# Patient Record
Sex: Male | Born: 1935 | Race: White | Hispanic: No | Smoking: Former smoker
Health system: Southern US, Community
[De-identification: ages and names within clinical notes are randomized; demographics above are authoritative.]

## PROBLEM LIST (undated history)

## (undated) DIAGNOSIS — I252 Old myocardial infarction: Secondary | ICD-10-CM

## (undated) DIAGNOSIS — I679 Cerebrovascular disease, unspecified: Secondary | ICD-10-CM

## (undated) DIAGNOSIS — I10 Essential (primary) hypertension: Secondary | ICD-10-CM

## (undated) DIAGNOSIS — Z720 Tobacco use: Secondary | ICD-10-CM

## (undated) DIAGNOSIS — F1011 Alcohol abuse, in remission: Secondary | ICD-10-CM

## (undated) DIAGNOSIS — E039 Hypothyroidism, unspecified: Secondary | ICD-10-CM

## (undated) DIAGNOSIS — E785 Hyperlipidemia, unspecified: Secondary | ICD-10-CM

## (undated) DIAGNOSIS — F0390 Unspecified dementia without behavioral disturbance: Secondary | ICD-10-CM

## (undated) DIAGNOSIS — I2510 Atherosclerotic heart disease of native coronary artery without angina pectoris: Secondary | ICD-10-CM

## (undated) DIAGNOSIS — R339 Retention of urine, unspecified: Secondary | ICD-10-CM

## (undated) DIAGNOSIS — R972 Elevated prostate specific antigen [PSA]: Secondary | ICD-10-CM

## (undated) DIAGNOSIS — J449 Chronic obstructive pulmonary disease, unspecified: Secondary | ICD-10-CM

## (undated) DIAGNOSIS — I739 Peripheral vascular disease, unspecified: Secondary | ICD-10-CM

## (undated) DIAGNOSIS — D649 Anemia, unspecified: Secondary | ICD-10-CM

## (undated) HISTORY — DX: Essential (primary) hypertension: I10

## (undated) HISTORY — DX: Alcohol abuse, in remission: F10.11

## (undated) HISTORY — PX: DENTAL SURGERY: SHX609

## (undated) HISTORY — PX: ULNAR NERVE REPAIR: SHX2594

## (undated) HISTORY — DX: Hypothyroidism, unspecified: E03.9

## (undated) HISTORY — DX: Cerebrovascular disease, unspecified: I67.9

## (undated) HISTORY — DX: Hyperlipidemia, unspecified: E78.5

## (undated) HISTORY — DX: Chronic obstructive pulmonary disease, unspecified: J44.9

## (undated) HISTORY — DX: Elevated prostate specific antigen (PSA): R97.20

## (undated) HISTORY — DX: Anemia, unspecified: D64.9

## (undated) HISTORY — DX: Atherosclerotic heart disease of native coronary artery without angina pectoris: I25.10

## (undated) HISTORY — PX: OTHER SURGICAL HISTORY: SHX169

## (undated) HISTORY — PX: NASAL SINUS SURGERY: SHX719

## (undated) HISTORY — DX: Tobacco use: Z72.0

---

## 1998-09-01 ENCOUNTER — Inpatient Hospital Stay (HOSPITAL_COMMUNITY): Admission: EM | Admit: 1998-09-01 | Discharge: 1998-09-05 | Payer: Self-pay | Admitting: Cardiovascular Disease

## 1998-09-19 ENCOUNTER — Encounter: Payer: Self-pay | Admitting: Cardiovascular Disease

## 2001-05-19 ENCOUNTER — Ambulatory Visit (HOSPITAL_COMMUNITY): Admission: RE | Admit: 2001-05-19 | Discharge: 2001-05-19 | Payer: Self-pay | Admitting: Cardiology

## 2004-01-11 ENCOUNTER — Ambulatory Visit (HOSPITAL_COMMUNITY): Admission: RE | Admit: 2004-01-11 | Discharge: 2004-01-11 | Payer: Self-pay | Admitting: Family Medicine

## 2004-02-21 ENCOUNTER — Ambulatory Visit (HOSPITAL_COMMUNITY): Admission: RE | Admit: 2004-02-21 | Discharge: 2004-02-21 | Payer: Self-pay | Admitting: Internal Medicine

## 2004-02-21 ENCOUNTER — Encounter (INDEPENDENT_AMBULATORY_CARE_PROVIDER_SITE_OTHER): Payer: Self-pay | Admitting: *Deleted

## 2004-02-21 ENCOUNTER — Ambulatory Visit: Payer: Self-pay | Admitting: Internal Medicine

## 2004-03-06 ENCOUNTER — Ambulatory Visit (HOSPITAL_COMMUNITY): Admission: RE | Admit: 2004-03-06 | Discharge: 2004-03-06 | Payer: Self-pay | Admitting: Internal Medicine

## 2004-04-18 ENCOUNTER — Ambulatory Visit: Payer: Self-pay | Admitting: Cardiology

## 2004-04-23 ENCOUNTER — Ambulatory Visit (HOSPITAL_COMMUNITY): Admission: RE | Admit: 2004-04-23 | Discharge: 2004-04-23 | Payer: Self-pay | Admitting: Family Medicine

## 2004-07-22 ENCOUNTER — Ambulatory Visit: Payer: Self-pay | Admitting: Cardiology

## 2005-08-14 ENCOUNTER — Ambulatory Visit: Payer: Self-pay | Admitting: Cardiology

## 2006-09-16 ENCOUNTER — Ambulatory Visit (HOSPITAL_COMMUNITY): Admission: RE | Admit: 2006-09-16 | Discharge: 2006-09-16 | Payer: Self-pay | Admitting: Cardiology

## 2006-09-16 ENCOUNTER — Ambulatory Visit: Payer: Self-pay | Admitting: Cardiology

## 2007-08-09 ENCOUNTER — Encounter (INDEPENDENT_AMBULATORY_CARE_PROVIDER_SITE_OTHER): Payer: Self-pay | Admitting: Urology

## 2007-10-18 ENCOUNTER — Ambulatory Visit: Payer: Self-pay | Admitting: Cardiology

## 2008-07-18 DIAGNOSIS — E039 Hypothyroidism, unspecified: Secondary | ICD-10-CM | POA: Insufficient documentation

## 2008-07-27 ENCOUNTER — Encounter (INDEPENDENT_AMBULATORY_CARE_PROVIDER_SITE_OTHER): Payer: Self-pay | Admitting: *Deleted

## 2008-07-27 LAB — CONVERTED CEMR LAB
ALT: 9 units/L
BUN: 15 mg/dL
CO2: 25 meq/L
Calcium: 9.9 mg/dL
Chloride: 101 meq/L
Creatinine, Ser: 1 mg/dL
Potassium: 5 meq/L
Total Protein: 7.1 g/dL

## 2008-09-25 ENCOUNTER — Encounter (INDEPENDENT_AMBULATORY_CARE_PROVIDER_SITE_OTHER): Payer: Self-pay | Admitting: *Deleted

## 2008-10-20 ENCOUNTER — Encounter: Payer: Self-pay | Admitting: Adult Health

## 2008-10-20 ENCOUNTER — Ambulatory Visit: Payer: Self-pay | Admitting: Cardiovascular Disease

## 2008-10-20 ENCOUNTER — Encounter (INDEPENDENT_AMBULATORY_CARE_PROVIDER_SITE_OTHER): Payer: Self-pay | Admitting: *Deleted

## 2008-11-02 ENCOUNTER — Ambulatory Visit (HOSPITAL_COMMUNITY): Admission: RE | Admit: 2008-11-02 | Discharge: 2008-11-02 | Payer: Self-pay | Admitting: Cardiovascular Disease

## 2008-11-02 ENCOUNTER — Ambulatory Visit: Payer: Self-pay | Admitting: Cardiology

## 2008-11-02 ENCOUNTER — Encounter: Payer: Self-pay | Admitting: Cardiovascular Disease

## 2008-11-10 ENCOUNTER — Ambulatory Visit: Payer: Self-pay | Admitting: Cardiology

## 2008-11-10 DIAGNOSIS — J4489 Other specified chronic obstructive pulmonary disease: Secondary | ICD-10-CM | POA: Insufficient documentation

## 2008-11-10 DIAGNOSIS — J449 Chronic obstructive pulmonary disease, unspecified: Secondary | ICD-10-CM | POA: Insufficient documentation

## 2008-11-15 ENCOUNTER — Encounter (INDEPENDENT_AMBULATORY_CARE_PROVIDER_SITE_OTHER): Payer: Self-pay | Admitting: *Deleted

## 2008-11-17 ENCOUNTER — Ambulatory Visit (HOSPITAL_COMMUNITY): Admission: RE | Admit: 2008-11-17 | Discharge: 2008-11-17 | Payer: Self-pay | Admitting: Cardiology

## 2008-11-20 ENCOUNTER — Encounter: Payer: Self-pay | Admitting: Cardiology

## 2008-12-11 ENCOUNTER — Ambulatory Visit: Payer: Self-pay | Admitting: Cardiology

## 2009-03-27 ENCOUNTER — Encounter (INDEPENDENT_AMBULATORY_CARE_PROVIDER_SITE_OTHER): Payer: Self-pay | Admitting: *Deleted

## 2009-03-27 LAB — CONVERTED CEMR LAB
Alkaline Phosphatase: 69 units/L
BUN: 18 mg/dL
CO2: 26 meq/L
Chloride: 95 meq/L
Cholesterol: 126 mg/dL
Creatinine, Ser: 1.07 mg/dL
HDL: 33 mg/dL
Hgb A1c MFr Bld: 134 %
LDL Cholesterol: 77 mg/dL
Total Protein: 7.5 g/dL

## 2009-04-09 ENCOUNTER — Encounter (INDEPENDENT_AMBULATORY_CARE_PROVIDER_SITE_OTHER): Payer: Self-pay | Admitting: *Deleted

## 2009-05-23 ENCOUNTER — Encounter (INDEPENDENT_AMBULATORY_CARE_PROVIDER_SITE_OTHER): Payer: Self-pay | Admitting: *Deleted

## 2009-06-26 ENCOUNTER — Ambulatory Visit: Payer: Self-pay | Admitting: Gastroenterology

## 2009-07-19 ENCOUNTER — Telehealth: Payer: Self-pay | Admitting: Gastroenterology

## 2009-07-24 ENCOUNTER — Encounter: Payer: Self-pay | Admitting: Gastroenterology

## 2009-12-05 ENCOUNTER — Encounter (INDEPENDENT_AMBULATORY_CARE_PROVIDER_SITE_OTHER): Payer: Self-pay

## 2009-12-05 LAB — CONVERTED CEMR LAB
Albumin: 4.5 g/dL
BUN: 13 mg/dL
CO2: 28 meq/L
Calcium: 9.6 mg/dL
Chloride: 96 meq/L
Glucose, Bld: 103 mg/dL
HCT: 37.7 %
Hemoglobin: 11.9 g/dL
Hgb A1c MFr Bld: 6.1 %
MCV: 97.9 fL
Potassium: 5.6 meq/L
Sodium: 134 meq/L
TSH: 1.259 microintl units/mL
Total Protein: 7.5 g/dL

## 2010-01-08 ENCOUNTER — Encounter (INDEPENDENT_AMBULATORY_CARE_PROVIDER_SITE_OTHER): Payer: Self-pay | Admitting: *Deleted

## 2010-01-09 ENCOUNTER — Ambulatory Visit: Payer: Self-pay | Admitting: Cardiology

## 2010-01-09 ENCOUNTER — Encounter (INDEPENDENT_AMBULATORY_CARE_PROVIDER_SITE_OTHER): Payer: Self-pay | Admitting: *Deleted

## 2010-01-09 DIAGNOSIS — E785 Hyperlipidemia, unspecified: Secondary | ICD-10-CM

## 2010-01-10 ENCOUNTER — Encounter: Payer: Self-pay | Admitting: Cardiology

## 2010-01-10 ENCOUNTER — Telehealth (INDEPENDENT_AMBULATORY_CARE_PROVIDER_SITE_OTHER): Payer: Self-pay

## 2010-02-04 ENCOUNTER — Encounter: Payer: Self-pay | Admitting: Cardiology

## 2010-02-04 ENCOUNTER — Ambulatory Visit
Admission: RE | Admit: 2010-02-04 | Discharge: 2010-02-04 | Payer: Self-pay | Source: Home / Self Care | Attending: Cardiology | Admitting: Cardiology

## 2010-02-06 ENCOUNTER — Encounter: Payer: Self-pay | Admitting: Cardiology

## 2010-02-12 ENCOUNTER — Encounter (INDEPENDENT_AMBULATORY_CARE_PROVIDER_SITE_OTHER): Payer: Self-pay | Admitting: *Deleted

## 2010-02-12 LAB — CONVERTED CEMR LAB
ALT: 9 units/L (ref 0–53)
AST: 21 units/L (ref 0–37)
Albumin: 4.3 g/dL (ref 3.5–5.2)
Alkaline Phosphatase: 49 units/L (ref 39–117)
BUN: 20 mg/dL (ref 6–23)
Chloride: 93 meq/L — ABNORMAL LOW (ref 96–112)
Potassium: 4.8 meq/L (ref 3.5–5.3)
Sodium: 130 meq/L — ABNORMAL LOW (ref 135–145)
Total Protein: 7 g/dL (ref 6.0–8.3)

## 2010-02-14 NOTE — Consult Note (Signed)
Summary: [facility]  [facility]   Imported By: Lester [place] 07/31/2009 09:59:14  _____________________________________________________________________  External Attachment:    Type:   Image     Comment:   External Document

## 2010-02-14 NOTE — Miscellaneous (Signed)
Summary: labs cmp,lipid,A1c,03/27/2009  Clinical Lists Changes  Observations: Added new observation of CALCIUM: 9.5 mg/dL (33/29/5188 41:66) Added new observation of ALBUMIN: 4.5 g/dL (07/13/1599 09:32) Added new observation of PROTEIN, TOT: 7.5 g/dL (35/57/3220 25:42) Added new observation of SGPT (ALT): <8 units/L (03/27/2009 15:03) Added new observation of SGOT (AST): 16 units/L (03/27/2009 15:03) Added new observation of ALK PHOS: 69 units/L (03/27/2009 15:03) Added new observation of CREATININE: 1.07 mg/dL (70/62/3762 83:15) Added new observation of BUN: 18 mg/dL (17/61/6073 71:06) Added new observation of BG RANDOM: 100 mg/dL (26/94/8546 27:03) Added new observation of CO2 PLSM/SER: 26 meq/L (03/27/2009 15:03) Added new observation of CL SERUM: 95 meq/L (03/27/2009 15:03) Added new observation of K SERUM: 4.7 meq/L (03/27/2009 15:03) Added new observation of NA: 135 meq/L (03/27/2009 15:03) Added new observation of LDL: 77 mg/dL (50/09/3816 29:93) Added new observation of HDL: 33 mg/dL (71/69/6789 38:10) Added new observation of TRIGLYC TOT: 82 mg/dL (17/51/0258 52:77) Added new observation of CHOLESTEROL: 126 mg/dL (82/42/3536 14:43) Added new observation of HGBA1C: 134 % (03/27/2009 15:03)

## 2010-02-14 NOTE — Assessment & Plan Note (Signed)
History of Present Illness Visit Type: Initial Visit Primary GI MD: Rob Bunting MD Primary Provider: John Giovanni, MD Requesting Provider: self Chief Complaint:  build up os saliva History of Present Illness:     very pleasant 77 year old man who has had trouble with excess sailva production for several months.  ENT physician were contacted, without help.  He has a poor appetite.  The inside of his jaw is sore, wet, he has to spit too much, every day.  Saliva will run out the side about it.  Mr. Chaloux has a friend from church who is one of my patients who had issues with hypersalivation and this friend told him to come see me.  He has tried amytriptlyine and this caused "dry mouth" but still had a problem with his cheek, still getting saliva.  This all started with dental procedures.             Current Medications (verified): 1)  Proventil Hfa 108 (90 Base) Mcg/act Aers (Albuterol Sulfate) 2)  Gemfibrozil 600 Mg Tabs (Gemfibrozil) .... Take 1 Tab Two Times A Day 3)  Levothyroxine Sodium 100 Mcg Tabs (Levothyroxine Sodium) .... Take 1 Tab Daily 4)  Tolbutamide 500 Mg Tabs (Tolbutamide) .... 1/2 Tab Daily 5)  Metoprolol Tartrate 25 Mg Tabs (Metoprolol Tartrate) .... Take 1 Tab Two Times A Day 6)  Pravastatin Sodium 40 Mg Tabs (Pravastatin Sodium) .... Take 1 Tab Daily 7)  Diazepam 10 Mg Tabs (Diazepam) .... Take As Needed 8)  Aspir-Low 81 Mg Tbec (Aspirin) .... Take 1 Tab Daily 9)  Hydrocodone-Acetaminophen 7.5-750 Mg Tabs (Hydrocodone-Acetaminophen) .... Take As Needed 10)  Nitrostat 0.4 Mg Subl (Nitroglycerin) .... Take As Needed For Chest Pain 11)  Lisinopril 20 Mg Tabs (Lisinopril) .... Take 1 Tablet By Mouth Once Daily 12)  Niacin 500 Mg Tabs (Niacin) .... Take 2 Tablets Daily 13)  Fish Oil 1000 Mg Caps (Omega-3 Fatty Acids) .... Take 2 Caps Daily  Allergies (verified): No Known Drug Allergies  Past History:  Past Medical History: ASCVD: Inferior myocardial  infarction in 08/1998 requiring PCI of the RCA; moderate residual disease in the      left anterior descending and first diagonal; ejection fraction of 45% Tobacco abuse/chronic obstructive pulmonary disease: Consumption tapered to 2 packs per week DIABETES MELLITUS (ICD-250.00): A1c-7.7 in 2004 with diet-controlled; 6.3 and 11/08 on oral medication HYPOTHYROIDISM (ICD-244.9) DYSLIPOPROTEINEMIA (ICD-272.5) HYPERTENSION, UNSPECIFIED (ICD-401.9) Cerebrovascular disease: Right carotid bruit; plaque without stenosis in 2003 and 2005 History of excessive alcohol use  Asthma     Past Surgical History: nasal surgery Surgical procedure on the right arm to correct a neurologic abnormality several dental proceduresRight jaw  Family History: Family History of Coronary Artery Disease:  Family History of Diabetes:  Family History of Hypertension:  alzheimers   Social History: Retired  Married  Tobacco Use - Yes.  Alcohol Use - no Regular Exercise - no Drug Use - no   Review of Systems       Pertinent positive and negative review of systems were noted in the above HPI and GI specific review of systems.  All other review of systems was otherwise negative.   Vital Signs:  Patient profile:   77 year old male Height:      71 inches Weight:      168.38 pounds BMI:     23.57 Pulse rate:   60 / minute Pulse rhythm:   regular BP sitting:   132 / 64  (right arm) Cuff  size:   regular  Vitals Entered By: June McMurray CMA Duncan Dull) (June 26, 2009 2:02 PM)  Physical Exam  Additional Exam:  Constitutional: generally well appearing Psychiatric: alert and oriented times 3 Eyes: extraocular movements intact Mouth: oropharynx moist, no lesions: No clear masses or lesions along the right jaw line, right cheek Neck: supple, no lymphadenopathy Cardiovascular: heart regular rate and rythm Lungs: CTA bilaterally Abdomen: soft, non-tender, non-distended, no obvious ascites, no peritoneal signs,  normal bowel sounds Extremities: no lower extremity edema bilaterally Skin: no lesions on visible extremities    Impression & Recommendations:  Problem # 1:  hypersalivation unclear etiology and this is generally out of my area of expertise. I think a trial of scopolamine transdermal patch is reasonable. He knows to change it every 3 days. He has been evaluated by dentist in the past and it sounds like his symptoms started shortly after one of his dental procedures. I think it scopolamine patch is not helpful then I would arrange for him to be evaluated by ear nose and throat.  Patient Instructions: 1)  Trial of scopalamine patch, change this every 3 days. 2)  If this is not helpful, then ENT evaluation is warranted. 3)  Call Dr. Christella Hartigan in 4-5 weeks to report on how you are doing. 4)  A copy of this information will be sent to Dr. Sudie Bailey. 5)  The medication list was reviewed and reconciled.  All changed / newly prescribed medications were explained.  A complete medication list was provided to the patient / caregiver. Prescriptions: TRANSDERM-SCOP 1.5 MG PT72 (SCOPOLAMINE BASE) place one patch behind ear, change every 3 days  #10 x 2   Entered and Authorized by:   Rachael Fee MD   Signed by:   Rachael Fee MD on 06/26/2009   Method used:   Electronically to        [facility] [phone]* (retail)       [address]       [place], [place]  [address]       Ph: [phone] or [phone]       Fax: [phone]   RxID:   [id number]

## 2010-02-14 NOTE — Letter (Signed)
Summary: [facility] at [employer]  [address], [place] [address]   Phone: [phone]  Fax: [phone]     January 09, 2010 MRN: [id number]   [name] [address] [place], [place]  [address]      YOUR LAB WORK IS DUE   February 11, 2010  Please go to [facility], located across the street from [facility] on the second floor.  Hours are Monday - Friday 7am until 7:30pm         Saturday 8am until 12noon    __  DO NOT EAT OR DRINK AFTER MIDNIGHT EVENING PRIOR TO LABWORK  _x_ YOUR LABWORK IS NOT FASTING --YOU MAY EAT PRIOR TO LABWORK

## 2010-02-14 NOTE — Progress Notes (Signed)
Summary: Medication Questions  Phone Note Call from Patient   Caller: Patient Reason for Call: Talk to Nurse Summary of Call: patient has questions regarding medications from yesterday's office visit / tg Initial call taken by: Raechel Ache [facility],  January 10, 2010 10:30 AM  Follow-up for Phone Call        Pt. found his physician recommendations sheet that was given to him at yesterday's OV and no longer had any questions requarding his medications. Pt. advised to call office if he has any other questions. Follow-up by: Larita Fife Via LPN,  January 10, 2010 11:20 AM

## 2010-02-14 NOTE — Letter (Signed)
Summary: New Patient letter  [facility]  [address] [place], [place] [address]   Phone: [phone]  Fax: [phone]       05/23/2009 MRN: [id number]  [name] [address] [place], [place]  [address]  Dear [name],  Welcome to the Gastroenterology Division at [facility].    You are scheduled to see Dr.  Christella Hartigan on 06-26-09 at 2:30p.m. on the 3rd floor at [facility], [address]  We ask that you try to arrive at our office 15 minutes prior to your appointment time to allow for check-in.  We would like you to complete the enclosed self-administered evaluation form prior to your visit and bring it with you on the day of your appointment.  We will review it with you.  Also, please bring a complete list of all your medications or, if you prefer, bring the medication bottles and we will list them.  Please bring your insurance card so that we may make a copy of it.  If your insurance requires a referral to see a specialist, please bring your referral form from your primary care physician.  Co-payments are due at the time of your visit and may be paid by cash, check or credit card.     Your office visit will consist of a consult with your physician (includes a physical exam), any laboratory testing he/she may order, scheduling of any necessary diagnostic testing (e.g. x-ray, ultrasound, CT-scan), and scheduling of a procedure (e.g. Endoscopy, Colonoscopy) if required.  Please allow enough time on your schedule to allow for any/all of these possibilities.    If you cannot keep your appointment, please call [phone] to cancel or reschedule prior to your appointment date.  This allows [place] the opportunity to schedule an appointment for another patient in need of care.  If you do not cancel or reschedule by 5 p.m. the business day prior to your appointment date, you will be charged a $50.00 late cancellation/no-show fee.    Thank you for choosing [facility] for your medical needs.  We appreciate the opportunity to care for you.  Please visit [place] at our website  to learn more about our practice.                     Sincerely,                                                             The Gastroenterology Division

## 2010-02-14 NOTE — Miscellaneous (Signed)
Summary: CBC, CMP, Lipids, HGB A1c, and TSH  Clinical Lists Changes  Observations: Added new observation of CALCIUM: 9.6 mg/dL (16/10/9602 54:09) Added new observation of ALBUMIN: 4.5 g/dL (81/19/1478 29:56) Added new observation of PROTEIN, TOT: 7.5 g/dL (21/30/8657 84:69) Added new observation of SGPT (ALT): 8 units/L (12/05/2009 13:00) Added new observation of SGOT (AST): 16 units/L (12/05/2009 13:00) Added new observation of ALK PHOS: 63 units/L (12/05/2009 13:00) Added new observation of BILI DIRECT: Bili Total: 0.4 mg/dL (62/95/2841 32:44) Added new observation of CREATININE: 1.02 mg/dL (01/15/7251 66:44) Added new observation of BUN: 13 mg/dL (03/47/4259 56:38) Added new observation of BG RANDOM: 103 mg/dL (75/64/3329 51:88) Added new observation of CO2 PLSM/SER: 28 meq/L (12/05/2009 13:00) Added new observation of CL SERUM: 96 meq/L (12/05/2009 13:00) Added new observation of K SERUM: 5.6 meq/L (12/05/2009 13:00) Added new observation of NA: 134 meq/L (12/05/2009 13:00) Added new observation of LDL: 71 mg/dL (41/66/0630 16:01) Added new observation of HDL: 36 mg/dL (09/32/3557 32:20) Added new observation of TRIGLYC TOT: 68 mg/dL (25/42/7062 37:62) Added new observation of CHOLESTEROL: 121 mg/dL (83/15/1761 60:73) Added new observation of PLATELETK/UL: 396 K/uL (12/05/2009 13:00) Added new observation of MCV: 97.9 fL (12/05/2009 13:00) Added new observation of HCT: 37.7 % (12/05/2009 13:00) Added new observation of HGB: 11.9 g/dL (71/06/2692 85:46) Added new observation of WBC COUNT: 9.3 10*3/microliter (12/05/2009 13:00) Added new observation of TSH: 1.259 microintl units/mL (12/05/2009 13:00) Added new observation of HGBA1C: 6.1 % (12/05/2009 13:00)

## 2010-02-14 NOTE — Assessment & Plan Note (Signed)
Summary: 1 yr f/u per checkout on 11/10/08/tg   Primary Provider:  John Giovanni, MD   History of Present Illness: [name] returns to the office for continued assessment and treatment of coronary artery disease, multiple cardiovascular risk factors, cerebrovascular disease and hyperlipidemia.  Since last visit, he has done generally well.  He has not required urgent medical care or developed any new medical problems.  He has been evaluated by ENT for excessive salivation without any specific diagnosis or treatment for same.  As a result of a recent moderately elevated PSA level, he is to be reevaluated by his urologist near future.  He previously underwent prostate biopsy with negative results.  Current Medications (verified): 1)  Proventil Hfa 108 (90 Base) Mcg/act Aers (Albuterol Sulfate) 2)  Gemfibrozil 600 Mg Tabs (Gemfibrozil) .... Take 1 Tab Two Times A Day 3)  Levothyroxine Sodium 100 Mcg Tabs (Levothyroxine Sodium) .... Take 1 Tab Daily 4)  Tolbutamide 500 Mg Tabs (Tolbutamide) .... 1/2 Tab Daily 5)  Metoprolol Tartrate 25 Mg Tabs (Metoprolol Tartrate) .... Take 1 Tab Two Times A Day 6)  Pravastatin Sodium 40 Mg Tabs (Pravastatin Sodium) .... Take 1 Tab Daily 7)  Diazepam 10 Mg Tabs (Diazepam) .... Take As Needed 8)  Aspir-Low 81 Mg Tbec (Aspirin) .... Take 1 Tab Daily 9)  Hydrocodone-Acetaminophen 7.5-750 Mg Tabs (Hydrocodone-Acetaminophen) .... Take As Needed 10)  Nitrostat 0.4 Mg Subl (Nitroglycerin) .... Take As Needed For Chest Pain 11)  Lisinopril-Hydrochlorothiazide 20-12.5 Mg Tabs (Lisinopril-Hydrochlorothiazide) .... Take 1 Tablet By Mouth Once A Day 12)  Niacin 500 Mg Tabs (Niacin) .... Take 2 Tablets Daily 13)  Fish Oil 1000 Mg Caps (Omega-3 Fatty Acids) .... Take 2 Caps Daily 14)  Amlodipine Besylate 5 Mg Tabs (Amlodipine Besylate) .... Take One Tablet By Mouth Daily  Allergies (verified): No Known Drug Allergies  Comments:  Nurse/Medical  Assistant: patient brought med list he uses [facility] mail order  Past History:  PMH, FH, and Social History reviewed and updated.  Past Medical History: ASCVD: Inferior myocardial infarction in 08/1998 requiring PCI of the RCA; moderate residual disease in the      left anterior descending and first diagonal; ejection fraction of 45% Tobacco abuse/chronic obstructive pulmonary disease: Consumption tapered to 2 packs per week DIABETES MELLITUS (ICD-250.00): A1c-7.7 in 2004 with diet-controlled; 6.3 and 11/08 on oral medication Hyperlipidemia HYPERTENSION, UNSPECIFIED (ICD-401.9) Cerebrovascular disease: Right carotid bruit; plaque without stenosis in 2003 and 2005 History of excessive alcohol use  Hypothyroid Asthma Elevated PSA-prior negative prostate biopsy     Review of Systems  The patient denies weight loss, weight gain, hoarseness, chest pain, syncope, dyspnea on exertion, peripheral edema, prolonged cough, headaches, and abdominal pain.    Vital Signs:  Patient profile:   77 year old male Weight:      167 pounds BMI:     23.38 O2 Sat:      96 % on Room air Pulse rate:   74 / minute BP sitting:   184 / 69  (left arm)  Vitals Entered By: Dreama Saa, CNA (January 09, 2010 2:25 PM)  O2 Flow:  Room air  Physical Exam  General:  Proportionate weight and height; well developed; no acute distress:   Neck-No JVD; right carotid bruits: Lungs-No tachypnea, no rales; no rhonchi; no wheezes: Cardiovascular-normal PMI; normal S1 and S2; initial rhythm irregularity; subsequently normalized Abdomen-BS normal; soft and non-tender without masses or organomegaly:  Musculoskeletal-No deformities, no cyanosis or clubbing: Neurologic-Normal cranial nerves; symmetric  strength and tone:  Skin-Warm, no significant lesions: Extremities-Nl distal pulses; trace edema:     Impression & Recommendations:  Problem # 1:  ATHEROSCLEROTIC CARDIOVASCULAR DISEASE (ICD-429.2) Patient has no  symptoms to suggest recurrent myocardial ischemia.  Problem # 2:  HYPERTENSION (ICD-401.9) Blood pressure control is inadequate.  Hydrochlorothiazide 12.5 mg q.d. will be added to his lisinopril dose.  Amlodipine 5 mg q.d. will be added as well.  Patient will continue to monitor blood pressure at home and return for a visit with the cardiology nurses and one month.  BP today: 184/69 Prior BP: 132/64 (06/26/2009)  Labs Reviewed: K+: 5.6 (12/05/2009) Creat: : 1.02 (12/05/2009)   Chol: 121 (12/05/2009)   HDL: 36 (12/05/2009)   LDL: 71 (12/05/2009)   TG: 68 (12/05/2009)  Problem # 3:  HYPERLIPIDEMIA (ICD-272.4) Recent lipid profile was excellent; current therapy will be continued.  CHOL: 121 (12/05/2009)   LDL: 71 (12/05/2009)   HDL: 36 (12/05/2009)   TG: 68 (12/05/2009)  Problem # 4:  CEREBROVASCULAR DISEASE (ICD-437.9) Carotid ultrasound in 2010 revealed no significant focal disease.  Thyroid nodules were incidentally noted.  Dr. Sudie Bailey may wish to perform further testing of the thyroid.  Other Orders: Future Orders: T-Comprehensive Metabolic Panel ([id number]) ... 02/11/2010  EKG  Procedure date:  01/09/2010  Findings:      Rhythm Strip  Sinus rhythm; frequent pauses measuring 1.2 seconds, possibly representing sinus arrest or blocked PACs.  EKG  Normal sinus rhythm First degree AV block Left axis Right bundle branch block No previous tracing for comparison    Patient Instructions: 1)  Your physician recommends that you schedule a follow-up appointment in: 1 year 2)  Your physician recommends that you return for lab work in: 1 month 3)  Your physician has recommended you make the following change in your medication: change lisinopril to lisinopril HCT 20/12.5.  Added  Norvasc 5mg  every day. 4)  You have been referred to a nurse visit in 1 month and return your BP diary at visit. 5)  Your physician has requested that you regularly monitor and record your blood  pressure readings at home.  Please use the same machine at the same time of day to check your readings and record them to bring to your follow-up visit. Prescriptions: AMLODIPINE BESYLATE 5 MG TABS (AMLODIPINE BESYLATE) Take one tablet by mouth daily  #30 x 0   Entered by:   Fuller Plan CMA   Authorized by:   Kathlen Brunswick, MD, [facility]   Signed by:   Fuller Plan CMA on 01/09/2010   Method used:   Faxed to ...       [facility] [address] (retail)       [address]       [place], [place]  [address]       Ph: [phone]       Fax: [phone]   RxID:   [id number] LISINOPRIL-HYDROCHLOROTHIAZIDE 20-12.5 MG TABS (LISINOPRIL-HYDROCHLOROTHIAZIDE) Take 1 tablet by mouth once a day  #90 x 3   Entered by:   Fuller Plan CMA   Authorized by:   Kathlen Brunswick, MD, [facility]   Signed by:   Fuller Plan CMA on 01/09/2010   Method used:   Faxed to ...       [facility] (mail-order)             , [place]         Ph: [phone]       Fax: [phone]   RxID:  [id number]  

## 2010-02-14 NOTE — Progress Notes (Signed)
Summary: Triage  Phone Note Call from Patient Call back at Home Phone [phone]   Caller: Patient Call For: Dr. Christella Hartigan Reason for Call: Talk to Nurse Summary of Call: pt. has alot of excessive Saliva in his mouth and wants a referral to an ENT Initial call taken by: Karna Christmas,  July 19, 2009 2:45 PM  Follow-up for Phone Call        pt would like appt with [facility].   appt made and given to pt Follow-up by: Chales Abrahams CMA Duncan Dull),  July 19, 2009 2:52 PM  New Problems: DISTURBANCE OF SALIVARY SECRETION (ICD-527.7)   New Problems: DISTURBANCE OF SALIVARY SECRETION (ICD-527.7)

## 2010-02-14 NOTE — Letter (Signed)
Summary: BP LOG  BP LOG   Imported By: Faythe Ghee 02/04/2010 16:43:55  _____________________________________________________________________  External Attachment:    Type:   Image     Comment:   External Document

## 2010-02-14 NOTE — Miscellaneous (Signed)
Summary: CXR 11/17/2008,CAROTIDS 11/17/2008  Clinical Lists Changes  Observations: Added new observation of CXR RESULTS:   Clinical Data: Cerebral vascular disease, carotid bruit    CHEST - 2 VIEW    Comparison: 09/16/2006    Findings:   Mild cardiac enlargement.   Coronary arterial stent identified along right heart margin on PA   view, question right coronary system.   Mediastinal contours and pulmonary vascularity normal.   Mild atherosclerotic calcification of the tortuous thoracic aorta.   Emphysematous and bronchitic changes compatible with COPD.   No acute infiltrate, pleural effusion, or pneumothorax.   Bones appear slightly demineralized.   Minor endplate spur formation at multiple levels of the thoracic   spine.    IMPRESSION:   Mild cardiac enlargement with noted coronary arterial stent.   COPD.   No acute abnormalities.    Read By:  Lollie Marrow,  M.D.   Released By:  Lollie Marrow,  M.D.  Additional Information  HL7 RESULT STATUS : F  External image : [id number]  External IF Update Timestamp : 2008-11-17:10:34:24.000000  (11/17/2008 16:58) Added new observation of US CAROTID:  Findings:    RIGHT CAROTID ARTERY: Scattered plaque throughout right CCA,   carotid bulb, right ICA and right ECA, greatest at origin of right   ICA.  Turbulent flow in proximal right ICA and ECA.  Spectral   broadening right ICA.  Some areas of shadowing are seen from   calcified plaque in the proximal right ICA on color Doppler   imaging. No high velocity jets.    RIGHT VERTEBRAL ARTERY:  Patent, antegrade    LEFT CAROTID ARTERY: Plaque formation throughout the left carotid   system including the CCA, carotid bulb, ICA and ECA.  Portions of   the left side plaque are calcified and shadowing.  Turbulent flow   identified minimally at the left carotid bulb.  Fairly laminar flow   on color Doppler imaging in the left ICA, though spectral   broadening is seen in the left ICA  on waveform analysis. No high   velocity jets    LEFT VERTEBRAL ARTERY:  Patent, antegrade    Other findings:  Nodules are incidentally noted bilaterally in the   thyroid lobes, inadequately assessed on this exam.  A nodule in the   right lobe measures at least 1.4 cm in greatest size, and a lesion   in the left lobe measures at least 10 mm.    IMPRESSION:   Extensive plaque formation bilaterally in the carotid systems   without elevated velocities to suggest hemodynamically significant   stenosis.   Bilateral thyroid nodules, incompletely assessed on this exam,   recommend dedicated thyroid sonography to characterize these   lesions.    Read By:  Lollie Marrow,  M.D. (11/17/2008 16:58)      Carotid Doppler  Procedure date:  11/17/2008  Findings:       Findings:    RIGHT CAROTID ARTERY: Scattered plaque throughout right CCA,   carotid bulb, right ICA and right ECA, greatest at origin of right   ICA.  Turbulent flow in proximal right ICA and ECA.  Spectral   broadening right ICA.  Some areas of shadowing are seen from   calcified plaque in the proximal right ICA on color Doppler   imaging. No high velocity jets.    RIGHT VERTEBRAL ARTERY:  Patent, antegrade    LEFT CAROTID ARTERY: Plaque formation throughout the left carotid   system including  the CCA, carotid bulb, ICA and ECA.  Portions of   the left side plaque are calcified and shadowing.  Turbulent flow   identified minimally at the left carotid bulb.  Fairly laminar flow   on color Doppler imaging in the left ICA, though spectral   broadening is seen in the left ICA on waveform analysis. No high   velocity jets    LEFT VERTEBRAL ARTERY:  Patent, antegrade    Other findings:  Nodules are incidentally noted bilaterally in the   thyroid lobes, inadequately assessed on this exam.  A nodule in the   right lobe measures at least 1.4 cm in greatest size, and a lesion   in the left lobe measures at least 10 mm.     IMPRESSION:   Extensive plaque formation bilaterally in the carotid systems   without elevated velocities to suggest hemodynamically significant   stenosis.   Bilateral thyroid nodules, incompletely assessed on this exam,   recommend dedicated thyroid sonography to characterize these   lesions.    Read By:  Lollie Marrow,  M.D.  CXR  Procedure date:  11/17/2008  Findings:        Clinical Data: Cerebral vascular disease, carotid bruit    CHEST - 2 VIEW    Comparison: 09/16/2006    Findings:   Mild cardiac enlargement.   Coronary arterial stent identified along right heart margin on PA   view, question right coronary system.   Mediastinal contours and pulmonary vascularity normal.   Mild atherosclerotic calcification of the tortuous thoracic aorta.   Emphysematous and bronchitic changes compatible with COPD.   No acute infiltrate, pleural effusion, or pneumothorax.   Bones appear slightly demineralized.   Minor endplate spur formation at multiple levels of the thoracic   spine.    IMPRESSION:   Mild cardiac enlargement with noted coronary arterial stent.   COPD.   No acute abnormalities.    Read By:  Lollie Marrow,  M.D.   Released By:  Lollie Marrow,  M.D.  Additional Information  HL7 RESULT STATUS : F  External image : [id number]  External IF Update Timestamp : 2008-11-17:10:34:24.000000

## 2010-02-14 NOTE — Procedures (Signed)
Summary: Incomplete Colon   Colonoscopy  Procedure date:  02/21/2004  Findings:      Location:  [facility]   NAME:  [name], [name]                ACCOUNT NO.:  [id number]   MEDICAL RECORD NO.:  [id number]          PATIENT TYPE:  AMB   LOCATION:  DAY                           FACILITY:  [facility]   PHYSICIAN:  Lionel December, M.D.    DATE OF BIRTH:  10-11-1933   DATE OF PROCEDURE:  02/21/2004  DATE OF DISCHARGE:                                 OPERATIVE REPORT   PROCEDURE:  Attempted colonoscopy which was incomplete.   INDICATIONS:  [name] is a 77 year old Caucasian male who is here for  screening colonoscopy. He has intermittent constipation felt to be secondary  to his medications. He also has hematochezia thought to be secondary to  hemorrhoids which only occurs when he is constipated and has strain.  Procedure risks were reviewed the patient, and informed consent was  obtained.   PREMEDICATION:  Demerol 25 mg IV, Versed 6 mg IV in divided dose.   FINDINGS:  Procedure performed in endoscopy suite. The patient's vital signs  and O2 sat were monitored during procedure remained stable. The patient was  placed left lateral position. Rectal examination performed. No abnormality  noted external or digital exam. Olympus videoscope was placed rectum and  advanced under vision into sigmoid colon where scattered diverticula noted.  Very tortuous noncompliant sigmoid colon. Was not able to advance the scope  into proximal sigmoid colon and descending colon. Scope was pulled back to  the rectum. The patient was the repositioned in supine position, as well as  on the right side but without any success. Therefore, endoscope was  withdrawn. Part of the sigmoid colon that was examined did not reveal any  other abnormalities except diverticula. Rectal mucosa was normal. Scope was  retroflexed to examine anorectal junction which was unremarkable. Endoscope  was then withdrawn. The  patient tolerated the procedure well.   FINAL DIAGNOSIS:  Sigmoid colon diverticulosis with a tortuous noncompliant  sigmoid colon preventing complete exam.   RECOMMENDATIONS:  1.  He will return for barium enema in a couple of weeks.  2.  Citrucel 1 tablespoonful daily.  3.  If necessary, he can take Colace 2 tablets at bedtime as well.  4.  He will resume his usual diet and medications as before.      NR/MEDQ  D:  02/21/2004  T:  02/21/2004  Job:  [id number]

## 2010-02-20 NOTE — Assessment & Plan Note (Signed)
**Note De-Identified Shatima Zalar Obfuscation** Summary: 1 MTH NURSE VISIT PER CHECKOUT ON 01/09/10/TG  Nurse Visit   Vital Signs:  Patient profile:   77 year old male Weight:      170 pounds O2 Sat:      93 % on Room air Pulse rate:   66 / minute BP sitting:   137 / 62  (left arm)  Vitals Entered By: Larita Fife Aavya Shafer LPN (February 04, 2010 3:53 PM)  O2 Flow:  Room air  Primary Provider:  John Giovanni, MD   History of Present Illness: S: Pt. returns to office for 1 month BP check with nurse. B: On last OV with Dr. Dietrich Pates on 01-09-10 pt. was advised to stop taking Lisinopril and start taking Lisinopril/HCTZ 20-12.5mg  by mouth once daily and Amlodipine 5mg  by mouth once daily to better control HTN.  A: Pt. has no cardiac complaints. He states he is not taking Amlodipine due to damaged saliva gland in mouth swelling and he is not taking Metoprolol due to slowed HR. Pt. brought in meds and BP diary (scanned into chart). BP today is 137/62  P=66; on 01-09-10 BP was 184/69  p=74.  R: Pt. advised to contact us if he cant take his cardiac medications. Also, Pt. advised that we will contact him with Dr. Marvel Plan recommendations.  02/09/10 Amlodipine has nothing to do with salivary glands.  On what basis did patient decide to discontinue that drug? Why did patient decide his heart rate was too low?      [place] Bing, M.D.   Pt. states that when he took Amlodipine his damaged saliva gland would become more swollen and aggravated. Also,  Mr. Quezada states that he did not start keeping his BP diary until he stopped Metoprolol and that is the reason BP diary does not show those results.  Pt. has agreed  to start taking both Amlodipine and Metoprolol, to start a new BP diary, and to come back to office in 2 weeks for another BP check. BP check scheduled for 02-25-10  02/13/10 Sounds good.      [place] Bing, M.D.     Current Medications (verified): 1)  Proventil Hfa 108 (90 Base) Mcg/act Aers (Albuterol Sulfate) 2)  Gemfibrozil 600 Mg  Tabs (Gemfibrozil) .... Take 1 Tab Two Times A Day 3)  Levothyroxine Sodium 100 Mcg Tabs (Levothyroxine Sodium) .... Take 1 Tab Daily 4)  Tolbutamide 500 Mg Tabs (Tolbutamide) .... 1/2 Tab Daily 5)  Metoprolol Tartrate 25 Mg Tabs (Metoprolol Tartrate) .... Take 1 Tab Two Times A Day 6)  Pravastatin Sodium 40 Mg Tabs (Pravastatin Sodium) .... Take 1 Tab Daily 7)  Diazepam 10 Mg Tabs (Diazepam) .... Take As Needed 8)  Aspir-Low 81 Mg Tbec (Aspirin) .... Take 1 Tab Daily 9)  Hydrocodone-Acetaminophen 7.5-750 Mg Tabs (Hydrocodone-Acetaminophen) .... Take As Needed 10)  Nitrostat 0.4 Mg Subl (Nitroglycerin) .... Take As Needed For Chest Pain 11)  Lisinopril-Hydrochlorothiazide 20-12.5 Mg Tabs (Lisinopril-Hydrochlorothiazide) .... Take 1 Tablet By Mouth Once A Day 12)  Niacin 500 Mg Tabs (Niacin) .... Take 2 Tablets Daily 13)  Fish Oil 1000 Mg Caps (Omega-3 Fatty Acids) .... Take 2 Caps Daily 14)  Amlodipine Besylate 5 Mg Tabs (Amlodipine Besylate) .... Take One Tablet By Mouth Daily  Allergies (verified): No Known Drug Allergies

## 2010-02-20 NOTE — Letter (Signed)
Summary: [place] [employer] [facility], agricultural at [facility]  [address], [place] [address]   Phone: [phone]  Fax: [phone]      February 12, 2010 MRN: [id number]   [name] [address] [place], [place]  [address]   Dear Mr. [name],  Your test ordered by Selena Batten has been reviewed by your physician (or physician assistant) and was found to be normal or stable. Your physician (or physician assistant) felt no changes were needed at this time.  ____ Echocardiogram  ____ Cardiac Stress Test  __x__ Lab Work  ____ Peripheral vascular study of arms, legs or neck  ____ CT scan or X-ray  ____ Lung or Breathing test  ____ Other:  No change in medical treatment at this time, per Dr. Dietrich Pates.  Thank you, Vernestine Brodhead Allyne Gee RN    [place] Bing, MD, Lenise Arena.C.Gaylord Shih, MD, F.A.C.C Lewayne Bunting, MD, F.A.C.C Nona Dell, MD, F.A.C.C Charlton Haws, MD, Lenise Arena.C.C

## 2010-02-22 ENCOUNTER — Telehealth (INDEPENDENT_AMBULATORY_CARE_PROVIDER_SITE_OTHER): Payer: Self-pay | Admitting: *Deleted

## 2010-02-25 ENCOUNTER — Ambulatory Visit (INDEPENDENT_AMBULATORY_CARE_PROVIDER_SITE_OTHER): Payer: BC Managed Care – PPO

## 2010-02-25 ENCOUNTER — Encounter (INDEPENDENT_AMBULATORY_CARE_PROVIDER_SITE_OTHER): Payer: Self-pay | Admitting: *Deleted

## 2010-02-25 ENCOUNTER — Encounter: Payer: Self-pay | Admitting: Cardiology

## 2010-02-25 DIAGNOSIS — I2510 Atherosclerotic heart disease of native coronary artery without angina pectoris: Secondary | ICD-10-CM

## 2010-02-25 DIAGNOSIS — I10 Essential (primary) hypertension: Secondary | ICD-10-CM

## 2010-02-28 NOTE — Progress Notes (Signed)
Summary: [name] WANTS SEEN TODAY  Phone Note Call from Patient Call back at Home Phone [phone]   Caller: [name] Reason for Call: Talk to Nurse Summary of Call: [name] WOULD LIKE SEEN TODAY INSTEAD OF MONDAY FOR HIS NURSE VISIT, HE IS NOT SURE IF HE IS HAVING HEART PROBLUMES OR INDIGESTION. Initial call taken by: Faythe Ghee,  February 22, 2010 1:21 PM  Follow-up for Phone Call        if any further cp come to ED over the weekend Follow-up by: Teressa Lower RN,  February 22, 2010 5:12 PM

## 2010-03-06 NOTE — Letter (Signed)
Summary: BP LOG  BP LOG   Imported By: Faythe Ghee 02/25/2010 16:52:04  _____________________________________________________________________  External Attachment:    Type:   Image     Comment:   External Document

## 2010-03-06 NOTE — Assessment & Plan Note (Signed)
Summary: nurse visit per Marlane Mingle   Visit Type:  2 MONTH NURSE VISIT Primary Provider:  John Giovanni, MD   History of Present Illness: S: 2 month nurse visit B: office visit 01/10/2011,added hct to lisinopril 20/12.5 daily, norvasc 5mg  daily A: denies c/o  03/01/10  Approximately 20-25% of BP determinations show BP systolic >145.   Increase lisinopril-HCT to 2 tablets once daily Continue home BPs RN check in 1 month.  [place] Bing, M.D.     Allergies: No Known Drug Allergies  Prescriptions: LISINOPRIL-HYDROCHLOROTHIAZIDE 20-12.5 MG TABS (LISINOPRIL-HYDROCHLOROTHIAZIDE) Take 2 tablet by mouth once a day  #60 x 3   Entered by:   Teressa Lower RN   Authorized by:   Kathlen Brunswick, MD, [facility]   Signed by:   Teressa Lower RN on 03/01/2010   Method used:   Electronically to        [facility] [phone]* (retail)       [address]       [place], [place]  [address]       Ph: [phone] or [phone]       Fax: [phone]   RxID:   [id number]

## 2010-03-12 ENCOUNTER — Telehealth (INDEPENDENT_AMBULATORY_CARE_PROVIDER_SITE_OTHER): Payer: Self-pay | Admitting: *Deleted

## 2010-03-21 NOTE — Progress Notes (Signed)
Summary: BP LOW AND FEELS DIZZY  Phone Note Call from Patient Call back at [facility] Phone [phone]   Caller: PT Summary of Call: PT BP @ 2:02102/51 PULSE 70.  212PM 89/47 PULSE 56 Initial call taken by: Faythe Ghee,  March 12, 2010 2:19 PM  Follow-up for Phone Call        I spoke with pt, discussed bp monitoring techniques and when to take bp,  Pt feeling better bp back to normal 140's systolic, asked pt to take bp daily, call for sbp < 90 Follow-up by: Teressa Lower RN,  March 13, 2010 9:19 AM

## 2010-03-29 ENCOUNTER — Encounter: Payer: Self-pay | Admitting: Cardiology

## 2010-03-29 ENCOUNTER — Ambulatory Visit (INDEPENDENT_AMBULATORY_CARE_PROVIDER_SITE_OTHER): Payer: BC Managed Care – PPO

## 2010-03-29 DIAGNOSIS — I10 Essential (primary) hypertension: Secondary | ICD-10-CM

## 2010-04-02 NOTE — Letter (Signed)
Summary: BP LOG  BP LOG   Imported By: Faythe Ghee 03/29/2010 16:45:09  _____________________________________________________________________  External Attachment:    Type:   Image     Comment:   External Document

## 2010-04-16 NOTE — Assessment & Plan Note (Signed)
Summary: 1 mth nurse visit per Tammy on 03/01/10/tg   Primary Provider:  John Giovanni, MD   History of Present Illness: S: Pt. arrives in office for BP check/nurse visit. B: On last BP check/nurse visit pt. was asked to increase Lisinopril-HCT 20/12.5 to 2 tablets daily, continue home BP's and come to BP check today. A: Pt. brought in his BP diary (scanned into chart) and a list of his medications. He is not taking Niacin 500mg  (2 tablets qhs) at all, only takes 1 tablet of Metoprolol 25mg  (should be taking 1 tablet bid) most days because he states it makes his HR drop into the 40's and refuses to take Lisinopril-HCT 20/12.5 (2 tablets in am) due to having to go to bathroom all night and b/c he has no swelling so he does not feel he needs the HCTZ. He states he still has some of the lisinopril 20mg  tablets that he was taking before he was told to start taking Lisinopril-HCT that he takes instead. Pt's BP today is 116/63.  R: Pt. encouraged to take his medications as directed and at the correct times of day (Lisinopril-HCT should be taken in the am, not at qhs and Metoprolol should be taken twice a day not 2 tablets at the same time). Also, he is advised that we will contact him with Dr. Marvel Plan recommendations, if any.  04/09/10 Change medication list in Epic to reflect the meds he is actually taking. Refills would best be provided by PMD F/U in 12/12 as previously planned.  [place] Bing, M.D.   Current Medications (verified): 1)  Proventil Hfa 108 (90 Base) Mcg/act Aers (Albuterol Sulfate) 2)  Gemfibrozil 600 Mg Tabs (Gemfibrozil) .... Take 1 Tab Two Times A Day 3)  Levothyroxine Sodium 100 Mcg Tabs (Levothyroxine Sodium) .... Take 1 Tab Daily 4)  Tolbutamide 500 Mg Tabs (Tolbutamide) .... 1/2 Tab Daily 5)  Metoprolol Tartrate 25 Mg Tabs (Metoprolol Tartrate) .... Take 1 Tab Two Times A Day 6)  Pravastatin Sodium 40 Mg Tabs (Pravastatin Sodium) .... Take 1 Tab Daily 7)  Diazepam  10 Mg Tabs (Diazepam) .... Take As Needed 8)  Aspir-Low 81 Mg Tbec (Aspirin) .... Take 1 Tab Daily 9)  Hydrocodone-Acetaminophen 7.5-750 Mg Tabs (Hydrocodone-Acetaminophen) .... Take As Needed 10)  Nitrostat 0.4 Mg Subl (Nitroglycerin) .... Take As Needed For Chest Pain 11)  Lisinopril-Hydrochlorothiazide 20-12.5 Mg Tabs (Lisinopril-Hydrochlorothiazide) .... Take 2 Tablet By Mouth Once A Day 12)  Niacin 500 Mg Tabs (Niacin) .... Take 2 Tablets Daily 13)  Amlodipine Besylate 5 Mg Tabs (Amlodipine Besylate) .... Take One Tablet By Mouth Daily  Allergies (verified): No Known Drug Allergies  Vital Signs:  Patient profile:   78 year old male O2 Sat:      93 % on Room air Pulse rate:   70 / minute BP sitting:   116 / 63  (right arm)  Vitals Entered ByLarita Fife Via LPN (March 29, 2010 4:24 PM)  O2 Flow:  Room air

## 2010-05-09 ENCOUNTER — Telehealth: Payer: Self-pay

## 2010-05-13 ENCOUNTER — Ambulatory Visit (INDEPENDENT_AMBULATORY_CARE_PROVIDER_SITE_OTHER): Payer: BC Managed Care – PPO | Admitting: Adult Health

## 2010-05-13 ENCOUNTER — Encounter: Payer: Self-pay | Admitting: Adult Health

## 2010-05-13 DIAGNOSIS — I10 Essential (primary) hypertension: Secondary | ICD-10-CM

## 2010-05-13 DIAGNOSIS — I2510 Atherosclerotic heart disease of native coronary artery without angina pectoris: Secondary | ICD-10-CM

## 2010-05-13 DIAGNOSIS — I679 Cerebrovascular disease, unspecified: Secondary | ICD-10-CM

## 2010-05-13 DIAGNOSIS — E785 Hyperlipidemia, unspecified: Secondary | ICD-10-CM

## 2010-05-13 DIAGNOSIS — R0989 Other specified symptoms and signs involving the circulatory and respiratory systems: Secondary | ICD-10-CM

## 2010-05-13 NOTE — Patient Instructions (Addendum)
**Note De-Identified  Obfuscation** Your physician recommends that you return for lab work in: This week, please do not eat or drink after midnight the night before labs are drawn.  Your physician has requested that you have a carotid duplex. This test is an ultrasound of the carotid arteries in your neck. It looks at blood flow through these arteries that supply the brain with blood. Allow one hour for this exam. There are no restrictions or special instructions.  Your physician has recommended you make the following change in your medication: Stop taking Norvasc and only take Lisinopril/HCT 20/12.5mg  every morning.  Your physician recommends that you schedule a follow-up appointment in: 1 month

## 2010-05-13 NOTE — Assessment & Plan Note (Signed)
He has not had a carotid ultrasound in 2 years. Last report November 2010,demonstrated:  Extensive plaque formation bilaterally in the carotid systems without elevated velocities to suggest hemodynamically significant stenosis. His carotid bruit on the right is 2/6 and I will recheck this for updated evaluation.

## 2010-05-13 NOTE — Assessment & Plan Note (Signed)
[name] is not compliant with his medications concerning dosing as prescribed.  He refuses amlodipine. I have advised him to take lisinopril/HCTZ 20/12.5mg  daily in the am. He can continue to take metoprolol 12.5mg  BID as his BP recordings do suggest bradycardia on higher dose.  He will continue to record his BP and follow-up in one month. I will have him do lab work BMET at that time to evaluate renal fx on HCTZ with diabetes. He verbalizes understanding and is willing to try the new regimen.  If he becomes symptomatic he will call.

## 2010-05-13 NOTE — Progress Notes (Signed)
. HPI: [name] is a 77 y/o CM we are following for ongoing assessment and treatment ofCAD, multiple CVRF, cerebrovascular disease, and hyperlipidemia.  He is here today for BP check and confusion concerning his medications.  He was last seen by Dr. Dietrich Pates on 01/09/2010 at which time he was started on Norvasc and HCTZ because of continued hypertension.  [name] has a friend who is a nurse who is helping him with his medications and adjusting his medications for him.  He has stopped taking the norvasc ("because it can cause me to have a heart attack"), also stopped taking lisnopril 20mg /HCTZ, and has cut down his lopressor from 25mg  BID to 12.5mg  BID because he says that his HR remained in the 40's and he felt bad. He comes today because he wants to follow-up on BP control and have his medications adjusted.He is adamant about not restarting amlodipine.  He denies chest pain, shortness of breath, dizziness or headache.   Not on File  Current Outpatient Prescriptions  Medication Sig Dispense Refill  . albuterol (PROVENTIL HFA) 108 (90 BASE) MCG/ACT inhaler Inhale 2 puffs into the lungs as needed.        Marland Kitchen aspirin 81 MG EC tablet Take 81 mg by mouth daily.        . diazepam (VALIUM) 5 MG tablet Take 5 mg by mouth every 6 (six) hours as needed.        Marland Kitchen gemfibrozil (LOPID) 600 MG tablet Take 600 mg by mouth 2 (two) times daily before a meal.        . HYDROcodone-acetaminophen (VICODIN ES) 7.5-750 MG per tablet Take 1 tablet by mouth as needed.        Marland Kitchen levothyroxine (SYNTHROID, LEVOTHROID) 100 MCG tablet Take 100 mcg by mouth daily.        Marland Kitchen lisinopril (PRINIVIL,ZESTRIL) 20 MG tablet Take 20 mg by mouth daily.        . metoprolol tartrate (LOPRESSOR) 25 MG tablet Take 25 mg by mouth daily. Take 1/2 tab bid       . nitroGLYCERIN (NITROSTAT) 0.4 MG SL tablet Place 0.4 mg under the tongue every 5 (five) minutes as needed.        . pravastatin (PRAVACHOL) 40 MG tablet Take 40 mg by mouth at bedtime.         . TOLBUTamide (ORINASE) 500 MG tablet Take 500 mg by mouth daily. Take 1/2 tablet daily       . amLODipine (NORVASC) 5 MG tablet Take 5 mg by mouth daily.        Marland Kitchen DISCONTD: diazepam (VALIUM) 10 MG tablet Take 5 mg by mouth as needed.         No past medical history on file.  No past surgical history on file.  ROS: Review of systems complete and found to be negative unless listed above PHYSICAL EXAM BP 152/67  Pulse 61  Ht 5\' 6"  (1.676 m)  Wt 169 lb (76.658 kg)  BMI 27.28 kg/m2  SpO2 93% General: Well developed, well nourished, in no acute distress Head: Eyes PERRLA, No xanthomas.   Normal cephalic and atramatic  Lungs: Clear bilaterally to auscultation and percussion. Heart: HRRR S1 S2.  Pulses are 2+ & equal.            Right carotid bruit. No JVD.  No abdominal bruits. No femoral bruits. Abdomen: Bowel sounds are positive, abdomen soft and non-tender without masses or  Hernia's noted. Msk:  Back normal, normal gait. Normal strength and tone for age. Extremities: No clubbing, cyanosis or edema.  DP +1 Neuro: Alert and oriented X 3. Psych:  Good affect, responds appropriately   ASSESSMENT AND PLAN

## 2010-05-13 NOTE — Assessment & Plan Note (Signed)
Currently he is asymptomatic and remains active. He will continue current medications.

## 2010-05-16 ENCOUNTER — Ambulatory Visit (HOSPITAL_COMMUNITY)
Admission: RE | Admit: 2010-05-16 | Discharge: 2010-05-16 | Disposition: A | Discharge status: Home / Self Care | Payer: Medicare Other | Source: Ambulatory Visit | Attending: Adult Health | Admitting: Adult Health

## 2010-05-16 DIAGNOSIS — I10 Essential (primary) hypertension: Secondary | ICD-10-CM | POA: Insufficient documentation

## 2010-05-16 DIAGNOSIS — R0989 Other specified symptoms and signs involving the circulatory and respiratory systems: Secondary | ICD-10-CM | POA: Insufficient documentation

## 2010-05-16 DIAGNOSIS — E119 Type 2 diabetes mellitus without complications: Secondary | ICD-10-CM | POA: Insufficient documentation

## 2010-05-16 DIAGNOSIS — I6529 Occlusion and stenosis of unspecified carotid artery: Secondary | ICD-10-CM | POA: Insufficient documentation

## 2010-05-22 ENCOUNTER — Encounter: Payer: Self-pay | Admitting: Cardiology

## 2010-05-22 DIAGNOSIS — D649 Anemia, unspecified: Secondary | ICD-10-CM | POA: Insufficient documentation

## 2010-05-24 ENCOUNTER — Telehealth: Payer: Self-pay | Admitting: Cardiology

## 2010-05-24 NOTE — Telephone Encounter (Signed)
Results of Carotid Dopplers / tg

## 2010-05-27 ENCOUNTER — Encounter: Payer: Self-pay | Admitting: *Deleted

## 2010-05-27 NOTE — Telephone Encounter (Signed)
Results given to pt's wife 

## 2010-05-28 NOTE — Letter (Signed)
October 18, 2007    Mila Homer. Sudie Bailey, MD  [address] [place], [place] [address]   RE:  [name], [name]  MRN:  [id number]  /  DOB:  05-19-1933   Dear Brett Canales,   [name] returns to the office for continued assessment and treatment  of coronary artery disease and cardiovascular risk factors.  Since his  visit last year, he has done quite well.  He was only able to tolerate  Niaspan at a dose of 500 mg b.i.d.  He has had no chest discomfort.  He  has no claudication, but noted some pain in the left lateral thigh or  left hip region week or two ago.  Blood pressure and diabetic control  have been good.  The most recent laboratory available to me is from  November of last year.  A1c was 6.3 at that time.  CBC and chemistry  profile were normal.  Lipid profile was good except for a low HDL and a  slightly elevated triglyceride level.  In recent weeks, Mr. Meditz has  had somewhat increased respiratory symptoms with rhinorrhea and a  slightly productive cough.  He has not clearly had fever nor chills.   CURRENT MEDICATIONS:  1. Albuterol 2 puffs t.i.d.  2. Levothyroxine 0.125 mg daily.  3. Metoprolol 25 mg b.i.d.  4. Lovastatin 40 mg daily.  5. Tolbutamide 500 mg b.i.d.  6. Niacin 500 mg b.i.d.  7. Fish oil 1 capsule daily.  8. Gemfibrozil 600 mg b.i.d.  9. Aspirin 81 mg daily.  10.Metformin 500 mg daily.  11.Diazepam 10 mg t.i.d. p.r.n.  12.Saw palmetto.   PHYSICAL EXAMINATION:  GENERAL:  Pleasant gentleman in no acute  distress.  VITAL SIGNS:  The weight is 166, 24 pounds less than last year.  He  attributes his weight loss to dental problems.  Blood pressure 130/70,  heart rate 70 and regular, respirations 14.  NECK:  No jugular venous distention; no carotid bruits.  LUNGS:  Inspiratory and expiratory rhonchi; expiratory wheeze; decreased  breath sounds at the bases.  CARDIAC:  Distant first and second heart sounds.  ABDOMEN:  Soft and nontender; no organomegaly.  EXTREMITIES:  No edema; distal pulses intact.   IMPRESSION:  [name] is doing quite well in all aspects of his  cardiovascular care except for a continued cigarette smoking.  Unfortunately, he does not appear motivated to stop.  You could consider  a substitution of fenofibrate for gemfibrozil to see if that would have  a more positive effect on triglycerides and HDL.  He tells me that he is  scheduled for laboratory testing in less than 2 months, so I will leave  that to your discretion and plan to see this nice gentleman again in 1  year.  His current medical regime appears optimal.    Sincerely,      Gerrit Friends. Dietrich Pates, MD, [facility]  Electronically Signed    RMR/MedQ  DD: 10/18/2007  DT: 10/19/2007  Job #: [id number]

## 2010-05-28 NOTE — Letter (Signed)
September 16, 2006    [name]. [name], M.D.  [address] [place], [place] [address]   RE:  Johnathan, [name]  MRN:  [id number]  /  DOB:  1933-09-13   Dear [name],   [name] returns to the office for continued assessment and treatment  of coronary disease and cardiovascular risk factors.  Since the last  visit one year ago, he has remained stable.  He reports no new medical  problems.  Control of diabetes and hypertension has been excellent.  Control of dyslipidemia is still good, but HDL was quite low when  assessed 2 weeks ago at 25.  Chemistry profile is normal.   The patient reports no cardiopulmonary symptoms.  He exercises in the  gym.  Unfortunately, he continues to smoke up to 1/2 pack of cigarettes  per day.  He has a prescription for Chantix, but has not yet tried to  use it.  He has used it some in the past without success.   CURRENT MEDICATIONS:  1. Albuterol t.i.d.  2. Levothyroxine .125 mg daily  3. Metoprolol 50 mg b.i.d.  4. Lovastatin 40 mg daily  5. Tolbutamide 500 mg b.i.d.  6. Long-acting Niacin 750 mg daily  7. Fish oil 2 capsules b.i.d.  8. Gemfibrozil 600 mg b.i.d.  9. Aspirin 81 mg daily  10.Metformin 500 mg b.i.d.  11.Diazepam 10 mg t.i.d.   On exam, pleasant gentleman in no acute distress.  The weight is 191--  unchanged.  Blood pressure 130/70, heart rate 60 and regular,  respirations 18.  NECK:  No jugular venous distention; normal carotid upstrokes without  bruits.  LUNGS:  Decreased breath sounds at the bases; prolonged expiratory  phase.  CARDIAC:  Distant first and second heart sounds.  ABDOMEN:  Soft, nontender; no bruits; aortic pulsation not appreciated;  no masses nor organomegaly.  EXTREMITIES:  No edema; distal pulses intact.   IMPRESSION:  [name] is doing generally well.  His use of Niacin is  suboptimal.  He prefers to try the short-acting preparation on a t.i.d.  basis.  If ineffective or he experiences adverse  effects, Niaspan should  be substituted.  He is strongly encouraged to start Chantix and to stop  smoking.  We will check a lipid profile in 3 months and plan to see this  nice gentleman again in one year.  Overall, despite his continued  cigarette smoking, he is doing quite well from the standpoint of  atherosclerosis.    Sincerely,      Johnathan Friends. Dietrich Pates, MD, [facility]  Electronically Signed    RMR/MedQ  DD: 09/16/2006  DT: 09/16/2006  Job #: [id number]

## 2010-05-30 ENCOUNTER — Encounter (HOSPITAL_COMMUNITY): Payer: Self-pay | Admitting: Radiology

## 2010-05-30 ENCOUNTER — Observation Stay (HOSPITAL_COMMUNITY)
Admission: EM | Admit: 2010-05-30 | Discharge: 2010-05-31 | Disposition: A | Discharge status: Home / Self Care | Payer: Medicare Other | Attending: Family Medicine | Admitting: Family Medicine

## 2010-05-30 ENCOUNTER — Emergency Department (HOSPITAL_COMMUNITY): Payer: Medicare Other

## 2010-05-30 DIAGNOSIS — E039 Hypothyroidism, unspecified: Secondary | ICD-10-CM | POA: Insufficient documentation

## 2010-05-30 DIAGNOSIS — J449 Chronic obstructive pulmonary disease, unspecified: Secondary | ICD-10-CM | POA: Insufficient documentation

## 2010-05-30 DIAGNOSIS — F17200 Nicotine dependence, unspecified, uncomplicated: Secondary | ICD-10-CM | POA: Insufficient documentation

## 2010-05-30 DIAGNOSIS — E871 Hypo-osmolality and hyponatremia: Secondary | ICD-10-CM | POA: Insufficient documentation

## 2010-05-30 DIAGNOSIS — E7800 Pure hypercholesterolemia, unspecified: Secondary | ICD-10-CM | POA: Insufficient documentation

## 2010-05-30 DIAGNOSIS — K5732 Diverticulitis of large intestine without perforation or abscess without bleeding: Principal | ICD-10-CM | POA: Insufficient documentation

## 2010-05-30 DIAGNOSIS — I739 Peripheral vascular disease, unspecified: Secondary | ICD-10-CM | POA: Insufficient documentation

## 2010-05-30 DIAGNOSIS — J4489 Other specified chronic obstructive pulmonary disease: Secondary | ICD-10-CM | POA: Insufficient documentation

## 2010-05-30 DIAGNOSIS — Z79899 Other long term (current) drug therapy: Secondary | ICD-10-CM | POA: Insufficient documentation

## 2010-05-30 DIAGNOSIS — I2510 Atherosclerotic heart disease of native coronary artery without angina pectoris: Secondary | ICD-10-CM | POA: Insufficient documentation

## 2010-05-30 LAB — BASIC METABOLIC PANEL
BUN: 15 mg/dL (ref 6–23)
Calcium: 9.3 mg/dL (ref 8.4–10.5)
Creatinine, Ser: 1.13 mg/dL (ref 0.4–1.5)
GFR calc non Af Amer: >60 mL/min (ref >60)
Glucose, Bld: 99 mg/dL (ref 70–99)
Sodium: 122 mEq/L — ABNORMAL LOW (ref 135–145)

## 2010-05-30 LAB — COMPREHENSIVE METABOLIC PANEL
ALT: 10 U/L (ref 0–53)
Alkaline Phosphatase: 61 U/L (ref 39–117)
CO2: 26 mEq/L (ref 19–32)
GFR calc non Af Amer: 55 mL/min — ABNORMAL LOW (ref >60)
Glucose, Bld: 112 mg/dL — ABNORMAL HIGH (ref 70–99)
Potassium: 3.9 mEq/L (ref 3.5–5.1)
Sodium: 122 mEq/L — ABNORMAL LOW (ref 135–145)

## 2010-05-30 LAB — DIFFERENTIAL
Basophils Absolute: 0 10*3/uL (ref 0.0–0.1)
Eosinophils Absolute: 0.3 10*3/uL (ref 0.0–0.7)
Eosinophils Relative: 4 % (ref 0–5)
Lymphocytes Relative: 28 % (ref 12–46)
Monocytes Absolute: 0.5 10*3/uL (ref 0.1–1.0)

## 2010-05-30 LAB — CBC
HCT: 31.8 % — ABNORMAL LOW (ref 39.0–52.0)
MCHC: 34.9 g/dL (ref 30.0–36.0)
Platelets: 258 10*3/uL (ref 150–400)
RDW: 12.6 % (ref 11.5–15.5)

## 2010-05-30 LAB — POCT CARDIAC MARKERS: Myoglobin, poc: 149 ng/mL (ref 12–200)

## 2010-05-30 MED ORDER — IOHEXOL 300 MG/ML  SOLN
100.0000 mL | Freq: Once | INTRAMUSCULAR | Status: AC | PRN
  Administered 2010-05-30: 100 mL via INTRAVENOUS

## 2010-05-31 NOTE — Op Note (Signed)
NAME:  DMARION, PERFECT                ACCOUNT NO.:  [id number]   MEDICAL RECORD NO.:  [id number]          PATIENT TYPE:  AMB   LOCATION:  DAY                           FACILITY:  [facility]   PHYSICIAN:  Lionel December, M.D.    DATE OF BIRTH:  02-28-1933   DATE OF PROCEDURE:  02/21/2004  DATE OF DISCHARGE:                                 OPERATIVE REPORT   PROCEDURE:  Attempted colonoscopy which was incomplete.   INDICATIONS:  [name] is a 77 year old Caucasian male who is here for  screening colonoscopy. He has intermittent constipation felt to be secondary  to his medications. He also has hematochezia thought to be secondary to  hemorrhoids which only occurs when he is constipated and has strain.  Procedure risks were reviewed the patient, and informed consent was  obtained.   PREMEDICATION:  Demerol 25 mg IV, Versed 6 mg IV in divided dose.   FINDINGS:  Procedure performed in endoscopy suite. The patient's vital signs  and O2 sat were monitored during procedure remained stable. The patient was  placed left lateral position. Rectal examination performed. No abnormality  noted external or digital exam. Olympus videoscope was placed rectum and  advanced under vision into sigmoid colon where scattered diverticula noted.  Very tortuous noncompliant sigmoid colon. Was not able to advance the scope  into proximal sigmoid colon and descending colon. Scope was pulled back to  the rectum. The patient was the repositioned in supine position, as well as  on the right side but without any success. Therefore, endoscope was  withdrawn. Part of the sigmoid colon that was examined did not reveal any  other abnormalities except diverticula. Rectal mucosa was normal. Scope was  retroflexed to examine anorectal junction which was unremarkable. Endoscope  was then withdrawn. The patient tolerated the procedure well.   FINAL DIAGNOSIS:  Sigmoid colon diverticulosis with a tortuous noncompliant  sigmoid colon  preventing complete exam.   RECOMMENDATIONS:  1.  He will return for barium enema in a couple of weeks.  2.  Citrucel 1 tablespoonful daily.  3.  If necessary, he can take Colace 2 tablets at bedtime as well.  4.  He will resume his usual diet and medications as before.      NR/MEDQ  D:  02/21/2004  T:  02/21/2004  Job:  [id number]

## 2010-05-31 NOTE — Assessment & Plan Note (Signed)
[facility]                         [place] [facility] OFFICE NOTE   NAME:[name], [name]                       MRN:          [id number]  DATE:08/14/2005                            DOB:          1933/07/26    HISTORY:  [name] returns to the office for continued assessment and  treatment of coronary disease and cardiovascular risk factors.  He reports  no chest pain or dyspnea.  He has not experienced any significant medical  issues over the past year.  Unfortunately, he continues to smoke cigarettes,  although he continues to taper usage, as well, by self report, currently one-  fifth of a pack per day.  Blood pressure control has been good.   CURRENT MEDICATIONS:  1.  Albuterol by MDI.  2.  Levothyroxine 0.125 mg daily.  3.  Metoprolol 50 mg b.i.d.  4.  Lovastatin 40 mg daily.  5.  Tolbutamide 500 mg b.i.d.  6.  Niacin 750 mg daily.  7.  Fish oil two capsules b.i.d.  8.  Gemfibrozil 600 mg b.i.d.  9.  Chantix 1 mg b.i.d.  10. Aspirin 325 mg every other day.   EXAMINATION:  GENERAL:  Overweight, pleasant gentleman in no acute distress.  VITAL SIGNS:  Weight 190, 3 pounds less than last year.  Blood pressure  120/70, heart rate 60 and regular, respirations 16.  NECK:  No jugular venous distention.  No carotid bruits appreciated.  LUNGS:  Few inspiratory rhonchi.  Decreased breath sounds at the bases.  CARDIAC:  Distant first and second heart sounds.  ABDOMEN:  Soft and nontender.  No bruits.  No organomegaly.  EXTREMITIES:  No edema.  Distal pulses intact.   IMPRESSION:  [name] is doing generally well from a cardiovascular  standpoint.  He is encouraged to completely discontinue use of cigarettes.  His dose of aspirin will be reduced to 81 mg daily.  Lipid profile was  assessed just a few months ago and demonstrated that his complex  pharmacologic regimen is effective except for a residual low HDL level.  I  will plan to reassess  this nice gentleman in one year.                                   Gerrit Friends. Dietrich Pates, MD, [facility]   RMR/MedQ  DD:  08/14/2005  DT:  08/14/2005  Job #:  [id number]

## 2010-06-06 ENCOUNTER — Encounter: Payer: Self-pay | Admitting: Cardiology

## 2010-06-06 DIAGNOSIS — I2510 Atherosclerotic heart disease of native coronary artery without angina pectoris: Secondary | ICD-10-CM

## 2010-06-06 DIAGNOSIS — E079 Disorder of thyroid, unspecified: Secondary | ICD-10-CM

## 2010-06-06 DIAGNOSIS — I6529 Occlusion and stenosis of unspecified carotid artery: Secondary | ICD-10-CM

## 2010-06-07 ENCOUNTER — Encounter: Payer: Self-pay | Admitting: Cardiology

## 2010-06-12 ENCOUNTER — Encounter: Payer: Self-pay | Admitting: Cardiology

## 2010-06-12 ENCOUNTER — Ambulatory Visit (INDEPENDENT_AMBULATORY_CARE_PROVIDER_SITE_OTHER): Payer: Medicare Other | Admitting: Cardiology

## 2010-06-12 DIAGNOSIS — E118 Type 2 diabetes mellitus with unspecified complications: Secondary | ICD-10-CM | POA: Insufficient documentation

## 2010-06-12 DIAGNOSIS — D649 Anemia, unspecified: Secondary | ICD-10-CM

## 2010-06-12 DIAGNOSIS — F17200 Nicotine dependence, unspecified, uncomplicated: Secondary | ICD-10-CM

## 2010-06-12 DIAGNOSIS — E119 Type 2 diabetes mellitus without complications: Secondary | ICD-10-CM

## 2010-06-12 DIAGNOSIS — R972 Elevated prostate specific antigen [PSA]: Secondary | ICD-10-CM | POA: Insufficient documentation

## 2010-06-12 DIAGNOSIS — I2510 Atherosclerotic heart disease of native coronary artery without angina pectoris: Secondary | ICD-10-CM

## 2010-06-12 DIAGNOSIS — F1011 Alcohol abuse, in remission: Secondary | ICD-10-CM

## 2010-06-12 DIAGNOSIS — I739 Peripheral vascular disease, unspecified: Secondary | ICD-10-CM | POA: Insufficient documentation

## 2010-06-12 DIAGNOSIS — E785 Hyperlipidemia, unspecified: Secondary | ICD-10-CM

## 2010-06-12 DIAGNOSIS — Z720 Tobacco use: Secondary | ICD-10-CM | POA: Insufficient documentation

## 2010-06-12 DIAGNOSIS — I679 Cerebrovascular disease, unspecified: Secondary | ICD-10-CM

## 2010-06-12 NOTE — Assessment & Plan Note (Signed)
Anemia assessment and treatment will be managed by Dr. Sherwood Gambler

## 2010-06-12 NOTE — Assessment & Plan Note (Addendum)
Patient has had a right carotid bruit for years without neurologic symptoms and with modest atherosclerosis in 2005.  A recent repeat study(05/2010) again revealed no significant focal stenosis, but there was a substantial increase in overall plaque burden and possible ulceration in the left internal carotid artery.

## 2010-06-12 NOTE — Patient Instructions (Addendum)
**Note De-Identified  Obfuscation** Your physician has recommended you make the following change in your medication: Stop taking Gemfibrozil  Your physician recommends that you schedule a follow-up appointment in: 1 year

## 2010-06-12 NOTE — Assessment & Plan Note (Signed)
Excellent control of hyperlipidemia.  Gemfibrozil is not necessarily provided additional benefit and will be discontinued.

## 2010-06-12 NOTE — Assessment & Plan Note (Signed)
[name] continues to do extremely well with respect to coronary disease with no symptoms to suggest myocardial ischemia or CHF.

## 2010-06-12 NOTE — Progress Notes (Signed)
HPI : Mr. Bingman returns to the office as scheduled for continued assessment and treatment of coronary disease and cardiovascular risk factors.  Since his last visit, in fact since he suffered a myocardial infarction 12 years ago, he has done extremely well from a cardiac standpoint.  He reports no chest discomfort and only mild dyspnea on exertion.  Unfortunately, he continues to smoke approximately 1/3 pack of cigarettes per day.  He was hospitalized earlier this month for diverticular disease that was managed medically.  He has chronic pain in the right jaw that has defied diagnosis and treatment.  He denies orthopnea, PND, lightheadedness or syncope.  Current Outpatient Prescriptions on File Prior to Visit  Medication Sig Dispense Refill  . albuterol (PROVENTIL HFA) 108 (90 BASE) MCG/ACT inhaler Inhale 2 puffs into the lungs as needed.        Marland Kitchen aspirin 81 MG EC tablet Take 81 mg by mouth daily.        . diazepam (VALIUM) 5 MG tablet Take 5 mg by mouth every 6 (six) hours as needed.        Marland Kitchen HYDROcodone-acetaminophen (VICODIN ES) 7.5-750 MG per tablet Take 1 tablet by mouth as needed.        Marland Kitchen levothyroxine (SYNTHROID, LEVOTHROID) 100 MCG tablet Take 100 mcg by mouth daily.        . metoprolol tartrate (LOPRESSOR) 25 MG tablet Take 25 mg by mouth daily. Take 1/2 tab bid       . nitroGLYCERIN (NITROSTAT) 0.4 MG SL tablet Place 0.4 mg under the tongue every 5 (five) minutes as needed.        . pravastatin (PRAVACHOL) 40 MG tablet Take 40 mg by mouth at bedtime.        . TOLBUTamide (ORINASE) 500 MG tablet Take 500 mg by mouth daily. Take 1/2 tablet daily       . DISCONTD: gemfibrozil (LOPID) 600 MG tablet Take 600 mg by mouth 2 (two) times daily before a meal.        . DISCONTD: amLODipine (NORVASC) 5 MG tablet Take 5 mg by mouth daily.        Marland Kitchen DISCONTD: lisinopril (PRINIVIL,ZESTRIL) 20 MG tablet Take 20 mg by mouth daily.           No Known Allergies    Past medical history, social history,  and family history reviewed and updated.  ROS: See history of present illness.  PHYSICAL EXAM: BP 155/62  Pulse 65  Ht 5\' 11"  (1.803 m)  Wt 166 lb (75.297 kg)  BMI 23.15 kg/m2  SpO2 93%  General-Well developed; no acute distress Body habitus-proportionate weight and height Neck-No JVD; Right carotid bruit Lungs-moderate kyphosis; inspiratory and expiratory rhonchi and wheezing; no rales; resonant to percussion; prolonged expiratory phase Cardiovascular-normal PMI; normal S1 and S2; modest systolic ejection murmur Abdomen-normal bowel sounds; soft and non-tender without masses or organomegaly Musculoskeletal-No deformities, no cyanosis or clubbing Neurologic-Normal cranial nerves; symmetric strength and tone Skin-Warm, no significant lesions Extremities-distal pulses intact-somewhat decreased on the left; 1/2+ edema  EKG(05/30/2010)-Normal sinus rhythm; First degree AV block; IACD; right bundle branch block; left anterior fascicular block.  ASSESSMENT AND PLAN:

## 2010-06-14 HISTORY — PX: COLONOSCOPY: SHX174

## 2010-06-18 ENCOUNTER — Ambulatory Visit (INDEPENDENT_AMBULATORY_CARE_PROVIDER_SITE_OTHER): Payer: Medicare Other | Admitting: Internal Medicine

## 2010-06-18 DIAGNOSIS — R109 Unspecified abdominal pain: Secondary | ICD-10-CM

## 2010-06-18 DIAGNOSIS — Z01818 Encounter for other preprocedural examination: Secondary | ICD-10-CM

## 2010-06-18 DIAGNOSIS — Z8719 Personal history of other diseases of the digestive system: Secondary | ICD-10-CM

## 2010-06-20 ENCOUNTER — Encounter: Payer: Self-pay | Admitting: Cardiology

## 2010-07-02 NOTE — Discharge Summary (Signed)
NAME:  [name], [name]                ACCOUNT NO.:  [id number]  MEDICAL RECORD NO.:  [id number]           PATIENT TYPE:  I  LOCATION:  A331                          FACILITY:  [facility]  PHYSICIAN:  Mila Homer. Sudie Bailey, M.D.DATE OF BIRTH:  February 16, 1933  DATE OF ADMISSION:  05/30/2010 DATE OF DISCHARGE:  05/18/2012LH                              DISCHARGE SUMMARY   OBSERVATION DICTATION  This 77 year old presented to the [facility] Emergency Department on Thursday, May 30, 2010, with the complaint of having watery diarrhea four or five times per day for about three to four days. The watery diarrhea being discolored, a blackish color, starting a day after he started Pepto-Bismol, but he had had this happen before when he used Pepto-Bismol.  He has had a number of chronic medical problems including: 1. Coronary artery disease. 2. Hypothyroidism. 3. Hypercholesterolemia. 4. Tobacco use disorder. 5. COPD.  CURRENT MEDICATIONS: 1. Aspirin 81 mg daily. 2. Diazepam 5 mg p.r.n. 3. Gemfibrozil 600 mg b.i.d. 4. Albuterol via inhaler two to three puffs q.4h. 5. Hydrocodone/acetaminophen 5/325 for pain. 6. Levothyroxine 100 mcg daily. 7. Lisinopril 20 mg daily. 8. Metoprolol 25 mg, one-half tablet b.i.d. 9. Nitroglycerin p.r.n. 10.He is also on pravastatin 40 mg daily. 11.Tolbutamide 5 mg daily. 12.Niacin 5 mg daily.  ALLERGIES:  NO KNOWN ALLERGIES.  PHYSICAL EXAM:  GENERAL:  Physical exam the morning after he was brought to the hospital showed a well-developed, well-nourished man who was in no acute distress.  He was eating breakfast of clear liquids.  He had no abdominal pain.  VITAL SIGNS:  Temperature 97.9, pulse 54, respiratory rate 20, blood pressure 122/60. HEART:  His heart had a regular rhythm, rate of about 60. LUNGS:  Appeared clear throughout with decreased breath sounds throughout.  There were no intercostal retractions or use of accessory muscles for  respiration. ABDOMEN:  Soft without organomegaly or mass and without tenderness anywhere on palpation. EXTREMITIES:  He had no edema of his ankles.  O2 sats were 94% and 96% on room air.  His admission chest x-ray was consistent COPD.  His abdominal CT scan showed sigmoid diverticulitis and also aneurysmal dilatation of the common iliac arteries and, also, he had atherosclerotic changes in the aortoiliac areas.  His white cell count was 6000 and hemoglobin 11.1.  CMP showed sodium 122, chloride 87, lipase 80.  He was treated with normal saline IV at 125 mL/hour with vital signs q.i.d. and a clear liquid diet.  He was put on Cipro 400 mg IV b.i.d. and Flagyl 500 mg IV b.i.d.  He was given diazepam 5 mg for anxiety, gemfibrozil 600 mg daily, hydrocodone 5/500 q.4h. for pain, levothyroxine 100 mcg daily, lisinopril 20 mg daily, pravastatin 40 mg daily, niacin 5 mg at bedtime and nitroglycerin 0.4 mg p.r.n.  He did well on this regimen, was feeling fine.  The morning after admission he was ready for discharge home.  He was discharged home on his preadmission medications as noted above which included: 1. Diazepam. 2. Gemfibrozil. 3. Hydrocodone/APAP. 4. Levothyroxine. 5. Lisinopril. 6. Metoprolol. 7. Niacin. 8. Nitroglycerin. 9. Pravastatin.  10.He was also put on Cipro 500 mg b.i.d. (#20 with no refills) and     metronidazole 500 mg t.i.d. (#30 with no refills).  Follow-up was to be in my office in three to four days.  FINAL DISCHARGE DIAGNOSES: 1. Sigmoid diverticulitis. 2. Electrolyte abnormalities including hyponatremia and hypochloremia. 3. Coronary artery disease. 4. Chronic obstructive pulmonary disease. 5. Tobacco use disorder. 6. Hypercholesterolemia. 7. Peripheral arterial disease. 8. Hypothyroidism.     Mila Homer. Sudie Bailey, M.D.     SDK/MEDQ  D:  05/31/2010  T:  05/31/2010  Job:  [id number]  Electronically Signed by John Giovanni M.D. on 07/02/2010  06:55:05 AM

## 2010-07-14 NOTE — Consult Note (Signed)
NAMEMarland Kitchen  Johnathan, Peterson NO.:  [id number]  MEDICAL RECORD NO.:  [id number]  LOCATION:                                 FACILITY:  PHYSICIAN:  Lionel December, M.D.    DATE OF BIRTH:  12-Nov-1933  DATE OF CONSULTATION:  06/19/2010 DATE OF DISCHARGE:                                CONSULTATION   PRESENTING COMPLAINT:  Recent bout of diverticulitis.  HISTORY OF PRESENT ILLNESS:  [name] is a 77 year old Caucasian male who is referred through a courtesy of Dr. Sudie Bailey for GI evaluation.  He developed diarrhea last month which lasted for about a week.  He felt weak and ended up in emergency room on May 30, 2010.  While in emergency room, he had abdominopelvic CT which showed changes of sigmoid diverticulitis.  He was treated with antibiotics.  He was able to go home 24 hours later.  He states that his diarrhea has resolved and his stools are back to almost normal.  Before he got sick, he was having either normal stools or every now and then he would get constipated and was using Fleet's enemas once or twice a week.  He states even though he had diarrhea for 1 week, he does not recall that he had abdominal pain whatsoever and he also did not have any fever, chills, nausea or vomiting.  He denies melena or rectal bleeding.  He states his heartburn generally is well-controlled with PPI.  He recalls that he took antibiotics, but that was 6 months ago for prostatitis.  He states over the last few years, he has lost well over 50 pounds, but his weight has been stable this year.  He has history of colonic polyps.  He had a large adenoma removed from his sigmoid colon in 1989.  He had an exam in November 1998 and most recently incomplete exam in February 2006, which was followed by barium enema.  CURRENT MEDICATIONS: 1. Albuterol inhaler 2 puffs q.4 p.r.n. 2. Gemfibrozil 600 mg p.o. daily. 3. Hydrocodone/APAP 5/325 one p.o. q.4 p.r.n. He only takes it     occasionally and not  daily. 4. Levothyroxine 100 mcg daily. 5. Lisinopril 10 mg daily. 6. Metoprolol 12.5 mg p.o. b.i.d. 7. Niacin 500 mg p.o. nightly. 8. Nitroglycerin 0.4 mg sublingual p.r.n. 9. Lansoprazole 30 mg p.o. q.a.m. 10.Alprazolam 1 mg p.o. b.i.d. 11.Pravastatin 40 mg p.o. q.a.m.  PAST MEDICAL HISTORY:  He has coronary artery disease, history of diabetes mellitus.  He has not required any therapy since he has lost weight.  He has hypothyroidism, hypertension, COPD, bronchial asthma and hyperlipidemia.  History of colonic polyps and diverticulosis as above and recent hospitalization for diverticulitis.  He had sinus surgery 15- 20 years ago.  ALLERGIES:  NK.  FAMILY HISTORY:  Mother had dementia lived to be in her 54s.  Father died of MI at age 1.  He had a sister who died of bronchial asthma in her 47s.  SOCIAL HISTORY:  He is married.  He has one son, is generally in good health.  He is retired from 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  He has been smoking cigarettes since  he was 55 or 77 years old and currently smoking half a pack a day.  He used to drink alcohol socially, but quit 12 years ago.  OBJECTIVE:  VITAL SIGNS:  Weight 166 pounds, he is 67 inches tall, pulse 76 per minute, blood pressure 138/80, temperature is 97.8. EYES:  Conjunctivae are pink.  Sclerae are nonicteric. MOUTH:  Oropharyngeal mucosa is normal. NECK:  No neck masses or thyromegaly noted. CARDIAC:  With regular rhythm and normal S1 and S2.  No murmur or gallop noted. LUNGS:  Clear to auscultation.  Lungs revealed few scattered rhonchi at both bases. ABDOMEN:  Symmetrical.  Bowel sounds are normal on palpation soft and nontender without organomegaly or masses. RECTAL:  Deferred. EXTREMITIES:  No peripheral edema or clubbing noted.  Abdominopelvic CT from May 30, 2010, reviewed.  It shows pericolonic inflammatory changes in proximal sigmoid at descending colon.  These changes  indeed appear suspicious for diverticulitis.  ASSESSMENT: 1. Foye is a 77 year old Caucasian male who was recently treated for     diverticulitis.  This diagnosis was made on the basis of     abdominopelvic CT.  His symptoms were diarrhea and not necessarily     any pain.  He is currently asymptomatic.  He has history of colonic     polyps.  However, his last exam in February 2006, was incomplete     followed by colonoscopy. 2. We need to examine his colon to make sure he does not have occult     neoplasm.  RECOMMENDATIONS: 1. High-fiber diet. 2. We will schedule him for colonoscopy for diagnostic and     surveillance purposes later this month. 3. Procedure and risks were reviewed with the patient and his     questions were answered.  We appreciate the opportunity to participate in the care of this gentleman.          ______________________________ Lionel December, M.D.     NR/MEDQ  D:  06/19/2010  T:  06/20/2010  Job:  [id number]  cc:   Mila Homer. Sudie Bailey, M.D. Fax: [phone]  Electronically Signed by Lionel December M.D. on 07/14/2010 12:47:29 PM

## 2010-07-16 ENCOUNTER — Ambulatory Visit (HOSPITAL_COMMUNITY)
Admission: RE | Admit: 2010-07-16 | Discharge: 2010-07-16 | Disposition: A | Discharge status: Home / Self Care | Payer: Medicare Other | Source: Ambulatory Visit | Attending: Internal Medicine | Admitting: Internal Medicine

## 2010-07-16 ENCOUNTER — Encounter (HOSPITAL_BASED_OUTPATIENT_CLINIC_OR_DEPARTMENT_OTHER): Payer: Medicare Other | Admitting: Internal Medicine

## 2010-07-16 ENCOUNTER — Other Ambulatory Visit (INDEPENDENT_AMBULATORY_CARE_PROVIDER_SITE_OTHER): Payer: Self-pay | Admitting: Internal Medicine

## 2010-07-16 DIAGNOSIS — Z860100 Personal history of colon polyps, unspecified: Secondary | ICD-10-CM | POA: Insufficient documentation

## 2010-07-16 DIAGNOSIS — E785 Hyperlipidemia, unspecified: Secondary | ICD-10-CM | POA: Insufficient documentation

## 2010-07-16 DIAGNOSIS — Z79899 Other long term (current) drug therapy: Secondary | ICD-10-CM | POA: Insufficient documentation

## 2010-07-16 DIAGNOSIS — K5730 Diverticulosis of large intestine without perforation or abscess without bleeding: Secondary | ICD-10-CM | POA: Insufficient documentation

## 2010-07-16 DIAGNOSIS — I10 Essential (primary) hypertension: Secondary | ICD-10-CM | POA: Insufficient documentation

## 2010-07-16 DIAGNOSIS — K219 Gastro-esophageal reflux disease without esophagitis: Secondary | ICD-10-CM | POA: Insufficient documentation

## 2010-07-16 DIAGNOSIS — D126 Benign neoplasm of colon, unspecified: Secondary | ICD-10-CM

## 2010-07-16 DIAGNOSIS — E119 Type 2 diabetes mellitus without complications: Secondary | ICD-10-CM | POA: Insufficient documentation

## 2010-07-16 DIAGNOSIS — J4489 Other specified chronic obstructive pulmonary disease: Secondary | ICD-10-CM | POA: Insufficient documentation

## 2010-07-16 DIAGNOSIS — J449 Chronic obstructive pulmonary disease, unspecified: Secondary | ICD-10-CM | POA: Insufficient documentation

## 2010-07-16 DIAGNOSIS — D128 Benign neoplasm of rectum: Secondary | ICD-10-CM | POA: Insufficient documentation

## 2010-07-16 DIAGNOSIS — D129 Benign neoplasm of anus and anal canal: Secondary | ICD-10-CM | POA: Insufficient documentation

## 2010-07-16 DIAGNOSIS — Z7982 Long term (current) use of aspirin: Secondary | ICD-10-CM | POA: Insufficient documentation

## 2010-07-23 NOTE — Op Note (Signed)
NAME:  [name], [name]                ACCOUNT NO.:  [id number]  MEDICAL RECORD NO.:  [id number]  LOCATION:  [facility]                          FACILITY:  [facility]  PHYSICIAN:  Lionel December, M.D.    DATE OF BIRTH:  November 04, 1933  DATE OF PROCEDURE:  07/16/2010 DATE OF DISCHARGE:                              OPERATIVE REPORT   PROCEDURE:  Esophagogastroduodenoscopy followed by colonoscopy with snare polypectomy.  INDICATIONS:  [name] is a 77 year old Caucasian male who was recently treated for diverticulitis.  A CT which showed typical changes.  He also has history of colonic polyps.  His last exam was incomplete in 2006, followed by barium enema.  He is undergoing colonoscopy both for diagnostic and surveillance purposes.  The patient was also interested in having EGD.  He has recent onset of GERD and his recent onset of GERD controlled with a PPI, but he also has excessive salivation.  He has seen the oral surgeon and no abnormality has been found.  He is therefore also undergoing diagnostic EGD.  Procedure and risks were reviewed with the patient and informed consent was obtained.  MEDICATIONS FOR CONSCIOUS SEDATION: 1. Cetacaine spray for pharyngeal topical anesthesia. 2. Demerol 50 mg IV. 3. Versed 9 mg IV in divided dose. 4. Atropine 0.5 mg IV given for asymptomatic bradycardia.  FINDINGS:  Procedures performed in endoscopy suite.  The patient's vital signs and O2 sats were monitored during the procedure and remained stable.  PROCEDURES: 1. Esophagogastroduodenoscopy.  The patient was placed in left lateral     recumbent position and Pentax videoscope was passed through     oropharynx without any difficulty into esophagus. 2. Esophagus.  Mucosa of the esophagus normal.  GE junction was     located at 42-cm from the incisors and was unremarkable. 3. Stomach.  It was empty and distended very well with insufflation.     Folds of the proximal stomach were normal.  Examination of mucosa     at body, antrum, pyloric channel as well as angularis, fundus and     cardia were normal. 4. Duodenum.  Bulbar mucosa was normal.  Scope was passed into second     part of the duodenum where mucosa and folds were normal.  Endoscope     was withdrawn and the patient prepared for procedure #2. 5. Colonoscopy.  Rectal examination performed.  No abnormality noted     on external or digital exam.  Pentax videoscope was placed in the     rectum and advanced under vision into sigmoid colon and beyond.     Multiple diverticula were noted at sigmoid colon which was quite     tortuous, but slowly and carefully scope was passed beyond it and     then easily into the cecum which was identified by appendiceal     orifice and ileocecal valve.  As the scope was withdrawn, colonic     mucosa was carefully examined.  There was 6-mm polyp at rectum     which was snared and retrieved for a sludge examination.  The scope     was retroflexed to examine anorectal junction which was     unremarkable.  Endoscope was withdrawn.  Withdrawal time was 12     minutes. 6. The patient tolerated the procedure well.  FINAL DIAGNOSES: 1. Normal esophagogastroduodenoscopy. 2. Colonoscopy completed to cecum. 3. Multiple diverticula at sigmoid colon. 4. A 6-mm adenomatous polyp snared from the rectum.  RECOMMENDATIONS: 1. He will continue antireflux measures and PPI as before. 2. High-fiber diet plus fiber supplement 4 g daily. 3. No aspirin for 10 days. 4. I will be contacting the patient with results of biopsy and further     recommendations.          ______________________________ Lionel December, M.D.     NR/MEDQ  D:  07/16/2010  T:  07/16/2010  Job:  [id number]  cc:   Mila Homer. Sudie Bailey, M.D. Fax: [phone]  Electronically Signed by Lionel December M.D. on 07/23/2010 10:05:54 PM

## 2010-09-19 ENCOUNTER — Ambulatory Visit (INDEPENDENT_AMBULATORY_CARE_PROVIDER_SITE_OTHER): Payer: Medicare Other | Admitting: Otolaryngology

## 2010-09-19 DIAGNOSIS — K117 Disturbances of salivary secretion: Secondary | ICD-10-CM

## 2010-09-19 DIAGNOSIS — K1120 Sialoadenitis, unspecified: Secondary | ICD-10-CM

## 2011-01-02 ENCOUNTER — Telehealth (INDEPENDENT_AMBULATORY_CARE_PROVIDER_SITE_OTHER): Payer: Self-pay | Admitting: *Deleted

## 2011-01-02 NOTE — Telephone Encounter (Signed)
[name] left a message asking Terri if she would consider giving him more samples. States that the ones that he had been given really helped.

## 2011-01-03 NOTE — Telephone Encounter (Signed)
Samples of Dexilant given to patient. Left at front desk.

## 2011-02-03 ENCOUNTER — Telehealth: Payer: Self-pay | Admitting: Cardiology

## 2011-02-03 NOTE — Telephone Encounter (Signed)
Left message for Dr. Sudie Bailey office to send any recent labs.

## 2011-02-03 NOTE — Telephone Encounter (Signed)
Patient called and stated that Dr. Sudie Bailey wanted to put him on Lipitor.  Patient advised Lipitor made his legs hurt and that he was taking Pravachol.  Per patient, Dr. Sudie Bailey will turn over cholesterol medication to Dr. Dietrich Pates to manage.  Labs were done by Dr. Sudie Bailey office.

## 2011-02-04 ENCOUNTER — Telehealth: Payer: Self-pay | Admitting: *Deleted

## 2011-02-04 ENCOUNTER — Encounter: Payer: Self-pay | Admitting: Cardiology

## 2011-02-04 NOTE — Telephone Encounter (Signed)
Advised patient to continue current lipid lowering therapy .  Verbalized understanding.

## 2011-02-04 NOTE — Telephone Encounter (Signed)
Lipid profile in 01/2011:143, 30, 198, 73. Continue current therapy for hyperlipidemia.

## 2011-04-16 ENCOUNTER — Encounter (INDEPENDENT_AMBULATORY_CARE_PROVIDER_SITE_OTHER): Payer: Self-pay | Admitting: Internal Medicine

## 2011-04-16 ENCOUNTER — Ambulatory Visit (HOSPITAL_COMMUNITY)
Admission: RE | Admit: 2011-04-16 | Discharge: 2011-04-16 | Disposition: A | Discharge status: Home / Self Care | Payer: Medicare Other | Source: Ambulatory Visit | Attending: Internal Medicine | Admitting: Internal Medicine

## 2011-04-16 ENCOUNTER — Ambulatory Visit (INDEPENDENT_AMBULATORY_CARE_PROVIDER_SITE_OTHER): Payer: Medicare Other | Admitting: Internal Medicine

## 2011-04-16 VITALS — BP 102/54 | HR 80 | Temp 98.2°F | Ht 71.5 in | Wt 191.2 lb

## 2011-04-16 DIAGNOSIS — R059 Cough, unspecified: Secondary | ICD-10-CM | POA: Insufficient documentation

## 2011-04-16 DIAGNOSIS — J449 Chronic obstructive pulmonary disease, unspecified: Secondary | ICD-10-CM | POA: Insufficient documentation

## 2011-04-16 DIAGNOSIS — F17200 Nicotine dependence, unspecified, uncomplicated: Secondary | ICD-10-CM | POA: Insufficient documentation

## 2011-04-16 DIAGNOSIS — R05 Cough: Secondary | ICD-10-CM

## 2011-04-16 DIAGNOSIS — J4489 Other specified chronic obstructive pulmonary disease: Secondary | ICD-10-CM | POA: Insufficient documentation

## 2011-04-16 DIAGNOSIS — J984 Other disorders of lung: Secondary | ICD-10-CM | POA: Insufficient documentation

## 2011-04-16 NOTE — Progress Notes (Signed)
Subjective:     Patient ID: [name], male   DOB: 12-24-1933, 78 y.o.   MRN: [id number]  HPI [name] is a 78 yr old male here today with c/o that he has fluid coming up into his mouth.  Her says sometimes he will have drainage from his eyes x a couple of times. Symptoms x 3 yrs.Cough is worse at night.  He tells me he is coughing up a very thick mucous.  Appetite is good. No weight loss. No abdominal pain. BMs are okay. No melena or bright red rectal bleeding.  His stools were loose today. No fever.  He is actually not having any GI problems. He has not smoked a cigarettes x 2 -3 days.  Usually a pack every couple of weeks.    07/16/2011 EGD/Colonoscopy:FINAL DIAGNOSES:  1. Normal esophagogastroduodenoscopy.  2. Colonoscopy completed to cecum.  3. Multiple diverticula at sigmoid colon.  4. A 6-mm adenomatous polyp snared from the rectum  05/20/2010 CT abdomen/pelvis with NW:GNFAO hiatal hernia.  Aneurysmal dilatation of the common iliac arteries bilaterally with  scattered aorto-iliac atherosclerotic plaque disease and question  old dissection of the abdominal aorta.  Sigmoid diverticulitis without evidence of perforation or abscess.  Bilateral renal cysts.  Probable bilateral old AVN of the femoral heads.   Review of Systems see hpi Current Outpatient Prescriptions  Medication Sig Dispense Refill  . albuterol (PROVENTIL HFA) 108 (90 BASE) MCG/ACT inhaler Inhale 2 puffs into the lungs as needed.        Marland Kitchen aspirin 81 MG EC tablet Take 81 mg by mouth daily.        . diazepam (VALIUM) 5 MG tablet Take 5 mg by mouth every 6 (six) hours as needed.        Marland Kitchen levothyroxine (SYNTHROID, LEVOTHROID) 100 MCG tablet Take 100 mcg by mouth daily.        Marland Kitchen lisinopril-hydrochlorothiazide (PRINZIDE,ZESTORETIC) 20-12.5 MG per tablet Take 1 tablet by mouth daily.        . metoprolol tartrate (LOPRESSOR) 25 MG tablet Take 25 mg by mouth daily. Take 1/2 tab bid       . nitroGLYCERIN (NITROSTAT) 0.4 MG SL  tablet Place 0.4 mg under the tongue every 5 (five) minutes as needed.        . pravastatin (PRAVACHOL) 40 MG tablet Take 40 mg by mouth at bedtime.        . TOLBUTamide (ORINASE) 500 MG tablet Take 500 mg by mouth daily. Take 1/2 tablet daily       . HYDROcodone-acetaminophen (VICODIN ES) 7.5-750 MG per tablet Take 1 tablet by mouth as needed.        . lansoprazole (PREVACID) 30 MG capsule Take 30 mg by mouth daily.        Past Medical History  Diagnosis Date  . Hyperlipidemia   . Hypertension   . Hypothyroid   . COPD (chronic obstructive pulmonary disease)   . Anemia   . ASCVD (arteriosclerotic cardiovascular disease)      Inferior myocardial infarction in 08/1998 requiring PCI of the RCA; moderate residual disease in the left anterior descending and first diagonal; ejection fraction of 45%  . Cerebrovascular disease      Right carotid bruit; plaque without stenosis in 2003 and 2005  . Diabetes mellitus     A1c-7.7 in 2004 with diet-controlled; 6.3 and 11/08 on oral medication  . Tobacco abuse     Consumption tapered to 2 packs per week  . Elevated  PSA     prior negative prostate biopsy  . Alcohol abuse, in remission    Past Surgical History  Procedure Date  . Nasal sinus surgery   . Ulnar nerve repair     right arm  . Dental surgery     Right jaw; continued chronic pain  . Colonoscopy 06/2010    Dr. Karilyn Cota   Family Status  Relation Status Death Age  . Mother Alive     Depression  . Father Deceased    No Known Allergies       Objective:   Physical Exam Filed Vitals:   04/16/11 1433  Height: 5' 11.5" (1.816 m)  Weight: 191 lb 3.2 oz (86.728 kg)    Alert and oriented. Skin warm and dry. Oral mucosa is moist.   . Sclera anicteric, conjunctivae is pink. Thyroid not enlarged. No cervical lymphadenopathy. Lungs clear. Heart regular rate and rhythm.  Abdomen is soft. Bowel sounds are positive. No hepatomegaly. No abdominal masses felt. No tenderness.  No edema to lower  extremities. Patient is alert and oriented.     Assessment:    Probable COPD/Bronchitis     Plan:    Will obtain a CXR . Further recommendations to follow.

## 2011-04-16 NOTE — Patient Instructions (Signed)
Chest xray. Further recommendations to follow.  

## 2011-05-08 ENCOUNTER — Telehealth: Payer: Self-pay | Admitting: Cardiology

## 2011-05-08 NOTE — Telephone Encounter (Signed)
Patient states that he has taken blood pressure multiple times a day and is getting readings from 108 to 130's without symptoms.  Advised him to check only once or twice daily and call us if he develops concerning symptoms.

## 2011-05-08 NOTE — Telephone Encounter (Signed)
Patient has concerns with his BP. / tg

## 2011-05-16 ENCOUNTER — Telehealth: Payer: Self-pay | Admitting: *Deleted

## 2011-05-16 NOTE — Telephone Encounter (Signed)
Patient presented to office today, thinking that his appointment was for today, rather than June 3rd.  Left blood pressure readings which range in the one teen -120 range systolic.  Advised patient to keep appointment in June.

## 2011-06-15 ENCOUNTER — Encounter: Payer: Self-pay | Admitting: Cardiology

## 2011-06-16 ENCOUNTER — Encounter: Payer: Self-pay | Admitting: Cardiology

## 2011-06-16 ENCOUNTER — Ambulatory Visit (INDEPENDENT_AMBULATORY_CARE_PROVIDER_SITE_OTHER): Payer: Medicare Other | Admitting: Cardiology

## 2011-06-16 VITALS — BP 158/69 | HR 56 | Resp 16 | Ht 71.0 in | Wt 189.0 lb

## 2011-06-16 DIAGNOSIS — I679 Cerebrovascular disease, unspecified: Secondary | ICD-10-CM

## 2011-06-16 DIAGNOSIS — I2510 Atherosclerotic heart disease of native coronary artery without angina pectoris: Secondary | ICD-10-CM

## 2011-06-16 DIAGNOSIS — I739 Peripheral vascular disease, unspecified: Secondary | ICD-10-CM

## 2011-06-16 DIAGNOSIS — E785 Hyperlipidemia, unspecified: Secondary | ICD-10-CM

## 2011-06-16 DIAGNOSIS — I7090 Unspecified atherosclerosis: Secondary | ICD-10-CM

## 2011-06-16 DIAGNOSIS — I10 Essential (primary) hypertension: Secondary | ICD-10-CM

## 2011-06-16 DIAGNOSIS — E039 Hypothyroidism, unspecified: Secondary | ICD-10-CM

## 2011-06-16 NOTE — Assessment & Plan Note (Signed)
Repeat carotid ultrasound in 2012 indicated increased plaque burden, but no focal stenosis.  Study will be repeated in one year.

## 2011-06-16 NOTE — Assessment & Plan Note (Signed)
No symptoms to suggest myocardial ischemia or CHF.  Our approach will be optimal control of cardiovascular risk factors.

## 2011-06-16 NOTE — Assessment & Plan Note (Signed)
Lipid profile 6 months ago was excellent; current medication will be continued.

## 2011-06-16 NOTE — Progress Notes (Deleted)
Name: [name]    DOB: 12-Sep-1933  Age: 78 y.o.  MR#: [id number]       PCP:  Milana Obey, MD, MD      Insurance: @PAYORNAME @   CC:    Chief Complaint  Patient presents with  . Appointment    SOB +med list    VS BP 158/69  Pulse 56  Resp 16  Ht 5\' 11"  (1.803 m)  Wt 189 lb (85.73 kg)  BMI 26.36 kg/m2  Weights Current Weight  06/16/11 189 lb (85.73 kg)  04/16/11 191 lb 3.2 oz (86.728 kg)  06/12/10 166 lb (75.297 kg)    Blood Pressure  BP Readings from Last 3 Encounters:  06/16/11 158/69  04/16/11 102/54  06/12/10 155/62     Admit date:  (Not on file) Last encounter with RMR:  06/15/2011   Allergy No Known Allergies  Current Outpatient Prescriptions  Medication Sig Dispense Refill  . albuterol (PROVENTIL HFA) 108 (90 BASE) MCG/ACT inhaler Inhale 2 puffs into the lungs as needed.        Marland Kitchen aspirin 81 MG EC tablet Take 81 mg by mouth daily.        . diazepam (VALIUM) 5 MG tablet Take 5 mg by mouth every 6 (six) hours as needed.        Marland Kitchen HYDROcodone-acetaminophen (VICODIN ES) 7.5-750 MG per tablet Take 1 tablet by mouth as needed.        . lansoprazole (PREVACID) 30 MG capsule Take 30 mg by mouth daily.       Marland Kitchen levothyroxine (SYNTHROID, LEVOTHROID) 100 MCG tablet Take 100 mcg by mouth daily.        Marland Kitchen lisinopril-hydrochlorothiazide (PRINZIDE,ZESTORETIC) 20-12.5 MG per tablet Take 1 tablet by mouth daily.        . metoprolol tartrate (LOPRESSOR) 25 MG tablet Take 25 mg by mouth daily. Take 1/2 tab bid       . nitroGLYCERIN (NITROSTAT) 0.4 MG SL tablet Place 0.4 mg under the tongue every 5 (five) minutes as needed.        . pravastatin (PRAVACHOL) 40 MG tablet Take 40 mg by mouth at bedtime.        . TOLBUTamide (ORINASE) 500 MG tablet Take 500 mg by mouth daily. Take 1/2 tablet daily         Discontinued Meds:   There are no discontinued medications.  Patient Active Problem List  Diagnoses  . HYPOTHYROIDISM  . CHRONIC OBSTRUCTIVE PULMONARY DISEASE  .  HYPERLIPIDEMIA  . Hypertension  . Anemia  . ASCVD (arteriosclerotic cardiovascular disease)  . Cerebrovascular disease  . Diabetes mellitus  . Tobacco abuse  . Elevated PSA  . Alcohol abuse, in remission  . Peripheral vascular disease    LABS No visits with results within 3 Month(s) from this visit. Latest known visit with results is:  Admission on 07/16/2010, Discharged on 07/16/2010  Component Date Value  . Glucose-Capillary 07/16/2010 107*     Results for this Opt Visit:     Results for orders placed during the hospital encounter of 07/16/10  GLUCOSE, CAPILLARY      Component Value Range   Glucose-Capillary 107 (*) 70 - 99 (mg/dL)    EKG Orders placed during the hospital encounter of 05/30/10  . EKG  . EKG     Prior Assessment and Plan Problem List as of 06/16/2011          Cardiology Problems   HYPERLIPIDEMIA   Last Assessment & Plan Note  06/12/2010 Office Visit Signed 06/12/2010 12:36 PM by Kathlen Brunswick, MD    Excellent control of hyperlipidemia.  Gemfibrozil is not necessarily provided additional benefit and will be discontinued.    Hypertension   Last Assessment & Plan Note   05/13/2010 Office Visit Signed 05/13/2010  3:49 PM by Jodelle Gross, NP    Mr. Gilbert is not compliant with his medications concerning dosing as prescribed.  He refuses amlodipine. I have advised him to take lisinopril/HCTZ 20/12.5mg  daily in the am. He can continue to take metoprolol 12.5mg  BID as his BP recordings do suggest bradycardia on higher dose.  He will continue to record his BP and follow-up in one month. I will have him do lab work BMET at that time to evaluate renal fx on HCTZ with diabetes. He verbalizes understanding and is willing to try the new regimen.  If he becomes symptomatic he will call.    ASCVD (arteriosclerotic cardiovascular disease)   Last Assessment & Plan Note   06/12/2010 Office Visit Signed 06/12/2010 12:30 PM by Kathlen Brunswick, MD    Mr. Eichhorst  continues to do extremely well with respect to coronary disease with no symptoms to suggest myocardial ischemia or CHF.    Cerebrovascular disease   Last Assessment & Plan Note   06/12/2010 Office Visit Addendum 06/12/2010 12:35 PM by Kathlen Brunswick, MD    Patient has had a right carotid bruit for years without neurologic symptoms and with modest atherosclerosis in 2005.  A recent repeat study(05/2010) again revealed no significant focal stenosis, but there was a substantial increase in overall plaque burden and possible ulceration in the left internal carotid artery.    Peripheral vascular disease     Other   HYPOTHYROIDISM   CHRONIC OBSTRUCTIVE PULMONARY DISEASE   Anemia   Last Assessment & Plan Note   06/12/2010 Office Visit Signed 06/12/2010 12:36 PM by Kathlen Brunswick, MD    Anemia assessment and treatment will be managed by Dr. Sherwood Gambler    Diabetes mellitus   Tobacco abuse   Elevated PSA   Alcohol abuse, in remission       Imaging: No results found.   FRS Calculation: Score not calculated. Missing: Total Cholesterol

## 2011-06-16 NOTE — Assessment & Plan Note (Addendum)
Patient monitors blood pressures at home and reports systolics typically range from 95-130 while diastolics are never elevated.  Current therapy will be continued.

## 2011-06-16 NOTE — Patient Instructions (Signed)
**Note De-identified  Obfuscation** Your physician has requested that you have a carotid duplex. This test is an ultrasound of the carotid arteries in your neck. It looks at blood flow through these arteries that supply the brain with blood. Allow one hour for this exam. There are no restrictions or special instructions.  Your physician recommends that you continue on your current medications as directed. Please refer to the Current Medication list given to you today.  Your physician recommends that you schedule a follow-up appointment in: 1 year  

## 2011-06-16 NOTE — Progress Notes (Signed)
Patient ID: [name], male   DOB: 04-03-1933, 78 y.o.   MRN: [id number]  HPI: Scheduled return visit for this very nice gentleman with coronary artery disease, nonobstructive cerebrovascular disease and multiple cardiovascular risk factors.  Since his last visit, he has done fairly well.  He was evaluated for excess respiratory secretions with a diagnosis of chronic obstructive pulmonary disease established.  Upper and lower endoscopy were apparently negative.  He remains active, performing some yard work, without any cardiopulmonary symptoms.  Prior to Admission medications   Medication Sig Start Date End Date Taking? Authorizing Provider  albuterol (PROVENTIL HFA) 108 (90 BASE) MCG/ACT inhaler Inhale 2 puffs into the lungs as needed.     Yes Historical Provider, MD  aspirin 81 MG EC tablet Take 81 mg by mouth daily.     Yes Historical Provider, MD  diazepam (VALIUM) 5 MG tablet Take 5 mg by mouth every 6 (six) hours as needed.     Yes Historical Provider, MD  HYDROcodone-acetaminophen (VICODIN ES) 7.5-750 MG per tablet Take 1 tablet by mouth as needed.     Yes Historical Provider, MD  lansoprazole (PREVACID) 30 MG capsule Take 30 mg by mouth daily.  05/21/10  Yes Historical Provider, MD  levothyroxine (SYNTHROID, LEVOTHROID) 100 MCG tablet Take 100 mcg by mouth daily.     Yes Historical Provider, MD  lisinopril-hydrochlorothiazide (PRINZIDE,ZESTORETIC) 20-12.5 MG per tablet Take 1 tablet by mouth daily.     Yes Historical Provider, MD  metoprolol tartrate (LOPRESSOR) 25 MG tablet Take 25 mg by mouth daily. Take 1/2 tab bid    Yes Historical Provider, MD  nitroGLYCERIN (NITROSTAT) 0.4 MG SL tablet Place 0.4 mg under the tongue every 5 (five) minutes as needed.     Yes Historical Provider, MD  pravastatin (PRAVACHOL) 40 MG tablet Take 40 mg by mouth at bedtime.     Yes Historical Provider, MD  TOLBUTamide (ORINASE) 500 MG tablet Take 500 mg by mouth daily. Take 1/2 tablet daily    Yes Historical  Provider, MD   No Known Allergies    Past medical history, social history, and family history reviewed and updated.  ROS: Denies chest discomfort, dyspnea on exertion, orthopnea, PND, claudication or chronic cough.  All other systems reviewed and are negative.  PHYSICAL EXAM: BP 158/69  Pulse 56  Resp 16  Ht 5\' 11"  (1.803 m)  Wt 85.73 kg (189 lb)  BMI 26.36 kg/m2 ; repeat blood pressure 130/40 General-Well developed; no acute distress Body habitus-proportionate weight and height Neck-No JVD; Right carotid bruit Lungs-clear lung fields; resonant to percussion Cardiovascular-normal PMI; normal S1 and S2; S4 present Abdomen-normal bowel sounds; soft and non-tender without masses or organomegaly Musculoskeletal-No deformities, no cyanosis or clubbing Neurologic-Normal cranial nerves; symmetric strength and tone Skin-Warm, no significant lesions Extremities-2+ right and 1+ left pulses; Trace ankle edema  ASSESSMENT AND PLAN:  [place] Bing, MD 06/16/2011 12:55 PM

## 2011-09-30 ENCOUNTER — Ambulatory Visit (HOSPITAL_COMMUNITY)
Admission: RE | Admit: 2011-09-30 | Discharge: 2011-09-30 | Disposition: A | Discharge status: Home / Self Care | Payer: Medicare Other | Source: Ambulatory Visit | Attending: Cardiology | Admitting: Cardiology

## 2011-09-30 DIAGNOSIS — I2510 Atherosclerotic heart disease of native coronary artery without angina pectoris: Secondary | ICD-10-CM | POA: Insufficient documentation

## 2011-09-30 DIAGNOSIS — I6529 Occlusion and stenosis of unspecified carotid artery: Secondary | ICD-10-CM | POA: Insufficient documentation

## 2011-09-30 DIAGNOSIS — I739 Peripheral vascular disease, unspecified: Secondary | ICD-10-CM | POA: Insufficient documentation

## 2011-10-01 ENCOUNTER — Encounter: Payer: Self-pay | Admitting: Cardiology

## 2011-10-08 ENCOUNTER — Encounter: Payer: Self-pay | Admitting: Cardiology

## 2011-10-21 ENCOUNTER — Ambulatory Visit (INDEPENDENT_AMBULATORY_CARE_PROVIDER_SITE_OTHER): Payer: Medicare Other | Admitting: Internal Medicine

## 2011-10-21 ENCOUNTER — Encounter: Payer: Self-pay | Admitting: Internal Medicine

## 2011-10-21 VITALS — BP 130/60 | HR 73 | Ht 67.5 in | Wt 201.8 lb

## 2011-10-21 DIAGNOSIS — K219 Gastro-esophageal reflux disease without esophagitis: Secondary | ICD-10-CM

## 2011-10-21 DIAGNOSIS — J449 Chronic obstructive pulmonary disease, unspecified: Secondary | ICD-10-CM

## 2011-10-21 DIAGNOSIS — F17200 Nicotine dependence, unspecified, uncomplicated: Secondary | ICD-10-CM

## 2011-10-21 DIAGNOSIS — J42 Unspecified chronic bronchitis: Secondary | ICD-10-CM

## 2011-10-21 DIAGNOSIS — Z720 Tobacco use: Secondary | ICD-10-CM

## 2011-10-21 NOTE — Progress Notes (Signed)
10/21/11- 79 yoM former smoker self referred for productive cough x3 years. He dates this to around the time of some extensive dental work but connection would be unclear. He denies wheeze or rattle. Spiriva and Symbicort did not help. Prednisone just made him weak He had pulmonary evaluation by Dr. Juanetta Gosling. Upper endoscopy was "clear". BCBS denied chest CT. Thick white mucus seems to burn in his mouth; production is copious and increased by bending over.  He denies ongoing heartburn or peripheral edema. He exercises at a gym. As a child he had pneumonia and diphtheria. History of nasal surgery for obstruction. Cardiac history of MI, HBP, angioplasty without bypass graft for pacemaker. Smoked one pack per day for 60 years, quitting 6 months ago. CXR 04/16/11- reviewed with him IMPRESSION:  Stable COPD. No acute findings.  Original Report Authenticated By: Reola Calkins, M.D.   Prior to Admission medications   Medication Sig Start Date End Date Taking? Authorizing Provider  albuterol (PROVENTIL HFA) 108 (90 BASE) MCG/ACT inhaler Inhale 2 puffs into the lungs as needed.     Yes Historical Provider, MD  ALPRAZolam Prudy Feeler) 1 MG tablet Take 1 tablet by mouth Three times daily as needed. 10/04/11  Yes Historical Provider, MD  aspirin 81 MG EC tablet Take 81 mg by mouth daily.     Yes Historical Provider, MD  diazepam (VALIUM) 5 MG tablet Take 5 mg by mouth every 6 (six) hours as needed.     Yes Historical Provider, MD  HYDROcodone-acetaminophen (VICODIN ES) 7.5-750 MG per tablet Take 1 tablet by mouth as needed.     Yes Historical Provider, MD  levothyroxine (SYNTHROID, LEVOTHROID) 100 MCG tablet Take 100 mcg by mouth daily.     Yes Historical Provider, MD  lisinopril-hydrochlorothiazide (PRINZIDE,ZESTORETIC) 20-12.5 MG per tablet Take 1 tablet by mouth daily.     Yes Historical Provider, MD  metoprolol tartrate (LOPRESSOR) 25 MG tablet Take 1/2 tab bid    Yes Historical Provider, MD  niacin 500 MG  tablet Take 500 mg by mouth daily with breakfast.   Yes Historical Provider, MD  nitroGLYCERIN (NITROSTAT) 0.4 MG SL tablet Place 0.4 mg under the tongue every 5 (five) minutes as needed.     Yes Historical Provider, MD  pravastatin (PRAVACHOL) 40 MG tablet Take 40 mg by mouth at bedtime.     Yes Historical Provider, MD  TOLBUTamide (ORINASE) 500 MG tablet Take 1/2 tablet daily   Yes Historical Provider, MD  cloNIDine (CATAPRES) 0.1 MG tablet  09/22/11   Historical Provider, MD   Past Medical History  Diagnosis Date  . Hyperlipidemia   . Hypertension   . Hypothyroid   . COPD (chronic obstructive pulmonary disease)   . Anemia   . ASCVD (arteriosclerotic cardiovascular disease)      Inferior myocardial infarction in 08/1998 requiring PCI of the RCA; moderate residual disease in the left anterior descending and first diagonal; ejection fraction of 45%  . Cerebrovascular disease      Right carotid bruit; plaque without stenosis in 2003 and 2005  . Diabetes mellitus     A1c-7.7 in 2004 with diet-controlled; 6.3 and 11/08 on oral medication  . Tobacco abuse     Consumption tapered to 2 packs per week  . Elevated PSA     prior negative prostate biopsy  . Alcohol abuse, in remission    Past Surgical History  Procedure Date  . Nasal sinus surgery   . Ulnar nerve repair  right arm  . Dental surgery     Right jaw; continued chronic pain  . Colonoscopy 06/2010    Dr. Karilyn Cota   Family History  Problem Relation Age of Onset  . Hypertension Mother   . Heart disease Father   . Hyperlipidemia Father   . Hypertension Father   . Diabetes Father    History   Social History  . Marital Status: Married    Spouse Name: N/A    Number of Children: N/A  . Years of Education: N/A   Occupational History  . retired    Social History Main Topics  . Smoking status: Former Smoker -- 1.0 packs/day for 60 years    Types: Cigarettes    Quit date: 04/21/2011  . Smokeless tobacco: Never Used  .  Alcohol Use: No     Former Abuse  . Drug Use: No  . Sexually Active: Not on file   Other Topics Concern  . Not on file   Social History Narrative  . No narrative on file   ROS-see HPI Constitutional:   No-   weight loss, night sweats, fevers, chills, fatigue, lassitude. HEENT:   No-  headaches, difficulty swallowing, tooth/dental problems, sore throat,       No-  sneezing, itching, ear ache, nasal congestion, post nasal drip,  CV:  No-   chest pain, orthopnea, PND, swelling in lower extremities, anasarca, dizziness, palpitations Resp: No-   shortness of breath with exertion or at rest.              +  productive cough,  No non-productive cough,  No- coughing up of blood.              No-   change in color of mucus.  No- wheezing.   Skin: No-   rash or lesions. GI:  No-   heartburn, indigestion, abdominal pain, nausea, vomiting, diarrhea,                 change in bowel habits, loss of appetite GU: No-   dysuria, change in color of urine, no urgency or frequency.  No- flank pain. MS:  No-   joint pain or swelling.  No- decreased range of motion.  No- back pain. Neuro-     nothing unusual Psych:  No- change in mood or affect. No depression or anxiety.  No memory loss.  OBJ- Physical Exam General- Alert, Oriented, Affect-appropriate, Distress- none acute Skin- rash-none, lesions- none, excoriation- none Lymphadenopathy- none Head- atraumatic            Eyes- Gross vision intact, PERRLA, conjunctivae and secretions clear            Ears- Hearing, canals-normal            Nose- Clear, no-Septal dev, mucus, polyps, erosion, perforation             Throat- Mallampati II , mucosa clear , drainage- none, tonsils- atrophic; +mild hoarseness Neck- flexible , trachea midline, no stridor , thyroid nl, carotid no bruit Chest - symmetrical excursion , unlabored           Heart/CV- RRR , no murmur , no gallop  , no rub, nl s1 s2                           - JVD- none , edema- none, stasis  changes- none, varices- none  Lung- + few crackles right base, wheeze- none, cough- none , dullness-none, rub- none           Chest wall-  Abd- tender-no, distended-no, bowel sounds-present, HSM- no Br/ Gen/ Rectal- Not done, not indicated Extrem- cyanosis- none, clubbing, none, atrophy- none, strength- nl Neuro- grossly intact to observation

## 2011-10-21 NOTE — Patient Instructions (Addendum)
Order- schedule barium swallow     Dx esophageal reflux  Order - schedule PFT   Dx chronic bronchitis  Sample Tudorza inhaler-  1 puff twice daily

## 2011-10-24 ENCOUNTER — Ambulatory Visit (HOSPITAL_COMMUNITY)
Admission: RE | Admit: 2011-10-24 | Discharge: 2011-10-24 | Disposition: A | Discharge status: Home / Self Care | Payer: Medicare Other | Source: Ambulatory Visit | Attending: Internal Medicine | Admitting: Internal Medicine

## 2011-10-24 ENCOUNTER — Ambulatory Visit (INDEPENDENT_AMBULATORY_CARE_PROVIDER_SITE_OTHER): Payer: Medicare Other | Admitting: Internal Medicine

## 2011-10-24 DIAGNOSIS — K219 Gastro-esophageal reflux disease without esophagitis: Secondary | ICD-10-CM

## 2011-10-24 DIAGNOSIS — J42 Unspecified chronic bronchitis: Secondary | ICD-10-CM

## 2011-10-24 DIAGNOSIS — K224 Dyskinesia of esophagus: Secondary | ICD-10-CM | POA: Insufficient documentation

## 2011-10-24 DIAGNOSIS — K117 Disturbances of salivary secretion: Secondary | ICD-10-CM | POA: Insufficient documentation

## 2011-10-24 NOTE — Progress Notes (Signed)
PFT done today. 

## 2011-10-27 NOTE — Assessment & Plan Note (Signed)
Specific complaint is productive cough with than usual pattern. It is not clearly associated either with airways or with esophagus. . I suspect he is describing a combination of chronic bronchitis and esophageal reflux, and possibly also some sialorrhea. Plan-PFT, barium swallow, sample Tudorza for drying effect.

## 2011-10-27 NOTE — Assessment & Plan Note (Signed)
Strongly encouraged to remain completely off of cigarettes 

## 2011-10-29 ENCOUNTER — Telehealth: Payer: Self-pay | Admitting: Internal Medicine

## 2011-10-29 NOTE — Telephone Encounter (Signed)
Pt calling requesting PFT results and would like some additional details on his barium swallow results.  Barium swallow results per CY: Notes Recorded by Waymon Budge, MD on 10/24/2011 at 1:17 PM Poor esophageal motility, probably age related. The muscle movement in the esophagus doesn't coordinate well to move food along. There is no blockage or other abnormality seen.  Katie/Dr Maple Hudson please advise, thanks.  Pt irequesting call back tomorrow morning.

## 2011-10-29 NOTE — Progress Notes (Signed)
Quick Note:  Pt aware of results. ______ 

## 2011-10-30 NOTE — Telephone Encounter (Signed)
Pt aware that he will get PFT results at next OV and is wondering if there is anything he can do or take to help with swallowing issues.

## 2011-10-30 NOTE — Telephone Encounter (Signed)
Per CY-lets send this result to PCP and allow him/her to follow patient and refer out if needed. Pt is aware and results sent to PCP on file(confirmed with patient).

## 2011-11-03 ENCOUNTER — Telehealth: Payer: Self-pay | Admitting: Internal Medicine

## 2011-11-03 NOTE — Telephone Encounter (Signed)
I spoke with pt and he was not calling for xray results. He stated he has white substance in his mouth, it burns/hurts, and throat is sore x 2 days. He is requesting to have something called in for this. Please advise Dr. Maple Hudson thanks  Allergies  Allergen Reactions  . Prednisone     Doesn't like to take high doses

## 2011-11-03 NOTE — Telephone Encounter (Signed)
Per Dr Maple Hudson, patient to take Diflucan 150mg  #5   Take 1 daily x 5 days. -0RF  ATC x 1 no answer//no machine. Need to try again later. Will forward to triage to try to contact again later.

## 2011-11-04 MED ORDER — FLUCONAZOLE 150 MG PO TABS: 150.0000 mg | ORAL_TABLET | Freq: Once | ORAL | Status: DC

## 2011-11-04 NOTE — Telephone Encounter (Signed)
I spoke with pt and is aware of CDY recs. He voiced his understanding and I have sent RX into the pharmacy. Nothing further was needed

## 2011-11-04 NOTE — Telephone Encounter (Signed)
ATC pt line busy x 3 wcb 

## 2011-11-11 ENCOUNTER — Telehealth: Payer: Self-pay | Admitting: Internal Medicine

## 2011-11-11 DIAGNOSIS — R0600 Dyspnea, unspecified: Secondary | ICD-10-CM

## 2011-11-11 DIAGNOSIS — J449 Chronic obstructive pulmonary disease, unspecified: Secondary | ICD-10-CM

## 2011-11-11 NOTE — Telephone Encounter (Signed)
Called, spoke with pt. Informed him of below per Dr. Maple Hudson.  He verbalized understanding and will pick up med from pharm.  Requesting CT Chest d/t his symptoms --- Dr. Maple Hudson, pls advise.  Thank you.

## 2011-11-11 NOTE — Telephone Encounter (Signed)
Per phone msg on 11/03/11:  Nita Sells, CMA 11/03/2011 11:05 AM Signed  Per Dr Maple Hudson, patient to take Diflucan 150mg  #5 Take 1 daily x 5 days. -0RF  -----  Called, spoke with pt who states the Diflucan did not help.  Reports he is "spitting up" a bunch of white mucus and his mouth is sore and burns after talking a lot.  Denies trouble swallowing, increased SOB, wheezing, chest tightness, or cough.  Requesting further recs.  Dr. Maple Hudson, pls advise.  Thank you.  [facility]  Last OV with Dr. Maple Hudson: 10/21/11  Allergies  Allergen Reactions  . Prednisone     Doesn't like to take high doses

## 2011-11-11 NOTE — Telephone Encounter (Signed)
I would like him to be using an acid blocker because of the complaint of burning in mouth. Suggest otc like omeprazole or Pepcid daily- take before a meal.

## 2011-11-11 NOTE — Telephone Encounter (Signed)
Orders placed.  Called, spoke with pt.  He is aware to come have labs done prior to have CT Chest and someone will be calling him to scheduled CT Chest.  He verbalized understanding.

## 2011-11-11 NOTE — Telephone Encounter (Signed)
Per CY-order CT chest with contrast DX Dyspnea and COPD. Will need BMET prior to having CT.

## 2011-11-12 ENCOUNTER — Other Ambulatory Visit (INDEPENDENT_AMBULATORY_CARE_PROVIDER_SITE_OTHER): Payer: Medicare Other

## 2011-11-12 DIAGNOSIS — R0609 Other forms of dyspnea: Secondary | ICD-10-CM

## 2011-11-12 DIAGNOSIS — R0600 Dyspnea, unspecified: Secondary | ICD-10-CM

## 2011-11-12 DIAGNOSIS — J449 Chronic obstructive pulmonary disease, unspecified: Secondary | ICD-10-CM

## 2011-11-12 DIAGNOSIS — J4489 Other specified chronic obstructive pulmonary disease: Secondary | ICD-10-CM

## 2011-11-12 LAB — BASIC METABOLIC PANEL
CO2: 31 mEq/L (ref 19–32)
Chloride: 95 mEq/L — ABNORMAL LOW (ref 96–112)
Glucose, Bld: 93 mg/dL (ref 70–99)
Potassium: 4.7 mEq/L (ref 3.5–5.1)
Sodium: 132 mEq/L — ABNORMAL LOW (ref 135–145)

## 2011-11-13 ENCOUNTER — Ambulatory Visit (INDEPENDENT_AMBULATORY_CARE_PROVIDER_SITE_OTHER)
Admission: RE | Admit: 2011-11-13 | Discharge: 2011-11-13 | Disposition: A | Discharge status: Home / Self Care | Payer: Medicare Other | Source: Ambulatory Visit | Attending: Internal Medicine | Admitting: Internal Medicine

## 2011-11-13 DIAGNOSIS — R0609 Other forms of dyspnea: Secondary | ICD-10-CM

## 2011-11-13 DIAGNOSIS — R0600 Dyspnea, unspecified: Secondary | ICD-10-CM

## 2011-11-13 DIAGNOSIS — J449 Chronic obstructive pulmonary disease, unspecified: Secondary | ICD-10-CM

## 2011-11-13 MED ORDER — IOHEXOL 300 MG/ML  SOLN
80.0000 mL | Freq: Once | INTRAMUSCULAR | Status: AC | PRN
  Administered 2011-11-13: 80 mL via INTRAVENOUS

## 2011-11-13 NOTE — Progress Notes (Signed)
Quick Note:  Pt aware of results. ______ 

## 2011-12-05 ENCOUNTER — Encounter: Payer: Self-pay | Admitting: Internal Medicine

## 2011-12-05 ENCOUNTER — Ambulatory Visit (INDEPENDENT_AMBULATORY_CARE_PROVIDER_SITE_OTHER): Payer: Medicare Other | Admitting: Internal Medicine

## 2011-12-05 VITALS — BP 122/70 | HR 58 | Ht 67.5 in | Wt 206.6 lb

## 2011-12-05 DIAGNOSIS — I2510 Atherosclerotic heart disease of native coronary artery without angina pectoris: Secondary | ICD-10-CM

## 2011-12-05 DIAGNOSIS — J449 Chronic obstructive pulmonary disease, unspecified: Secondary | ICD-10-CM

## 2011-12-05 DIAGNOSIS — I7090 Unspecified atherosclerosis: Secondary | ICD-10-CM

## 2011-12-05 NOTE — Patient Instructions (Addendum)
Sample Breo Ellipta     1 puff then rinse mouth, once daily  Elevate the head end of your bed with a brick under each head leg. This will help keep your stomach juice down hill so it isn't contributing your cough  Lisinopril can be aggravating your cough. Suggest you ask Dr Sudie Bailey about changing it to an ARB or some other class of medicine

## 2011-12-05 NOTE — Progress Notes (Signed)
10/21/11- 35 yoM former smoker self referred for productive cough x3 years. He dates this to around the time of some extensive dental work but connection would be unclear. He denies wheeze or rattle. Spiriva and Symbicort did not help. Prednisone just made him weak He had pulmonary evaluation by Dr. Juanetta Gosling. Upper endoscopy was "clear". BCBS denied chest CT. Thick white mucus seems to burn in his mouth; production is copious and increased by bending over.  He denies ongoing heartburn or peripheral edema. He exercises at a gym. As a child he had pneumonia and diphtheria. History of nasal surgery for obstruction. Cardiac history of MI, HBP, angioplasty without bypass graft for pacemaker. Smoked one pack per day for 60 years, quitting 6 months ago. CXR 04/16/11- reviewed with him IMPRESSION:  Stable COPD. No acute findings.  Original Report Authenticated By: Reola Calkins, M.D.   12/05/11- 32 yoM former smoker self referred for productive cough x3 years. He dates this to around the time of some extensive dental work but connection would be unclear. He denies wheeze or rattle. Spiriva and Symbicort did not help. Prednisone just made him weak He had pulmonary evaluation by Dr. Juanetta Gosling. Upper endoscopy was "clear". BCBS denied chest CT. FOLLOWS FOR: cough-productive at times-white pale color; denies any chest congestion, SOB, or wheezing.  Daughter here He recognizes no change, no effect of the weather. Dyspnea with any exertion. Thick mucus, mostly clear with no blood. Metered inhalers did not help. PFT: 10/24/2011-moderate obstructive airways disease with insignificant response to bronchodilator, mild restriction and mild reduction in diffusion capacity. FEV1 1.61/57%, FEV1/FVC 0.57, TLC 77%, DLCO 75% CT chest10/31/13-reviewed with him  IMPRESSION:  1. Moderate centrilobular emphysema, without other explanation for  shortness of breath.  2. Extensive atherosclerosis, including within the coronary    arteries and throughout the abdominal aorta. Extensive ulcerative  plaque throughout the aorta. Abnormality along the origin of the  left renal artery which could represent focal renal artery ectasia  or an adjacent aortic focal aneurysm or penetrating ulcer.  3. Prominent mediastinal nodes, favored to be reactive. Follow-up  CT in 3-6 months could confirm stability or resolution.  Original Report Authenticated By: Jeronimo Greaves, M.D.   Ba Swallow 10/24/11 IMPRESSION:  Nonspecific likely age related esophageal dysmotility disorder. No  stricture, reflux or reflux esophagitis identified.  Original Report Authenticated By: Andreas Newport, M.D.   ROS-see HPI Constitutional:   No-   weight loss, night sweats, fevers, chills, fatigue, lassitude. HEENT:   No-  headaches, difficulty swallowing, tooth/dental problems, sore throat,       No-  sneezing, itching, ear ache, nasal congestion, post nasal drip,  CV:  No-   chest pain, orthopnea, PND, swelling in lower extremities, anasarca, dizziness, palpitations Resp: + shortness of breath with exertion or at rest.              +  productive cough,  No non-productive cough,  No- coughing up of blood.              No-   change in color of mucus.  No- wheezing.   Skin: No-   rash or lesions. GI:  No-   heartburn, indigestion, abdominal pain, nausea, vomiting,  GU: . MS:  No-   joint pain or swelling.  Neuro-     nothing unusual Psych:  No- change in mood or affect. No depression or anxiety.  No memory loss.  OBJ- Physical Exam General- Alert, Oriented, Affect-appropriate, Distress- none acute Skin- rash-none,  lesions- none, excoriation- none Lymphadenopathy- none Head- atraumatic            Eyes- Gross vision intact, PERRLA, conjunctivae and secretions clear            Ears- Hearing, canals-normal            Nose- Clear, no-Septal dev, mucus, polyps, erosion, perforation             Throat- Mallampati II , mucosa clear , drainage- none,  tonsils- atrophic; +mild hoarseness Neck- flexible , trachea midline, no stridor , thyroid nl, carotid no bruit Chest - symmetrical excursion , unlabored           Heart/CV- RRR , no murmur , no gallop  , no rub, nl s1 s2                           - JVD-1 cm , edema- none, stasis changes- none, varices- none           Lung- diminished, + few crackles L>R base, wheeze- none, cough- none , dullness-none, rub- none           Chest wall-  Abd-  Br/ Gen/ Rectal- Not done, not indicated Extrem- cyanosis- none, clubbing, none, atrophy- none, strength- nl Neuro- grossly intact to observation

## 2011-12-20 NOTE — Assessment & Plan Note (Signed)
Primarily emphysema with little potential response to bronchodilators. Aggravating comorbidities would include his atherosclerotic disease and esophageal dysphagia which is probably associated with some reflux.

## 2012-03-01 ENCOUNTER — Encounter: Payer: Self-pay | Admitting: Cardiology

## 2012-03-01 DIAGNOSIS — N183 Chronic kidney disease, stage 3 (moderate): Secondary | ICD-10-CM

## 2012-03-18 ENCOUNTER — Other Ambulatory Visit: Payer: Self-pay | Admitting: Cardiology

## 2012-03-18 DIAGNOSIS — I2510 Atherosclerotic heart disease of native coronary artery without angina pectoris: Secondary | ICD-10-CM

## 2012-06-14 ENCOUNTER — Encounter: Payer: Self-pay | Admitting: Cardiology

## 2012-06-14 ENCOUNTER — Ambulatory Visit (HOSPITAL_COMMUNITY)
Admission: RE | Admit: 2012-06-14 | Discharge: 2012-06-14 | Disposition: A | Discharge status: Home / Self Care | Payer: Medicare Other | Source: Ambulatory Visit | Attending: Cardiology | Admitting: Cardiology

## 2012-06-14 DIAGNOSIS — I6529 Occlusion and stenosis of unspecified carotid artery: Secondary | ICD-10-CM | POA: Insufficient documentation

## 2012-06-14 DIAGNOSIS — I2510 Atherosclerotic heart disease of native coronary artery without angina pectoris: Secondary | ICD-10-CM

## 2012-06-15 ENCOUNTER — Other Ambulatory Visit: Payer: Self-pay | Admitting: *Deleted

## 2012-06-15 DIAGNOSIS — I25810 Atherosclerosis of coronary artery bypass graft(s) without angina pectoris: Secondary | ICD-10-CM

## 2012-06-17 ENCOUNTER — Ambulatory Visit: Payer: Medicare Other | Admitting: Cardiology

## 2012-07-06 ENCOUNTER — Ambulatory Visit (INDEPENDENT_AMBULATORY_CARE_PROVIDER_SITE_OTHER): Payer: Medicare Other | Admitting: Cardiology

## 2012-07-06 ENCOUNTER — Encounter: Payer: Self-pay | Admitting: Cardiology

## 2012-07-06 ENCOUNTER — Ambulatory Visit: Payer: Medicare Other | Admitting: Adult Health

## 2012-07-06 VITALS — BP 115/67 | HR 62 | Ht 67.5 in | Wt 223.4 lb

## 2012-07-06 DIAGNOSIS — I7090 Unspecified atherosclerosis: Secondary | ICD-10-CM

## 2012-07-06 DIAGNOSIS — I679 Cerebrovascular disease, unspecified: Secondary | ICD-10-CM

## 2012-07-06 DIAGNOSIS — I2510 Atherosclerotic heart disease of native coronary artery without angina pectoris: Secondary | ICD-10-CM

## 2012-07-06 DIAGNOSIS — E785 Hyperlipidemia, unspecified: Secondary | ICD-10-CM

## 2012-07-06 DIAGNOSIS — D649 Anemia, unspecified: Secondary | ICD-10-CM

## 2012-07-06 DIAGNOSIS — I10 Essential (primary) hypertension: Secondary | ICD-10-CM

## 2012-07-06 NOTE — Progress Notes (Signed)
Patient ID: [name], male   DOB: May 08, 1933, 79 y.o.   MRN: [id number]  HPI: Scheduled return visit for continued assessment and treatment of coronary artery disease and multiple cardiovascular risk factors. Patient had been diagnosed with COPD in the past, but was recently evaluated by Dr. Maple Hudson, who felt that he did not have significant chronic lung disease and did not require active care of a pulmonologist.   Patient also notes that a family member has been using illicit intravenous drugs. He seeks advice regarding appropriate evaluation and treatment.  Current Outpatient Prescriptions  Medication Sig Dispense Refill  . ALPRAZolam (XANAX) 1 MG tablet Take 1 tablet by mouth Three times daily as needed.      Marland Kitchen aspirin 81 MG EC tablet Take 81 mg by mouth daily.        . cloNIDine (CATAPRES) 0.1 MG tablet       . HYDROcodone-acetaminophen (VICODIN ES) 7.5-750 MG per tablet Take 1 tablet by mouth as needed.        Marland Kitchen levothyroxine (SYNTHROID, LEVOTHROID) 100 MCG tablet Take 100 mcg by mouth daily.        Marland Kitchen losartan-hydrochlorothiazide (HYZAAR) 100-12.5 MG per tablet Take 1 tablet by mouth daily.      . metoprolol tartrate (LOPRESSOR) 25 MG tablet Take 12.5 mg by mouth 2 (two) times daily.      . niacin 500 MG tablet Take 500 mg by mouth daily with breakfast.      . nitroGLYCERIN (NITROSTAT) 0.4 MG SL tablet Place 0.4 mg under the tongue every 5 (five) minutes as needed.        . pravastatin (PRAVACHOL) 40 MG tablet Take 40 mg by mouth at bedtime.        . TOLBUTamide (ORINASE) 500 MG tablet Take 1/2 tablet daily       No current facility-administered medications for this visit.   Allergies  Allergen Reactions  . Prednisone     Doesn't like to take high doses     Past medical history, social history, and family history reviewed and updated.  ROS: Denies chest pain, dyspnea, orthopnea or PND. All other systems reviewed and are negative.  PHYSICAL EXAM: BP 115/67  Pulse 62  Ht 5'  7.5" (1.715 m)  Wt 101.334 kg (223 lb 6.4 oz)  BMI 34.45 kg/m2;  Body mass index is 34.45 kg/(m^2). General-Well developed; no acute distress Body habitus-proportionate weight and height Neck-No JVD; no carotid bruits Lungs-minor inspiratory and expiratory rhonchi; resonant to percussion Cardiovascular-normal PMI; distant S1 and S2 Abdomen-normal bowel sounds; soft and non-tender without masses or organomegaly Musculoskeletal-No deformities, no cyanosis or clubbing Neurologic-Normal cranial nerves; symmetric strength and tone Skin-Warm, no significant lesions Extremities-distal pulses intact; trace edema  EKG: Normal sinus rhythm at a rate of 60 bpm; first-degree AV block; right bundle branch block; low-voltage; left anterior fascicular block; T-wave abnormality-consider inferior ischemia; no previous tracing for comparison.  [place] Bing, MD 07/06/2012  3:42 PM  ASSESSMENT AND PLAN

## 2012-07-06 NOTE — Progress Notes (Deleted)
Name: [name]    DOB: 08/19/1933  Age: 79 y.o.  MR#: [id number]       PCP:  Milana Obey, MD      Insurance: Payor: [employer] [place] [employer] / Plan: BLUE MEDICARE / Product Type: *No Product type* /   CC:    Chief Complaint  Patient presents with  . Coronary Artery Disease  . Hypertension   bottles VS Filed Vitals:   07/06/12 1357  BP: 115/67  Pulse: 62  Height: 5' 7.5" (1.715 m)  Weight: 223 lb 6.4 oz (101.334 kg)    Weights Current Weight  07/06/12 223 lb 6.4 oz (101.334 kg)  12/05/11 206 lb 9.6 oz (93.713 kg)  10/21/11 201 lb 12.8 oz (91.536 kg)    Blood Pressure  BP Readings from Last 3 Encounters:  07/06/12 115/67  12/05/11 122/70  10/21/11 130/60     Admit date:  (Not on file) Last encounter with RMR:  06/17/2012   Allergy Prednisone  Current Outpatient Prescriptions  Medication Sig Dispense Refill  . ALPRAZolam (XANAX) 1 MG tablet Take 1 tablet by mouth Three times daily as needed.      Marland Kitchen aspirin 81 MG EC tablet Take 81 mg by mouth daily.        . cloNIDine (CATAPRES) 0.1 MG tablet       . HYDROcodone-acetaminophen (VICODIN ES) 7.5-750 MG per tablet Take 1 tablet by mouth as needed.        Marland Kitchen levothyroxine (SYNTHROID, LEVOTHROID) 100 MCG tablet Take 100 mcg by mouth daily.        Marland Kitchen losartan-hydrochlorothiazide (HYZAAR) 100-12.5 MG per tablet Take 1 tablet by mouth daily.      . metoprolol tartrate (LOPRESSOR) 25 MG tablet Take 12.5 mg by mouth 2 (two) times daily.      . niacin 500 MG tablet Take 500 mg by mouth daily with breakfast.      . nitroGLYCERIN (NITROSTAT) 0.4 MG SL tablet Place 0.4 mg under the tongue every 5 (five) minutes as needed.        . pravastatin (PRAVACHOL) 40 MG tablet Take 40 mg by mouth at bedtime.        . TOLBUTamide (ORINASE) 500 MG tablet Take 1/2 tablet daily       No current facility-administered medications for this visit.    Discontinued Meds:    Medications Discontinued During This Encounter   Medication Reason  . lisinopril-hydrochlorothiazide (PRINZIDE,ZESTORETIC) 20-12.5 MG per tablet Patient Preference  . albuterol (PROVENTIL HFA) 108 (90 BASE) MCG/ACT inhaler Patient Preference    Patient Active Problem List   Diagnosis Date Noted  . Chronic kidney disease, stage 3 03/01/2012  . Peripheral vascular disease 06/12/2010  . ASCVD (arteriosclerotic cardiovascular disease)   . Cerebrovascular disease   . Diabetes mellitus   . Tobacco abuse   . Elevated PSA   . Alcohol abuse, in remission   . Anemia 05/22/2010  . Hypertension 05/13/2010  . HYPERLIPIDEMIA 01/09/2010  . CHRONIC OBSTRUCTIVE PULMONARY DISEASE 11/10/2008  . HYPOTHYROIDISM 07/18/2008    LABS    Component Value Date/Time   NA 132* 11/12/2011 0832   NA 122* 05/30/2010 1745   NA 122* 05/30/2010 1313   K 4.7 11/12/2011 0832   K 4.2 05/30/2010 1745   K 3.9 05/30/2010 1313   CL 95* 11/12/2011 0832   CL 89* 05/30/2010 1745   CL 87* 05/30/2010 1313   CO2 31 11/12/2011 0832   CO2 26 05/30/2010  1745   CO2 26 05/30/2010 1313   GLUCOSE 93 11/12/2011 0832   GLUCOSE 99 05/30/2010 1745   GLUCOSE 112* 05/30/2010 1313   BUN 22 11/12/2011 0832   BUN 15 05/30/2010 1745   BUN 17 05/30/2010 1313   CREATININE 1.5 11/12/2011 0832   CREATININE 1.13 05/30/2010 1745   CREATININE 1.27 05/30/2010 1313   CALCIUM 8.7 11/12/2011 0832   CALCIUM 9.3 05/30/2010 1745   CALCIUM 9.9 05/30/2010 1313   GFRNONAA >60 05/30/2010 1745   GFRNONAA 55* 05/30/2010 1313   GFRAA  Value: >60        The eGFR has been calculated using the MDRD equation. This calculation has not been validated in all clinical situations. eGFR's persistently <60 mL/min signify possible Chronic Kidney Disease. 05/30/2010 1745   GFRAA  Value: >60        The eGFR has been calculated using the MDRD equation. This calculation has not been validated in all clinical situations. eGFR's persistently <60 mL/min signify possible Chronic Kidney Disease. 05/30/2010 1313   CMP     Component  Value Date/Time   NA 132* 11/12/2011 0832   K 4.7 11/12/2011 0832   CL 95* 11/12/2011 0832   CO2 31 11/12/2011 0832   GLUCOSE 93 11/12/2011 0832   BUN 22 11/12/2011 0832   CREATININE 1.5 11/12/2011 0832   CALCIUM 8.7 11/12/2011 0832   PROT 7.8 05/30/2010 1313   ALBUMIN 3.9 05/30/2010 1313   AST 20 05/30/2010 1313   ALT 10 05/30/2010 1313   ALKPHOS 61 05/30/2010 1313   BILITOT 0.2* 05/30/2010 1313   GFRNONAA >60 05/30/2010 1745   GFRAA  Value: >60        The eGFR has been calculated using the MDRD equation. This calculation has not been validated in all clinical situations. eGFR's persistently <60 mL/min signify possible Chronic Kidney Disease. 05/30/2010 1745       Component Value Date/Time   WBC 6.0 05/30/2010 1313   WBC 9.3 12/05/2009 1300   HGB 11.1* 05/30/2010 1313   HGB 11.9 12/05/2009 1300   HCT 31.8* 05/30/2010 1313   HCT 37.7 12/05/2009 1300   MCV 89.6 05/30/2010 1313   MCV 97.9 12/05/2009 1300    Lipid Panel     Component Value Date/Time   CHOL 121 12/05/2009 1300   TRIG 68 12/05/2009 1300   HDL 36 12/05/2009 1300   LDLCALC 71 12/05/2009 1300    ABG No results found for this basename: phart, pco2, pco2art, po2, po2art, hco3, tco2, acidbasedef, o2sat     Lab Results  Component Value Date   TSH 1.259 12/05/2009   BNP (last 3 results) No results found for this basename: PROBNP,  in the last 8760 hours Cardiac Panel (last 3 results) No results found for this basename: CKTOTAL, CKMB, TROPONINI, RELINDX,  in the last 72 hours  Iron/TIBC/Ferritin No results found for this basename: iron, tibc, ferritin     EKG Orders placed in visit on 07/06/12  . EKG 12-LEAD     Prior Assessment and Plan Problem List as of 07/06/2012   HYPOTHYROIDISM   CHRONIC OBSTRUCTIVE PULMONARY DISEASE   Last Assessment & Plan   12/05/2011 Office Visit Written 12/20/2011  9:29 PM by Waymon Budge, MD     Primarily emphysema with little potential response to bronchodilators. Aggravating  comorbidities would include his atherosclerotic disease and esophageal dysphagia which is probably associated with some reflux.    HYPERLIPIDEMIA   Last Assessment & Plan   06/16/2011  Office Visit Written 06/16/2011  1:01 PM by Kathlen Brunswick, MD     Lipid profile 6 months ago was excellent; current medication will be continued.    Hypertension   Last Assessment & Plan   06/16/2011 Office Visit Edited 06/18/2011 10:49 PM by Kathlen Brunswick, MD     Patient monitors blood pressures at home and reports systolics typically range from 95-130 while diastolics are never elevated.  Current therapy will be continued.    Anemia   Last Assessment & Plan   06/12/2010 Office Visit Written 06/12/2010 12:36 PM by Kathlen Brunswick, MD     Anemia assessment and treatment will be managed by Dr. Sherwood Gambler    ASCVD (arteriosclerotic cardiovascular disease)   Last Assessment & Plan   06/16/2011 Office Visit Written 06/16/2011  1:00 PM by Kathlen Brunswick, MD     No symptoms to suggest myocardial ischemia or CHF.  Our approach will be optimal control of cardiovascular risk factors.    Cerebrovascular disease   Last Assessment & Plan   06/16/2011 Office Visit Written 06/16/2011  1:00 PM by Kathlen Brunswick, MD     Repeat carotid ultrasound in 2012 indicated increased plaque burden, but no focal stenosis.  Study will be repeated in one year.    Diabetes mellitus   Tobacco abuse   Last Assessment & Plan   10/21/2011 Office Visit Written 10/27/2011  9:31 PM by Waymon Budge, MD     Strongly encouraged to remain completely off of cigarettes    Elevated PSA   Alcohol abuse, in remission   Peripheral vascular disease   Chronic kidney disease, stage 3       Imaging: US Carotid Duplex Bilateral  06/14/2012   *RADIOLOGY REPORT*  Clinical Data: Coronary artery disease  BILATERAL CAROTID DUPLEX ULTRASOUND  Technique: Wallace Cullens scale imaging, color Doppler and duplex ultrasound was performed of bilateral carotid and vertebral  arteries in the neck.  Comparison:  09/30/2011  Criteria:  Quantification of carotid stenosis is based on velocity parameters that correlate the residual internal carotid diameter with NASCET-based stenosis levels, using the diameter of the distal internal carotid lumen as the denominator for stenosis measurement.  The following velocity measurements were obtained:                   PEAK SYSTOLIC/END DIASTOLIC RIGHT ICA:                        102/6cm/sec CCA:                        103/11cm/sec SYSTOLIC ICA/CCA RATIO:     0.99 DIASTOLIC ICA/CCA RATIO:    0.59 ECA:                        01/1967cm/sec  LEFT ICA:                        212/26cm/sec CCA:                        103/12cm/sec SYSTOLIC ICA/CCA RATIO:     1.86 DIASTOLIC ICA/CCA RATIO:    2.05 ECA:                        159cm/sec  Findings:  RIGHT CAROTID ARTERY: The gray scale images demonstrate mild  plaque in the distal common carotid artery extending into the proximal internal carotid artery.  Evaluation of the waveforms, velocities and flow velocity ratios however demonstrate no evidence of focal hemodynamically significant stenosis.  RIGHT VERTEBRAL ARTERY:  Antegrade  LEFT CAROTID ARTERY: There has been increase in the peak systolic velocity in the left internal carotid artery now measuring 212 cm/sec.  It previously was measured at 139 cm/sec.  This is consistent with stenosis in the 50-69% range in the upper end of that spectrum.  The gray scale images show evidence of ulcerations in the region of the carotid bulb.  LEFT VERTEBRAL ARTERY:  Antegrade  IMPRESSION: Less than 50% stenosis on the right.  Increase in velocities on the left consistent with a 50-69% degree of stenosis in the upper end of that spectrum. Changes suggestive of ulcerations are also noted in the proximal left internal carotid artery.   Original Report Authenticated By: Alcide Clever, M.D.

## 2012-07-06 NOTE — Assessment & Plan Note (Signed)
No symptoms to suggest progression of coronary disease or congestive heart failure. Optimal management of cardiovascular risk factors will be continued.

## 2012-07-06 NOTE — Assessment & Plan Note (Signed)
Stable mild to moderate renal dysfunction.

## 2012-07-06 NOTE — Assessment & Plan Note (Addendum)
Mild anemia persists. Not likely contributing to his symptoms.  Likely related to mild to moderate chronic kidney disease.

## 2012-07-06 NOTE — Assessment & Plan Note (Signed)
Most recent lipid profile 5 months ago was excellent except for some elevation of triglycerides and low HDL. Current management strategy will be continued.

## 2012-07-06 NOTE — Assessment & Plan Note (Signed)
BP well controlled over the past year. No modification of current medication appears necessary.

## 2012-07-06 NOTE — Patient Instructions (Addendum)
Your physician recommends that you schedule a follow-up appointment in: ONE YEAR 

## 2012-07-06 NOTE — Assessment & Plan Note (Signed)
Carotid ultrasound a few months ago revealed progression of disease with the worst focal lesion being a left ICA 50-69% stenosis. A repeat imaging study is anticipated within the next 9 months.

## 2012-07-08 ENCOUNTER — Encounter: Payer: Self-pay | Admitting: Cardiology

## 2012-09-08 ENCOUNTER — Emergency Department (HOSPITAL_COMMUNITY): Payer: Medicare Other

## 2012-09-08 ENCOUNTER — Emergency Department (HOSPITAL_COMMUNITY)
Admission: EM | Admit: 2012-09-08 | Discharge: 2012-09-08 | Disposition: A | Discharge status: Home / Self Care | Payer: Medicare Other | Attending: Emergency Medicine | Admitting: Emergency Medicine

## 2012-09-08 ENCOUNTER — Encounter (HOSPITAL_COMMUNITY): Payer: Self-pay | Admitting: Emergency Medicine

## 2012-09-08 DIAGNOSIS — G479 Sleep disorder, unspecified: Secondary | ICD-10-CM | POA: Insufficient documentation

## 2012-09-08 DIAGNOSIS — E860 Dehydration: Secondary | ICD-10-CM

## 2012-09-08 DIAGNOSIS — E039 Hypothyroidism, unspecified: Secondary | ICD-10-CM | POA: Insufficient documentation

## 2012-09-08 DIAGNOSIS — E119 Type 2 diabetes mellitus without complications: Secondary | ICD-10-CM | POA: Insufficient documentation

## 2012-09-08 DIAGNOSIS — Z8679 Personal history of other diseases of the circulatory system: Secondary | ICD-10-CM | POA: Insufficient documentation

## 2012-09-08 DIAGNOSIS — Z87891 Personal history of nicotine dependence: Secondary | ICD-10-CM | POA: Insufficient documentation

## 2012-09-08 DIAGNOSIS — E785 Hyperlipidemia, unspecified: Secondary | ICD-10-CM | POA: Insufficient documentation

## 2012-09-08 DIAGNOSIS — I252 Old myocardial infarction: Secondary | ICD-10-CM | POA: Insufficient documentation

## 2012-09-08 DIAGNOSIS — M7989 Other specified soft tissue disorders: Secondary | ICD-10-CM | POA: Insufficient documentation

## 2012-09-08 DIAGNOSIS — J441 Chronic obstructive pulmonary disease with (acute) exacerbation: Secondary | ICD-10-CM | POA: Insufficient documentation

## 2012-09-08 DIAGNOSIS — Z7982 Long term (current) use of aspirin: Secondary | ICD-10-CM | POA: Insufficient documentation

## 2012-09-08 DIAGNOSIS — Z79899 Other long term (current) drug therapy: Secondary | ICD-10-CM | POA: Insufficient documentation

## 2012-09-08 DIAGNOSIS — I10 Essential (primary) hypertension: Secondary | ICD-10-CM | POA: Insufficient documentation

## 2012-09-08 DIAGNOSIS — R21 Rash and other nonspecific skin eruption: Secondary | ICD-10-CM | POA: Insufficient documentation

## 2012-09-08 DIAGNOSIS — Z8659 Personal history of other mental and behavioral disorders: Secondary | ICD-10-CM | POA: Insufficient documentation

## 2012-09-08 DIAGNOSIS — N289 Disorder of kidney and ureter, unspecified: Secondary | ICD-10-CM

## 2012-09-08 DIAGNOSIS — Z862 Personal history of diseases of the blood and blood-forming organs and certain disorders involving the immune mechanism: Secondary | ICD-10-CM | POA: Insufficient documentation

## 2012-09-08 LAB — COMPREHENSIVE METABOLIC PANEL
ALT: 15 U/L (ref 0–53)
AST: 20 U/L (ref 0–37)
Alkaline Phosphatase: 64 U/L (ref 39–117)
CO2: 28 mEq/L (ref 19–32)
GFR calc Af Amer: 36 mL/min — ABNORMAL LOW (ref >90)
Glucose, Bld: 109 mg/dL — ABNORMAL HIGH (ref 70–99)
Potassium: 4.4 mEq/L (ref 3.5–5.1)
Sodium: 137 mEq/L (ref 135–145)
Total Protein: 7.9 g/dL (ref 6.0–8.3)

## 2012-09-08 LAB — CBC WITH DIFFERENTIAL/PLATELET
Basophils Absolute: 0 10*3/uL (ref 0.0–0.1)
Eosinophils Absolute: 0.4 10*3/uL (ref 0.0–0.7)
Lymphocytes Relative: 34 % (ref 12–46)
Lymphs Abs: 2.1 10*3/uL (ref 0.7–4.0)
Neutrophils Relative %: 52 % (ref 43–77)
Platelets: 233 10*3/uL (ref 150–400)
RBC: 4.34 MIL/uL (ref 4.22–5.81)
WBC: 6.4 10*3/uL (ref 4.0–10.5)

## 2012-09-08 LAB — URINALYSIS, ROUTINE W REFLEX MICROSCOPIC
Bilirubin Urine: NEGATIVE
Ketones, ur: NEGATIVE mg/dL
Nitrite: NEGATIVE
pH: 5 (ref 5.0–8.0)

## 2012-09-08 LAB — URINE MICROSCOPIC-ADD ON

## 2012-09-08 MED ORDER — SODIUM CHLORIDE 0.9 % IV BOLUS (SEPSIS)
500.0000 mL | Freq: Once | INTRAVENOUS | Status: AC
  Administered 2012-09-08: 500 mL via INTRAVENOUS

## 2012-09-08 NOTE — ED Notes (Signed)
The patient has been ambulatory in the department, difficult to direct at time.  The family states that he is very impatient and threatening to put his clothing back on.  States "I am not staying in the hospital."

## 2012-09-08 NOTE — ED Provider Notes (Signed)
CSN: [id number]     Arrival date & time 09/08/12  1352 History   This chart was scribed for [employer]. Rubin Payor, MD by Dorothey Baseman, ED Scribe. This patient was seen in room APA06/[facility]06 and the patient's care was started at 3:15 PM.  First MD Initiated Contact with Patient 09/08/12 1513     Chief Complaint  Patient presents with  . Leg Pain   The history is provided by the patient and a relative (daughter). No language interpreter was used.   HPI Comments: [name] is a 79 y.o. male who presents to the Emergency Department complaining of right leg swelling as reported by his daughter. Patient reports the leg swelling for the past few years, but he denies any recent changes. He reports sleeping more. He reports a mild rash on his back. Patient denies chest pain, trouble breathing, and  fever. He denies history of DVT. He has a history of MI in 2000, DM, and HTN. He reports he is a former drinker, cessation 14 years ago with no associated liver issues. He reports he is a former smoker, cessation 1.5 years ago.   PCP- Dr. John Giovanni  Past Medical History  Diagnosis Date  . Hyperlipidemia   . Hypertension   . Hypothyroid   . COPD (chronic obstructive pulmonary disease)   . Anemia   . ASCVD (arteriosclerotic cardiovascular disease)      Inferior myocardial infarction in 08/1998 requiring PCI of the RCA; moderate residual disease in the left anterior descending and first diagonal; ejection fraction of 45%  . Cerebrovascular disease      Right carotid bruit; plaque without stenosis in 2003 and 2005  . Diabetes mellitus     A1c-7.7 in 2004 with diet-controlled; 6.3 and 11/08 on oral medication  . Tobacco abuse     Consumption tapered to 2 packs per week  . Elevated PSA     prior negative prostate biopsy  . Alcohol abuse, in remission    Past Surgical History  Procedure Laterality Date  . Nasal sinus surgery    . Ulnar nerve repair      right arm  . Dental surgery      Right  jaw; continued chronic pain  . Colonoscopy  06/2010    Dr. Karilyn Cota   Family History  Problem Relation Age of Onset  . Hypertension Mother   . Heart disease Father   . Hyperlipidemia Father   . Hypertension Father   . Diabetes Father    History  Substance Use Topics  . Smoking status: Former Smoker -- 1.00 packs/day for 60 years    Types: Cigarettes    Quit date: 04/21/2011  . Smokeless tobacco: Never Used  . Alcohol Use: No     Comment: Former Abuse    Review of Systems  Respiratory: Negative for shortness of breath.   Cardiovascular: Negative for chest pain.  Psychiatric/Behavioral: Positive for sleep disturbance.  All other systems reviewed and are negative.    Allergies  Prednisone  Home Medications   Current Outpatient Rx  Name  Route  Sig  Dispense  Refill  . alprazolam (XANAX) 2 MG tablet   Oral   Take 2 mg by mouth 3 (three) times daily as needed for sleep or anxiety.         Marland Kitchen aspirin 81 MG EC tablet   Oral   Take 81 mg by mouth daily.           . cloNIDine (CATAPRES)  0.1 MG tablet   Oral   Take 0.1 mg by mouth 2 (two) times daily.          Marland Kitchen HYDROcodone-acetaminophen (NORCO) 7.5-325 MG per tablet   Oral   Take 1 tablet by mouth every 6 (six) hours as needed for pain.         Marland Kitchen levothyroxine (SYNTHROID, LEVOTHROID) 100 MCG tablet   Oral   Take 100 mcg by mouth daily.           . metoprolol tartrate (LOPRESSOR) 25 MG tablet   Oral   Take 25 mg by mouth every morning.          . pravastatin (PRAVACHOL) 40 MG tablet   Oral   Take 40 mg by mouth at bedtime.           . TOLBUTamide (ORINASE) 500 MG tablet   Oral   Take 500 mg by mouth 2 (two) times daily. Take 1/2 tablet daily         . nitroGLYCERIN (NITROSTAT) 0.4 MG SL tablet   Sublingual   Place 0.4 mg under the tongue every 5 (five) minutes as needed.            Triage Vitals: BP 137/53  Pulse 63  Temp(Src) 98 F (36.7 C) (Oral)  Resp 20  Ht 5\' 8"  (1.727 m)  Wt  208 lb (94.348 kg)  BMI 31.63 kg/m2  SpO2 92%  Physical Exam  Nursing note and vitals reviewed. Constitutional: He is oriented to person, place, and time. He appears well-developed and well-nourished. No distress.  HENT:  Head: Normocephalic and atraumatic.  Bulbous nose associated with prior alcohol use.  Eyes: Conjunctivae are normal.  Neck: Normal range of motion. Neck supple.  Cardiovascular: Normal rate and normal heart sounds.   Pulmonary/Chest: Effort normal. He has wheezes.  Mild wheezing.   Abdominal: There is no tenderness.  Musculoskeletal: Normal range of motion. He exhibits edema.  Mild pitting edema to the right leg.  Neurological: He is alert and oriented to person, place, and time.  Skin: Skin is warm and dry. Rash noted.  Telangiectasia to the back.  Psychiatric: He has a normal mood and affect. His behavior is normal.    ED Course  Procedures (including critical care time)   DIAGNOSTIC STUDIES: COORDINATION OF CARE: 3:22PM- Discussed concerns of a possible embolism. Discussed plan with patient at bedside and patient verbalized agreement.  Results for orders placed during the hospital encounter of 09/08/12  CBC WITH DIFFERENTIAL      Result Value Range   WBC 6.4  4.0 - 10.5 K/uL   RBC 4.34  4.22 - 5.81 MIL/uL   Hemoglobin 13.0  13.0 - 17.0 g/dL   HCT 47.8  29.5 - 62.1 %   MCV 90.6  78.0 - 100.0 fL   MCH 30.0  26.0 - 34.0 pg   MCHC 33.1  30.0 - 36.0 g/dL   RDW 30.8  65.7 - 84.6 %   Platelets 233  150 - 400 K/uL   Neutrophils Relative % 52  43 - 77 %   Neutro Abs 3.3  1.7 - 7.7 K/uL   Lymphocytes Relative 34  12 - 46 %   Lymphs Abs 2.1  0.7 - 4.0 K/uL   Monocytes Relative 9  3 - 12 %   Monocytes Absolute 0.6  0.1 - 1.0 K/uL   Eosinophils Relative 6 (*) 0 - 5 %   Eosinophils Absolute 0.4  0.0 -  0.7 K/uL   Basophils Relative 1  0 - 1 %   Basophils Absolute 0.0  0.0 - 0.1 K/uL  COMPREHENSIVE METABOLIC PANEL      Result Value Range   Sodium 137  135  - 145 mEq/L   Potassium 4.4  3.5 - 5.1 mEq/L   Chloride 100  96 - 112 mEq/L   CO2 28  19 - 32 mEq/L   Glucose, Bld 109 (*) 70 - 99 mg/dL   BUN 57 (*) 6 - 23 mg/dL   Creatinine, Ser 7.82 (*) 0.50 - 1.35 mg/dL   Calcium 9.3  8.4 - 95.6 mg/dL   Total Protein 7.9  6.0 - 8.3 g/dL   Albumin 3.4 (*) 3.5 - 5.2 g/dL   AST 20  0 - 37 U/L   ALT 15  0 - 53 U/L   Alkaline Phosphatase 64  39 - 117 U/L   Total Bilirubin 0.3  0.3 - 1.2 mg/dL   GFR calc non Af Amer 31 (*) >90 mL/min   GFR calc Af Amer 36 (*) >90 mL/min  URINALYSIS, ROUTINE W REFLEX MICROSCOPIC      Result Value Range   Color, Urine YELLOW  YELLOW   APPearance CLEAR  CLEAR   Specific Gravity, Urine 1.020  1.005 - 1.030   pH 5.0  5.0 - 8.0   Glucose, UA NEGATIVE  NEGATIVE mg/dL   Hgb urine dipstick TRACE (*) NEGATIVE   Bilirubin Urine NEGATIVE  NEGATIVE   Ketones, ur NEGATIVE  NEGATIVE mg/dL   Protein, ur NEGATIVE  NEGATIVE mg/dL   Urobilinogen, UA 0.2  0.0 - 1.0 mg/dL   Nitrite NEGATIVE  NEGATIVE   Leukocytes, UA NEGATIVE  NEGATIVE  URINE MICROSCOPIC-ADD ON      Result Value Range   Squamous Epithelial / LPF RARE  RARE   WBC, UA 0-2  <3 WBC/hpf   RBC / HPF 0-2  <3 RBC/hpf   Bacteria, UA RARE  RARE   Crystals URIC ACID CRYSTALS (*) NEGATIVE   No results found.  Labs Review  Labs Reviewed  CBC WITH DIFFERENTIAL - Abnormal; Notable for the following:    Eosinophils Relative 6 (*)    All other components within normal limits  COMPREHENSIVE METABOLIC PANEL - Abnormal; Notable for the following:    Glucose, Bld 109 (*)    BUN 57 (*)    Creatinine, Ser 1.99 (*)    Albumin 3.4 (*)    GFR calc non Af Amer 31 (*)    GFR calc Af Amer 36 (*)    All other components within normal limits  URINALYSIS, ROUTINE W REFLEX MICROSCOPIC - Abnormal; Notable for the following:    Hgb urine dipstick TRACE (*)    All other components within normal limits  URINE MICROSCOPIC-ADD ON - Abnormal; Notable for the following:    Crystals URIC  ACID CRYSTALS (*)    All other components within normal limits   Imaging Review  Dg Chest 2 View  09/08/2012   *RADIOLOGY REPORT*  Clinical Data: Altered mental status.  CHEST - 2 VIEW  Comparison: Chest x-ray 04/16/2011.  Findings: Lungs appear hyperexpanded with flattening of the hemidiaphragms, increased retrosternal air space and pruning of the pulmonary vasculature in the periphery, suggestive of underlying COPD.  No acute consolidative airspace disease.  No pleural effusions.  No evidence of pulmonary edema.  Mild elevation of the right hemidiaphragm is unchanged and appears be chronic.  Heart size is within normal limits.  Upper mediastinal contours are unremarkable.  Atherosclerosis in the thoracic aorta.  IMPRESSION: 1.  Chronic changes of COPD redemonstrated, as above, without evidence to suggest acute cardiopulmonary disease. 2.  Atherosclerosis.   Original Report Authenticated By: Trudie Reed, M.D.   Ct Head Wo Contrast  09/08/2012   *RADIOLOGY REPORT*  Clinical Data: Right leg swelling and confusion.  CT HEAD WITHOUT CONTRAST  Technique:  Contiguous axial images were obtained from the base of the skull through the vertex without contrast.  Comparison: None.  Findings: No evidence of acute infarct, acute hemorrhage, mass lesion, mass effect or hydrocephalus.  Mild atrophy and periventricular low attenuation. Remote lacunar infarct should or dilated perivascular space in the left basal ganglia.  Minimal mucosal thickening in the paranasal sinuses.  No air fluid levels. Mastoid air cells are clear.  IMPRESSION:  1.  No acute intracranial abnormality. 2.  Atrophy and chronic microvascular white matter ischemic changes.   Original Report Authenticated By: Leanna Battles, M.D.    MDM   1. Dehydration   2. Renal insufficiency     Patient was brought in for mental status changes. Lab work and imaging reassuring. Creatinine has increased. Dehydration may be a component of slight mental  status. The swelling the leg but negative Doppler. Will have follow with PCP. Patient was not willing to stay for further workup and was given a IV fluid bolus.  I personally performed the services described in this documentation, which was scribed in my presence. The recorded information has been reviewed and is accurate.     Juliet Rude. Rubin Payor, MD 09/09/12 [facility]

## 2012-09-08 NOTE — ED Notes (Addendum)
Pt sent by Doctor. Daughter reports, right leg swelling and spots on back. Pt has h/o dementia.

## 2012-09-08 NOTE — ED Notes (Addendum)
Upon entry to exam room, the patient is talking on the phone, family at bedside.  Questioned the patient about his leg pain with no response.  Asked the patient to get undressed in order to examine the patient who states "hell no i ain't getting undressed, hell no."  Family at bedside who states that the patients right leg is swollen.

## 2012-11-18 ENCOUNTER — Other Ambulatory Visit: Payer: Self-pay

## 2013-02-12 ENCOUNTER — Encounter (HOSPITAL_COMMUNITY): Payer: Self-pay | Admitting: Emergency Medicine

## 2013-02-12 ENCOUNTER — Emergency Department (HOSPITAL_COMMUNITY): Payer: Medicare Other

## 2013-02-12 ENCOUNTER — Inpatient Hospital Stay (HOSPITAL_COMMUNITY)
Admission: EM | Admit: 2013-02-12 | Discharge: 2013-02-14 | DRG: 192 | Disposition: A | Discharge status: Home Health | Payer: Medicare Other | Attending: Family Medicine | Admitting: Family Medicine

## 2013-02-12 DIAGNOSIS — R972 Elevated prostate specific antigen [PSA]: Secondary | ICD-10-CM

## 2013-02-12 DIAGNOSIS — I679 Cerebrovascular disease, unspecified: Secondary | ICD-10-CM | POA: Diagnosis present

## 2013-02-12 DIAGNOSIS — I2510 Atherosclerotic heart disease of native coronary artery without angina pectoris: Secondary | ICD-10-CM

## 2013-02-12 DIAGNOSIS — E119 Type 2 diabetes mellitus without complications: Secondary | ICD-10-CM

## 2013-02-12 DIAGNOSIS — Z8249 Family history of ischemic heart disease and other diseases of the circulatory system: Secondary | ICD-10-CM

## 2013-02-12 DIAGNOSIS — I10 Essential (primary) hypertension: Secondary | ICD-10-CM

## 2013-02-12 DIAGNOSIS — Z87891 Personal history of nicotine dependence: Secondary | ICD-10-CM

## 2013-02-12 DIAGNOSIS — Z833 Family history of diabetes mellitus: Secondary | ICD-10-CM

## 2013-02-12 DIAGNOSIS — I739 Peripheral vascular disease, unspecified: Secondary | ICD-10-CM

## 2013-02-12 DIAGNOSIS — J441 Chronic obstructive pulmonary disease with (acute) exacerbation: Principal | ICD-10-CM | POA: Diagnosis present

## 2013-02-12 DIAGNOSIS — N189 Chronic kidney disease, unspecified: Secondary | ICD-10-CM | POA: Diagnosis present

## 2013-02-12 DIAGNOSIS — Z720 Tobacco use: Secondary | ICD-10-CM

## 2013-02-12 DIAGNOSIS — I129 Hypertensive chronic kidney disease with stage 1 through stage 4 chronic kidney disease, or unspecified chronic kidney disease: Secondary | ICD-10-CM | POA: Diagnosis present

## 2013-02-12 DIAGNOSIS — N183 Chronic kidney disease, stage 3 unspecified: Secondary | ICD-10-CM

## 2013-02-12 DIAGNOSIS — F1011 Alcohol abuse, in remission: Secondary | ICD-10-CM

## 2013-02-12 DIAGNOSIS — Z9981 Dependence on supplemental oxygen: Secondary | ICD-10-CM

## 2013-02-12 DIAGNOSIS — R0902 Hypoxemia: Secondary | ICD-10-CM | POA: Diagnosis present

## 2013-02-12 DIAGNOSIS — Z9181 History of falling: Secondary | ICD-10-CM

## 2013-02-12 DIAGNOSIS — E785 Hyperlipidemia, unspecified: Secondary | ICD-10-CM

## 2013-02-12 DIAGNOSIS — D649 Anemia, unspecified: Secondary | ICD-10-CM

## 2013-02-12 DIAGNOSIS — Z9861 Coronary angioplasty status: Secondary | ICD-10-CM

## 2013-02-12 DIAGNOSIS — J449 Chronic obstructive pulmonary disease, unspecified: Secondary | ICD-10-CM

## 2013-02-12 DIAGNOSIS — J45901 Unspecified asthma with (acute) exacerbation: Principal | ICD-10-CM

## 2013-02-12 DIAGNOSIS — E039 Hypothyroidism, unspecified: Secondary | ICD-10-CM | POA: Diagnosis present

## 2013-02-12 HISTORY — DX: Old myocardial infarction: I25.2

## 2013-02-12 LAB — CBC WITH DIFFERENTIAL/PLATELET
Basophils Absolute: 0.1 10*3/uL (ref 0.0–0.1)
Basophils Relative: 1 % (ref 0–1)
EOS ABS: 0.4 10*3/uL (ref 0.0–0.7)
EOS PCT: 4 % (ref 0–5)
HEMATOCRIT: 39.6 % (ref 39.0–52.0)
HEMOGLOBIN: 12.8 g/dL — AB (ref 13.0–17.0)
LYMPHS ABS: 1.2 10*3/uL (ref 0.7–4.0)
Lymphocytes Relative: 13 % (ref 12–46)
MCH: 29.6 pg (ref 26.0–34.0)
MCHC: 32.3 g/dL (ref 30.0–36.0)
MCV: 91.5 fL (ref 78.0–100.0)
MONOS PCT: 8 % (ref 3–12)
Monocytes Absolute: 0.7 10*3/uL (ref 0.1–1.0)
Neutro Abs: 7 10*3/uL (ref 1.7–7.7)
Neutrophils Relative %: 74 % (ref 43–77)
Platelets: 249 10*3/uL (ref 150–400)
RBC: 4.33 MIL/uL (ref 4.22–5.81)
RDW: 14.6 % (ref 11.5–15.5)
WBC: 9.5 10*3/uL (ref 4.0–10.5)

## 2013-02-12 LAB — URINALYSIS, ROUTINE W REFLEX MICROSCOPIC
BILIRUBIN URINE: NEGATIVE
Glucose, UA: NEGATIVE mg/dL
Ketones, ur: NEGATIVE mg/dL
Leukocytes, UA: NEGATIVE
Nitrite: NEGATIVE
PH: 5.5 (ref 5.0–8.0)
Protein, ur: NEGATIVE mg/dL
SPECIFIC GRAVITY, URINE: 1.015 (ref 1.005–1.030)
Urobilinogen, UA: 0.2 mg/dL (ref 0.0–1.0)

## 2013-02-12 LAB — COMPREHENSIVE METABOLIC PANEL
ALK PHOS: 112 U/L (ref 39–117)
ALT: 48 U/L (ref 0–53)
AST: 55 U/L — ABNORMAL HIGH (ref 0–37)
Albumin: 3.5 g/dL (ref 3.5–5.2)
BUN: 46 mg/dL — AB (ref 6–23)
CO2: 33 mEq/L — ABNORMAL HIGH (ref 19–32)
Calcium: 8.7 mg/dL (ref 8.4–10.5)
Chloride: 90 mEq/L — ABNORMAL LOW (ref 96–112)
Creatinine, Ser: 2.36 mg/dL — ABNORMAL HIGH (ref 0.50–1.35)
GFR calc non Af Amer: 25 mL/min — ABNORMAL LOW (ref >90)
GFR, EST AFRICAN AMERICAN: 29 mL/min — AB (ref >90)
GLUCOSE: 184 mg/dL — AB (ref 70–99)
POTASSIUM: 4.4 meq/L (ref 3.7–5.3)
Sodium: 133 mEq/L — ABNORMAL LOW (ref 137–147)
TOTAL PROTEIN: 7.9 g/dL (ref 6.0–8.3)
Total Bilirubin: 0.4 mg/dL (ref 0.3–1.2)

## 2013-02-12 LAB — URINE MICROSCOPIC-ADD ON

## 2013-02-12 LAB — PRO B NATRIURETIC PEPTIDE: PRO B NATRI PEPTIDE: 330.7 pg/mL (ref 0–450)

## 2013-02-12 MED ORDER — ALBUTEROL SULFATE (2.5 MG/3ML) 0.083% IN NEBU
5.0000 mg | INHALATION_SOLUTION | Freq: Once | RESPIRATORY_TRACT | Status: AC
  Administered 2013-02-12: 5 mg via RESPIRATORY_TRACT
  Filled 2013-02-12: qty 6

## 2013-02-12 MED ORDER — LEVOFLOXACIN IN D5W 500 MG/100ML IV SOLN
500.0000 mg | Freq: Once | INTRAVENOUS | Status: AC
  Administered 2013-02-12: 500 mg via INTRAVENOUS
  Filled 2013-02-12: qty 100

## 2013-02-12 MED ORDER — METHYLPREDNISOLONE SODIUM SUCC 125 MG IJ SOLR
125.0000 mg | Freq: Once | INTRAMUSCULAR | Status: AC
  Administered 2013-02-12: 125 mg via INTRAVENOUS
  Filled 2013-02-12: qty 2

## 2013-02-12 MED ORDER — IPRATROPIUM BROMIDE 0.02 % IN SOLN
0.5000 mg | Freq: Once | RESPIRATORY_TRACT | Status: AC
  Administered 2013-02-12: 0.5 mg via RESPIRATORY_TRACT
  Filled 2013-02-12: qty 2.5

## 2013-02-12 NOTE — ED Notes (Signed)
Report called to Vsuvsanna, RN on unit 300.

## 2013-02-12 NOTE — ED Notes (Signed)
Pt presents to er for further evaluation after fall that occurred yesterday, pt states that he was getting up to walk to the tv, lost his balance and fell, hitting his head against the tv and the tv stand, daughter is concerned that pt's health has not been the same for the past week or so, pt leaning to the left in the wheelchair, daughter states that he has been leaning to the left since the fall yesterday, has been very forgetful that has increased over the past 6 months, pt will deny any pain when asked but when asked by his daughter about back pain pt will stay that his back is hurting,

## 2013-02-12 NOTE — ED Provider Notes (Signed)
CSN: [id number]     Arrival date & time 02/12/13  1824 History   This chart was scribed for Johnathan Norrie, MD by Eston Mould, ED Scribe. This patient was seen in room [facility] and the patient's care was started at 7:01 PM.   Chief Complaint  Patient presents with  . Fall   Patient is a 80 y.o. male presenting with fall. The history is provided by the patient and a relative. No language interpreter was used.  Fall This is a new problem. The current episode started 2 days ago. The problem occurs rarely. The problem has not changed since onset.Nothing aggravates the symptoms. Nothing relieves the symptoms. He has tried nothing for the symptoms. The treatment provided no relief.   HPI Comments: [name] is a 80 y.o. male with a hx of asthma and COPD who presents to the Emergency Department complaining of fall  2 days ago. Pt states he was in bed, walked to the living room, and was using a urinal and lost his balance  and fell and hit his head a TV and TV stand and knocked over a lamp on a  table. He states he hit the right side of his head. He states he has been having dry mouth lately. Pt states he has been using OTC bioteen and states he has relief. Pt denies hx of falls. Pt states he lives at home alone. Pt denies being on oxygen. Pt denies smoking but states he has a hx of smoking. He stopped smoking 2-3 years ago. Pt denies having pain. Pt denies LOC and SOB. Pt denies nausea, vomiting, or diarrhea. He denies being short of breath however he obviously appears to kidney. He denies any cough or fever. He states he used to have a relates her an albuterol inhaler in the past. Family reports he has been having some memory and forgetfulness.   PCP Dr Karie Kirks  Past Medical History  Diagnosis Date  . Hyperlipidemia   . Hypertension   . Hypothyroid   . COPD (chronic obstructive pulmonary disease)   . Anemia   . ASCVD (arteriosclerotic cardiovascular disease)      Inferior  myocardial infarction in 08/1998 requiring PCI of the RCA; moderate residual disease in the left anterior descending and first diagonal; ejection fraction of 45%  . Cerebrovascular disease      Right carotid bruit; plaque without stenosis in 2003 and 2005  . Diabetes mellitus     A1c-7.7 in 2004 with diet-controlled; 6.3 and 11/08 on oral medication  . Tobacco abuse     Consumption tapered to 2 packs per week  . Elevated PSA     prior negative prostate biopsy  . Alcohol abuse, in remission   . MI, old    Past Surgical History  Procedure Laterality Date  . Nasal sinus surgery    . Ulnar nerve repair      right arm  . Dental surgery      Right jaw; continued chronic pain  . Colonoscopy  06/2010    Dr. Laural Golden  . Stents      cardiac stent   Family History  Problem Relation Age of Onset  . Hypertension Mother   . Heart disease Father   . Hyperlipidemia Father   . Hypertension Father   . Diabetes Father    History  Substance Use Topics  . Smoking status: Former Smoker -- 1.00 packs/day for 60 years    Types: Cigarettes    Quit  date: 04/21/2011  . Smokeless tobacco: Never Used  . Alcohol Use: No     Comment: Former Abuse   lives at home Lives alone  Review of Systems  All other systems reviewed and are negative.    Allergies  Prednisone  Home Medications   Current Outpatient Rx  Name  Route  Sig  Dispense  Refill  . alprazolam (XANAX) 2 MG tablet   Oral   Take 2 mg by mouth 3 (three) times daily as needed for sleep or anxiety.         Marland Kitchen aspirin 81 MG EC tablet   Oral   Take 81 mg by mouth daily.           . cloNIDine (CATAPRES) 0.1 MG tablet   Oral   Take 0.1 mg by mouth 2 (two) times daily.          Marland Kitchen HYDROcodone-acetaminophen (NORCO) 7.5-325 MG per tablet   Oral   Take 1 tablet by mouth every 6 (six) hours as needed for pain.         Marland Kitchen levothyroxine (SYNTHROID, LEVOTHROID) 100 MCG tablet   Oral   Take 100 mcg by mouth daily.           Marland Kitchen  losartan-hydrochlorothiazide (HYZAAR) 100-12.5 MG per tablet   Oral   Take 1 tablet by mouth daily.         . metFORMIN (GLUCOPHAGE-XR) 500 MG 24 hr tablet   Oral   Take 500 mg by mouth daily.         . metoprolol tartrate (LOPRESSOR) 25 MG tablet   Oral   Take 25 mg by mouth 2 (two) times daily.          . pravastatin (PRAVACHOL) 40 MG tablet   Oral   Take 40 mg by mouth at bedtime.           . TOLBUTamide (ORINASE) 500 MG tablet   Oral   Take 500 mg by mouth 2 (two) times daily.          . nitroGLYCERIN (NITROSTAT) 0.4 MG SL tablet   Sublingual   Place 0.4 mg under the tongue every 5 (five) minutes as needed for chest pain.           Triage Vitals:BP 122/58  Pulse 93  Temp(Src) 99.9 F (37.7 C) (Oral)  Resp 20  SpO2 81%  Vital signs normal except for hypoxia   Physical Exam  Nursing note and vitals reviewed. Constitutional: He is oriented to person, place, and time. He appears well-developed and well-nourished.  Non-toxic appearance. He does not appear ill. No distress.  HENT:  Head: Normocephalic and atraumatic.  Right Ear: External ear normal.  Left Ear: External ear normal.  Nose: Nose normal. No mucosal edema or rhinorrhea.  Mouth/Throat: Oropharynx is clear and moist and mucous membranes are normal. No dental abscesses or uvula swelling.  Eyes: Conjunctivae and EOM are normal. Pupils are equal, round, and reactive to light.  Neck: Normal range of motion and full passive range of motion without pain. Neck supple.  Cardiovascular: Normal rate, regular rhythm and normal heart sounds.  Exam reveals no gallop and no friction rub.   No murmur heard. Pulmonary/Chest: Tachypnea noted. He is in respiratory distress. He has decreased breath sounds. He has no wheezes. He has no rhonchi. He has no rales. He exhibits no tenderness and no crepitus.  Diminished breath sounds  Abdominal: Soft. Normal appearance and bowel sounds are normal.  He exhibits no  distension. There is no tenderness. There is no rebound and no guarding.  Musculoskeletal: Normal range of motion. He exhibits no edema and no tenderness.  Moves all extremities well.   Neurological: He is alert and oriented to person, place, and time. He has normal strength. No cranial nerve deficit.  Patient knows the month, year, where he is currently, the city he is in, and the president. Patient has mild tremor which family states he has "touch of Parkinson's"  Skin: Skin is warm, dry and intact. No rash noted. No erythema. No pallor.  Small abrasion the size of a dime on R anterior knee. No effusion.   Psychiatric: He has a normal mood and affect. His speech is normal and behavior is normal. His mood appears not anxious.    ED Course  Procedures  Medications  levofloxacin (LEVAQUIN) IVPB 500 mg (500 mg Intravenous New Bag/Given 02/12/13 2326)  albuterol (PROVENTIL) (2.5 MG/3ML) 0.083% nebulizer solution 5 mg (5 mg Nebulization Given 02/12/13 2142)  ipratropium (ATROVENT) nebulizer solution 0.5 mg (0.5 mg Nebulization Given 02/12/13 2142)  methylPREDNISolone sodium succinate (SOLU-MEDROL) 125 mg/2 mL injection 125 mg (125 mg Intravenous Given 02/12/13 2326)    DIAGNOSTIC STUDIES: Oxygen Saturation is 81% on RA, abnormal low by my interpretation.    COORDINATION OF CARE: 7:08 PM-Discussed treatment plan which includes labs and albuterol tx while in ED.  Pt agreed to plan.   Patient was placed on nasal cannula oxygen and his pulse ox improved. Patient's tremor seems to be a little worse after his nebulizer.  10:59 PM-Informed pt of CT scan and lab findings. Advised pt he will need to be admitted and receive antibiotics and further tx. Family of pt present and convincing him to stay overnight due to pt refusing to stay overnight. Pt was dressed and walked out of the room. His pulse ox was 78% on RA. Pt finally agreed to stay overnight. He was started on IV steroids and antibiotics for his  acute COPD exacerbation. His confusion noted by family may be from his hypoxia, he may require home oxygen.  23:15 Dr Everette Rank admit to [facility] Review Results for orders placed during the hospital encounter of 02/12/13  URINALYSIS, ROUTINE W REFLEX MICROSCOPIC      Result Value Range   Color, Urine YELLOW  YELLOW   APPearance CLEAR  CLEAR   Specific Gravity, Urine 1.015  1.005 - 1.030   pH 5.5  5.0 - 8.0   Glucose, UA NEGATIVE  NEGATIVE mg/dL   Hgb urine dipstick MODERATE (*) NEGATIVE   Bilirubin Urine NEGATIVE  NEGATIVE   Ketones, ur NEGATIVE  NEGATIVE mg/dL   Protein, ur NEGATIVE  NEGATIVE mg/dL   Urobilinogen, UA 0.2  0.0 - 1.0 mg/dL   Nitrite NEGATIVE  NEGATIVE   Leukocytes, UA NEGATIVE  NEGATIVE  CBC WITH DIFFERENTIAL      Result Value Range   WBC 9.5  4.0 - 10.5 K/uL   RBC 4.33  4.22 - 5.81 MIL/uL   Hemoglobin 12.8 (*) 13.0 - 17.0 g/dL   HCT 39.6  39.0 - 52.0 %   MCV 91.5  78.0 - 100.0 fL   MCH 29.6  26.0 - 34.0 pg   MCHC 32.3  30.0 - 36.0 g/dL   RDW 14.6  11.5 - 15.5 %   Platelets 249  150 - 400 K/uL   Neutrophils Relative % 74  43 - 77 %   Neutro Abs 7.0  1.7 -  7.7 K/uL   Lymphocytes Relative 13  12 - 46 %   Lymphs Abs 1.2  0.7 - 4.0 K/uL   Monocytes Relative 8  3 - 12 %   Monocytes Absolute 0.7  0.1 - 1.0 K/uL   Eosinophils Relative 4  0 - 5 %   Eosinophils Absolute 0.4  0.0 - 0.7 K/uL   Basophils Relative 1  0 - 1 %   Basophils Absolute 0.1  0.0 - 0.1 K/uL  COMPREHENSIVE METABOLIC PANEL      Result Value Range   Sodium 133 (*) 137 - 147 mEq/L   Potassium 4.4  3.7 - 5.3 mEq/L   Chloride 90 (*) 96 - 112 mEq/L   CO2 33 (*) 19 - 32 mEq/L   Glucose, Bld 184 (*) 70 - 99 mg/dL   BUN 46 (*) 6 - 23 mg/dL   Creatinine, Ser 2.36 (*) 0.50 - 1.35 mg/dL   Calcium 8.7  8.4 - 10.5 mg/dL   Total Protein 7.9  6.0 - 8.3 g/dL   Albumin 3.5  3.5 - 5.2 g/dL   AST 55 (*) 0 - 37 U/L   ALT 48  0 - 53 U/L   Alkaline Phosphatase 112  39 - 117 U/L   Total Bilirubin 0.4   0.3 - 1.2 mg/dL   GFR calc non Af Amer 25 (*) >90 mL/min   GFR calc Af Amer 29 (*) >90 mL/min  PRO B NATRIURETIC PEPTIDE      Result Value Range   Pro B Natriuretic peptide (BNP) 330.7  0 - 450 pg/mL  URINE MICROSCOPIC-ADD ON      Result Value Range   RBC / HPF 3-6  <3 RBC/hpf   Imaging Review Dg Chest 1 View  02/12/2013   CLINICAL DATA:  Back pain after fall  EXAM: CHEST - 1 VIEW  COMPARISON:  09/08/2012  FINDINGS: Mild cardiomegaly, stable from before. Unchanged tortuosity of the aorta and great vessels. Chronic elevation of the right diaphragm. No evidence of contusion, hemothorax, or pneumothorax. Chronic interstitial coarsening diffusely.  IMPRESSION: Stable exam.   No active disease.   Electronically Signed   By: Jorje Guild M.D.   On: 02/12/2013 21:44   Dg Lumbar Spine Complete  02/12/2013   CLINICAL DATA:  Fall with back pain  EXAM: LUMBAR SPINE - COMPLETE 4+ VIEW  COMPARISON:  05/30/2010 abdominal CT  FINDINGS: No evidence of acute fracture or traumatic subluxation. Diffuse endplate spondylosis, with spurring most notable anteriorly at the L3-4 disc space. No is significant progression of degenerative disc narrowing from 2012.  Long-standing osteonecrosis of the femoral heads, with geographic sclerosis. No evidence of collapse.  Extensive abdominal aortic atherosclerosis.  IMPRESSION: No acute osseous abnormality.   Electronically Signed   By: Jorje Guild M.D.   On: 02/12/2013 21:06   Ct Head Wo Contrast  02/12/2013   CLINICAL DATA:  Hypertension, cerebral vascular disease. Fall with bruising on the forehead  EXAM: CT HEAD WITHOUT CONTRAST  TECHNIQUE: Contiguous axial images were obtained from the base of the skull through the vertex without intravenous contrast.  COMPARISON:  CT HEAD W/O CM dated 09/08/2012 pool  FINDINGS: No intracranial hemorrhage. No parenchymal contusion. No midline shift or mass effect. Basilar cisterns are patent. No skull base fracture. No fluid in the  paranasal sinuses or mastoid air cells.  There is generalized cortical atrophy. Periventricular subcortical white matter hypodensities.  IMPRESSION: No intracranial trauma.  Atrophy and microvascular disease.   Electronically  Signed   By: Suzy Bouchard M.D.   On: 02/12/2013 21:04    EKG Interpretation    Date/Time:  Saturday February 12 2013 20:22:23 EST Ventricular Rate:  72 PR Interval:  190 QRS Duration: 146 QT Interval:  406 QTC Calculation: 444 R Axis:   -57 Text Interpretation:  Normal sinus rhythm Right bundle branch block Left anterior fascicular block  Bifascicular block When compared with ECG of 30-May-2010 13:02, PR interval has decreased Confirmed by Ebba Goll  MD-I, Johnda Billiot ([id number]) on 02/12/2013 11:23:11 PM            MDM   1. COPD exacerbation   2. Hypoxia     Plan admission  Rolland Porter, MD, FACEP   I personally performed the services described in this documentation, which was scribed in my presence. The recorded information has been reviewed and considered.  Rolland Porter, MD, Abram Sander    Johnathan Norrie, MD 02/12/13 [id number]

## 2013-02-12 NOTE — ED Notes (Signed)
Patient cannot stand without becoming weak.

## 2013-02-13 LAB — GLUCOSE, CAPILLARY
Glucose-Capillary: 235 mg/dL — ABNORMAL HIGH (ref 70–99)
Glucose-Capillary: 364 mg/dL — ABNORMAL HIGH (ref 70–99)
Glucose-Capillary: 209 mg/dL — ABNORMAL HIGH (ref 70–99)
Glucose-Capillary: 142 mg/dL — ABNORMAL HIGH (ref 70–99)

## 2013-02-13 LAB — INFLUENZA PANEL BY PCR (TYPE A & B)
H1N1 flu by pcr: NOT DETECTED
INFLBPCR: NEGATIVE
Influenza A By PCR: NEGATIVE

## 2013-02-13 MED ORDER — SIMVASTATIN 20 MG PO TABS
20.0000 mg | ORAL_TABLET | Freq: Every day | ORAL | Status: DC
  Administered 2013-02-13 (×2): 20 mg via ORAL
  Filled 2013-02-13 (×2): qty 1

## 2013-02-13 MED ORDER — METFORMIN HCL ER 500 MG PO TB24: 500.0000 mg | ORAL_TABLET | Freq: Every day | ORAL | Status: DC

## 2013-02-13 MED ORDER — TOLBUTAMIDE 500 MG PO TABS
500.0000 mg | ORAL_TABLET | Freq: Two times a day (BID) | ORAL | Status: DC
  Administered 2013-02-13 – 2013-02-14 (×3): 500 mg via ORAL
  Filled 2013-02-13 (×7): qty 1

## 2013-02-13 MED ORDER — ALBUTEROL SULFATE (2.5 MG/3ML) 0.083% IN NEBU
5.0000 mg | INHALATION_SOLUTION | RESPIRATORY_TRACT | Status: AC | PRN
  Administered 2013-02-13: 5 mg via RESPIRATORY_TRACT
  Filled 2013-02-13: qty 6

## 2013-02-13 MED ORDER — INSULIN ASPART 100 UNIT/ML ~~LOC~~ SOLN: 0.0000 [IU] | Freq: Every day | SUBCUTANEOUS | Status: DC

## 2013-02-13 MED ORDER — LEVOTHYROXINE SODIUM 100 MCG PO TABS
100.0000 ug | ORAL_TABLET | Freq: Every day | ORAL | Status: DC
  Administered 2013-02-13 – 2013-02-14 (×2): 100 ug via ORAL
  Filled 2013-02-13 (×2): qty 1

## 2013-02-13 MED ORDER — ALPRAZOLAM 1 MG PO TABS
2.0000 mg | ORAL_TABLET | Freq: Three times a day (TID) | ORAL | Status: DC | PRN
  Administered 2013-02-13 (×2): 2 mg via ORAL
  Filled 2013-02-13 (×2): qty 2

## 2013-02-13 MED ORDER — LEVOFLOXACIN 500 MG PO TABS
500.0000 mg | ORAL_TABLET | ORAL | Status: DC
  Administered 2013-02-14: 500 mg via ORAL
  Filled 2013-02-13: qty 1

## 2013-02-13 MED ORDER — NITROGLYCERIN 0.4 MG SL SUBL: 0.4000 mg | SUBLINGUAL_TABLET | SUBLINGUAL | Status: DC | PRN

## 2013-02-13 MED ORDER — HYDROCODONE-ACETAMINOPHEN 7.5-325 MG PO TABS
1.0000 | ORAL_TABLET | Freq: Four times a day (QID) | ORAL | Status: DC | PRN
  Administered 2013-02-13: 1 via ORAL
  Filled 2013-02-13: qty 1

## 2013-02-13 MED ORDER — HYDROCHLOROTHIAZIDE 12.5 MG PO CAPS
12.5000 mg | ORAL_CAPSULE | Freq: Every day | ORAL | Status: DC
  Administered 2013-02-13 – 2013-02-14 (×2): 12.5 mg via ORAL
  Filled 2013-02-13 (×2): qty 1

## 2013-02-13 MED ORDER — LEVOFLOXACIN 500 MG PO TABS: 500.0000 mg | ORAL_TABLET | Freq: Every day | ORAL | Status: DC

## 2013-02-13 MED ORDER — INSULIN ASPART 100 UNIT/ML ~~LOC~~ SOLN
0.0000 [IU] | Freq: Three times a day (TID) | SUBCUTANEOUS | Status: DC
Start: 2013-02-13 — End: 2013-02-14
  Administered 2013-02-13 (×2): 5 [IU] via SUBCUTANEOUS
  Administered 2013-02-13: 15 [IU] via SUBCUTANEOUS
  Administered 2013-02-14: 3 [IU] via SUBCUTANEOUS

## 2013-02-13 MED ORDER — ASPIRIN EC 81 MG PO TBEC
81.0000 mg | DELAYED_RELEASE_TABLET | Freq: Every day | ORAL | Status: DC
  Administered 2013-02-13 – 2013-02-14 (×2): 81 mg via ORAL
  Filled 2013-02-13 (×2): qty 1

## 2013-02-13 MED ORDER — ENOXAPARIN SODIUM 40 MG/0.4ML ~~LOC~~ SOLN
40.0000 mg | SUBCUTANEOUS | Status: DC
  Administered 2013-02-13: 40 mg via SUBCUTANEOUS
  Filled 2013-02-13: qty 0.4

## 2013-02-13 MED ORDER — LEVOFLOXACIN 500 MG PO TABS: 500.0000 mg | ORAL_TABLET | ORAL | Status: DC

## 2013-02-13 MED ORDER — LOSARTAN POTASSIUM 50 MG PO TABS
100.0000 mg | ORAL_TABLET | Freq: Every day | ORAL | Status: DC
  Administered 2013-02-13 – 2013-02-14 (×2): 100 mg via ORAL
  Filled 2013-02-13 (×2): qty 2

## 2013-02-13 MED ORDER — METOPROLOL TARTRATE 25 MG PO TABS
25.0000 mg | ORAL_TABLET | Freq: Two times a day (BID) | ORAL | Status: DC
  Administered 2013-02-13 – 2013-02-14 (×2): 25 mg via ORAL
  Filled 2013-02-13 (×3): qty 1

## 2013-02-13 MED ORDER — CLONIDINE HCL 0.1 MG PO TABS
0.1000 mg | ORAL_TABLET | Freq: Two times a day (BID) | ORAL | Status: DC
  Administered 2013-02-13 – 2013-02-14 (×4): 0.1 mg via ORAL
  Filled 2013-02-13 (×4): qty 1

## 2013-02-13 NOTE — Progress Notes (Signed)
  CARE MANAGEMENT ED NOTE 02/13/2013  Patient:  Johnathan, Peterson   Account Number:  [id number]  Date Initiated:  02/13/2013  Documentation initiated by:  Joan Flores  Subjective/Objective Assessment:     Subjective/Objective Assessment Detail:   52 male presented to ED complain of falling at home and hitting head. Patient has history of COPD/Asthma and multiple cardiac comorbidities.  Patient denies:nausea, vomiting, diarrhea, cough or fever.  Assessment was performed and noted of patient in respiratory distress and tachypnea with 02 level @ 81% and placed on 3.5 liters oxygen.    Patient admitted and nebulizers, inhalers, steroids, and antibiotics administered.     Action/Plan:   Action/Plan Detail:   Anticipated DC Date:  02/16/2013     Status Recommendation to Physician:   Result of Recommendation:      DC Planning Services  CM consult    Choice offered to / List presented to:            Status of service:  Completed, signed off  ED Comments:   ED Comments Detail:

## 2013-02-14 LAB — GLUCOSE, CAPILLARY
Glucose-Capillary: 155 mg/dL — ABNORMAL HIGH (ref 70–99)
Glucose-Capillary: 149 mg/dL — ABNORMAL HIGH (ref 70–99)

## 2013-02-14 MED ORDER — LEVOFLOXACIN 500 MG PO TABS: 500.0000 mg | ORAL_TABLET | ORAL | Status: DC

## 2013-02-14 NOTE — Progress Notes (Signed)
Pt is to be discharged home today. Pt is in NAD, IV is out, all paperwork has been reviewed/discussed with patient/family, and there are no questions/concerns at this time. Assessment is unchanged from this morning. Pt is to be accompanied downstairs by staff and family via wheelchair

## 2013-02-14 NOTE — Progress Notes (Addendum)
At rest on 3L O2 pt is sating at 92%  At rest on RA pt is sating at 89%  Walking on RA pt is sating at 87%  Walking on 3L O2 pt is sating at 91%  At rest on 3L pt is sating at 92%

## 2013-02-14 NOTE — Discharge Summary (Signed)
Physician Discharge Summary  Patient ID: [name] MRN: [id number] DOB/AGE: [birth year] 78 y.o. Primary Care Physician:KNOWLTON,STEPHEN D, MD Admit date: 02/12/2013 Discharge date: 02/14/2013    Discharge Diagnoses:  1. Oxygen-dependent COPD with possible exacerbation. 2. Accidental fall without any bony injury. 3. Chronic kidney disease. 4. Coronary artery disease, stable. 5. Type 2 diabetes mellitus. 6. Cerebrovascular disease, stable. 7. Hypertension. 8. Hypothyroidism.     Medication List         alprazolam 2 MG tablet  Commonly known as:  XANAX  Take 2 mg by mouth 3 (three) times daily as needed for sleep or anxiety.     aspirin 81 MG EC tablet  Take 81 mg by mouth daily.     cloNIDine 0.1 MG tablet  Commonly known as:  CATAPRES  Take 0.1 mg by mouth 2 (two) times daily.     HYDROcodone-acetaminophen 7.5-325 MG per tablet  Commonly known as:  NORCO  Take 1 tablet by mouth every 6 (six) hours as needed for pain.     levofloxacin 500 MG tablet  Commonly known as:  LEVAQUIN  Take 1 tablet (500 mg total) by mouth every other day.     levothyroxine 100 MCG tablet  Commonly known as:  SYNTHROID, LEVOTHROID  Take 100 mcg by mouth daily.     losartan-hydrochlorothiazide 100-12.5 MG per tablet  Commonly known as:  HYZAAR  Take 1 tablet by mouth daily.     metFORMIN 500 MG 24 hr tablet  Commonly known as:  GLUCOPHAGE-XR  Take 500 mg by mouth daily.     metoprolol tartrate 25 MG tablet  Commonly known as:  LOPRESSOR  Take 25 mg by mouth 2 (two) times daily.     nitroGLYCERIN 0.4 MG SL tablet  Commonly known as:  NITROSTAT  Place 0.4 mg under the tongue every 5 (five) minutes as needed for chest pain.     pravastatin 40 MG tablet  Commonly known as:  PRAVACHOL  Take 40 mg by mouth at bedtime.     TOLBUTamide 500 MG tablet  Commonly known as:  ORINASE  Take 500 mg by mouth 2 (two) times daily.        Discharged Condition: Stable.    Consults:  None.  Significant Diagnostic Studies: Dg Chest 1 View  02/12/2013   CLINICAL DATA:  Back pain after fall  EXAM: CHEST - 1 VIEW  COMPARISON:  09/08/2012  FINDINGS: Mild cardiomegaly, stable from before. Unchanged tortuosity of the aorta and great vessels. Chronic elevation of the right diaphragm. No evidence of contusion, hemothorax, or pneumothorax. Chronic interstitial coarsening diffusely.  IMPRESSION: Stable exam.   No active disease.   Electronically Signed   By: Jorje Guild M.D.   On: 02/12/2013 21:44   Dg Lumbar Spine Complete  02/12/2013   CLINICAL DATA:  Fall with back pain  EXAM: LUMBAR SPINE - COMPLETE 4+ VIEW  COMPARISON:  05/30/2010 abdominal CT  FINDINGS: No evidence of acute fracture or traumatic subluxation. Diffuse endplate spondylosis, with spurring most notable anteriorly at the L3-4 disc space. No is significant progression of degenerative disc narrowing from 2012.  Long-standing osteonecrosis of the femoral heads, with geographic sclerosis. No evidence of collapse.  Extensive abdominal aortic atherosclerosis.  IMPRESSION: No acute osseous abnormality.   Electronically Signed   By: Jorje Guild M.D.   On: 02/12/2013 21:06   Ct Head Wo Contrast  02/12/2013   CLINICAL DATA:  Hypertension, cerebral vascular disease. Fall with bruising on the  forehead  EXAM: CT HEAD WITHOUT CONTRAST  TECHNIQUE: Contiguous axial images were obtained from the base of the skull through the vertex without intravenous contrast.  COMPARISON:  CT HEAD W/O CM dated 09/08/2012 pool  FINDINGS: No intracranial hemorrhage. No parenchymal contusion. No midline shift or mass effect. Basilar cisterns are patent. No skull base fracture. No fluid in the paranasal sinuses or mastoid air cells.  There is generalized cortical atrophy. Periventricular subcortical white matter hypodensities.  IMPRESSION: No intracranial trauma.  Atrophy and microvascular disease.   Electronically Signed   By: Suzy Bouchard M.D.   On:  02/12/2013 21:04    Lab Results: Basic Metabolic Panel:  Recent Labs  02/12/13 1938  NA 133*  K 4.4  CL 90*  CO2 33*  GLUCOSE 184*  BUN 46*  CREATININE 2.36*  CALCIUM 8.7   Liver Function Tests:  Recent Labs  02/12/13 1938  AST 55*  ALT 48  ALKPHOS 112  BILITOT 0.4  PROT 7.9  ALBUMIN 3.5     CBC:  Recent Labs  02/12/13 1938  WBC 9.5  NEUTROABS 7.0  HGB 12.8*  HCT 39.6  MCV 91.5  PLT 249       Hospital Course: This 80 year old man presented to the hospital having had a fall. He was not found to have any bony injury. He did have a head injury but there was no loss of consciousness and a CT scan of his brain was normal. He has had no neurological sequelae from this fall. When he presented to the emergency room, he was found to be hypoxemic. This is why he was admitted. He denied actually feeling increasingly short of breath, denies cough or fever. He feels well and is keen to go home. He quit smoking approximately one year ago, prior to that had been smoking a pack of cigarettes a day for decades. He has done well loss in the hospital but has required oxygen to maintain saturations. He will need home oxygen on discharge and this can be monitored at home. He has agreed to this. Your he has a followup appointment with his primary care physician in April but he may need to see [place] sooner in the next 2-3 weeks.  Discharge Exam: Blood pressure 120/60, pulse 82, temperature 98.3 F (36.8 C), temperature source Oral, resp. rate 18, height 5\' 10"  (1.778 m), weight 98.385 kg (216 lb 14.4 oz), SpO2 93.00%. He looks systemically well. There is no increased work of breathing. There is no peripheral central cyanosis. Lung fields are entirely clear without any wheezing, crackles or bronchial breathing. Heart sounds are present with occasional ectopic beats. They appear to be in sinus rhythm. There is no evidence of clinical heart failure. He is alert and orientated without any focal  neurological signs.  Disposition: Home with home oxygen therapy at 3 L per minute.      Discharge Orders   Future Orders Complete By Expires   Diet - low sodium heart healthy  As directed    Increase activity slowly  As directed       Follow-up Information   Follow up with Robert Bellow, MD. Schedule an appointment as soon as possible for a visit in 2 weeks.   Specialty:  Family Medicine   Contact information:   [place] [address] [phone]       Signed: Doree Albee   02/14/2013, 8:29 AM

## 2013-02-14 NOTE — Progress Notes (Signed)
NAME:  [name], [name]                ACCOUNT NO.:  [id number]  MEDICAL RECORD NO.:  [id number]  LOCATION:  A320                          FACILITY:  [facility]  PHYSICIAN:  Shakeya Kerkman G. Everette Rank, MD   DATE OF BIRTH:  1933/10/15  DATE OF PROCEDURE: DATE OF DISCHARGE:                                PROGRESS NOTE   SUBJECTIVE:  This patient is more alert today, but still does not want to stay in the hospital.  His O2 sats now are in the 90s and is having less difficulty breathing.  Apparently, did have a fall at home prior to coming here, but no serious injuries.  OBJECTIVE:  GENERAL:  The patient is alert and oriented. VITAL SIGNS:  Blood pressure 123/68, respirations 20, pulse 68, and temperature 97.3. HEENT:  Eyes, PERRLA.  TM negative.  Oropharynx benign. NECK:  Supple.  No JVD or thyroid abnormalities. HEART:  Regular rhythm. LUNGS:  Occasional rhonchus. ABDOMEN:  No palpable organs or masses.  ASSESSMENT:  The patient was admitted to the hospital following a fall at home, which resulted in no major injury.  He did have extremely low oxygen levels in the 70s.  Prior to the institution of the oxygenating, O2 sats at 92%.  This is in all likelihood secondary to COPD.  PLAN:  Continue with current oxygen dosage.  We will continue Levaquin 500 mg daily and nebulizer treatments.     Valyn Latchford G. Everette Rank, MD     AGM/MEDQ  D:  02/13/2013  T:  02/13/2013  Job:  [id number]

## 2013-02-14 NOTE — Care Management Note (Signed)
    Page 1 of 1   02/14/2013     11:44:39 AM   CARE MANAGEMENT NOTE 02/14/2013  Patient:  [name], [name]   Account Number:  [id number]  Date Initiated:  02/14/2013  Documentation initiated by:  Claretha Cooper  Subjective/Objective Assessment:   Pt admitted form home where he lives alone. [place] back to home with [place] RN (per Asher Muir) O2 to be delivered from [place]     Action/Plan:   Anticipated DC Date:  02/14/2013   Anticipated DC Plan:  [place]  CM consult      Choice offered to / List presented to:     DME arranged  [facility]      DME agency  [facility]     [place] arranged  [facility] RN      [place].   Status of service:  Completed, signed off Medicare Important Message given?   (If response is "NO", the following Medicare IM given date fields will be blank) Date Medicare IM given:   Date Additional Medicare IM given:    Discharge Disposition:  [place]  Per UR Regulation:    If discussed at Long Length of Stay Meetings, dates discussed:    Comments:  02/14/13 Claretha Cooper RN [facility] CM

## 2013-03-17 NOTE — H&P (Signed)
NAME:  [name], [name]                ACCOUNT NO.:  [id number]  MEDICAL RECORD NO.:  [id number]  LOCATION:  A320                          FACILITY:  [facility]  PHYSICIAN:  Manaia Samad G. Everette Rank, MD   DATE OF BIRTH:  1933-12-31  DATE OF ADMISSION:  02/12/2013 DATE OF DISCHARGE:  02/02/2015LH                             HISTORY & PHYSICAL   HISTORY OF PRESENT ILLNESS:  This 80 year old patient presented to the emergency room with a chief complaint of having had a fall at home.  He apparently did have a head injury as noted by the ED physician, but CT of the brain was normal.  He had no neurological abnormalities, but was found to be somewhat hypoxemic in the emergency department and was asked to remain in the hospital for further evaluation.  Apparently, the patient does have history of COPD and has been a cigarette smoker, but had stopped smoking.  SOCIAL HISTORY:  The patient was a former cigarette smoker, does not use alcohol.  PAST MEDICAL HISTORY: 1. Hyperlipidemia. 2. Hypertension. 3. Hypothyroidism. 4. COPD. 5. Anemia. 6. Arteriosclerotic cardiovascular disease. 7. Diabetes mellitus. 8. Elevated PSA. 9. Previous history of MI.  PAST SURGICAL HISTORY:  Sinus surgery, ulnar nerve repair, dental surgery, colonoscopy, stent placement.  FAMILY HISTORY:  Father heart disease, hyperlipidemia, hypertension, and diabetes.  Mother hypertension.  REVIEW OF SYSTEMS:  HEENT:  Negative.  CARDIOPULMONARY:  No cough, hemoptysis, or dyspnea.  GI:  No bowel irregularity or bleeding.  GU: No dysuria or hematuria.  ALLERGIES:  To PREDNISONE.  HOME MEDICATIONS: 1. Alprazolam 2 mg 3 times a day as needed. 2. Aspirin 81 mg daily. 3. Clonidine 0.1 mg b.i.d. 4. Norco 7.5/325 every 6 hours as needed for pain. 5. Synthroid 100 mcg daily. 6. Hyzaar 100/12.5 mg daily. 7. Metformin 500 mg daily. 8. Metoprolol 25 mg b.i.d. 9. Pravachol 40 mg at bedtime. 10.Ornase 500 mg b.i.d. 11.Nitrostat 0.4 mg  p.r.n. as needed.  PHYSICAL EXAMINATION:  GENERAL:  Alert and oriented patient. VITAL SIGNS:  Blood pressure 122/58, temperature 99, respirations 18, pulse 80. HEENT:  Eyes PERRLA.  TMs negative.  Oropharynx benign. NECK:  Supple.  No JVD or thyroid abnormalities. HEART:  Regular rhythm.  No murmurs. LUNGS:  Diminished breath sounds bilaterally.  No rales. ABDOMEN:  No palpable organs or masses.  No organomegaly. EXTREMITIES:  Free of edema. SKIN:  Small abrasions, right knee.  ASSESSMENT:  The patient was admitted with chronic obstructive pulmonary disease exacerbation and hypoxia.  He did have a fall with only minor injury initially.  PLAN:  To begin neb treatments, IV antibiotics, Levaquin 500 mg daily.     Olina Melfi G. Everette Rank, MD     AGM/MEDQ  D:  03/16/2013  T:  03/17/2013  Job:  [id number]

## 2013-03-30 IMAGING — CT CT CHEST W/ CM
2 of 4 series · 15 of 36 positions shown, 18 images · IV contrast (Omnipaque 300)
Comparison: Plain films of 04/16/2011 and 05/30/2010

CLINICAL DATA: Dyspnea and COPD.

CT CHEST WITH CONTRAST
TECHNIQUE: Multidetector CT imaging of the chest was performed
following the standard protocol during bolus administration of
intravenous contrast.
Contrast: 80mL OMNIPAQUE IOHEXOL 300 MG/ML  SOLN

[Series 2: chest routine with · axial · 0.77mm/px · z∈[-316,-26]mm · 12 of 70 slices shown, 15 images]
[im 6/70  mediastinal]
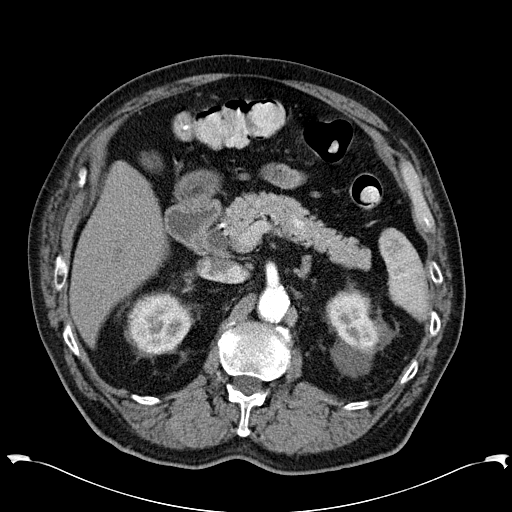
[im 6/70  lung]
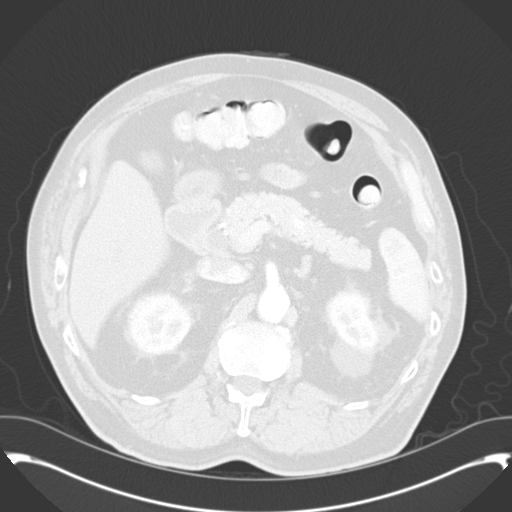
[im 11/70  lung]
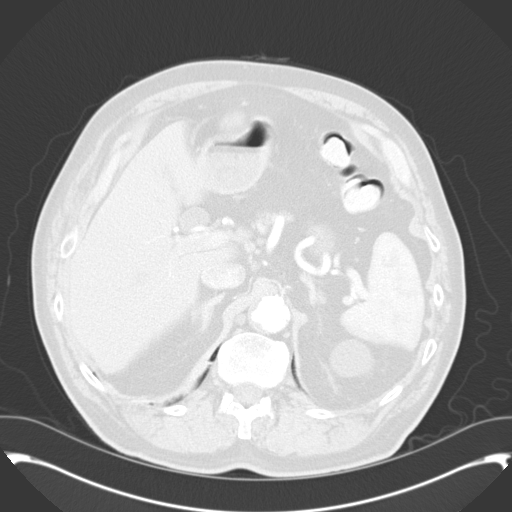
[im 16/70  lung]
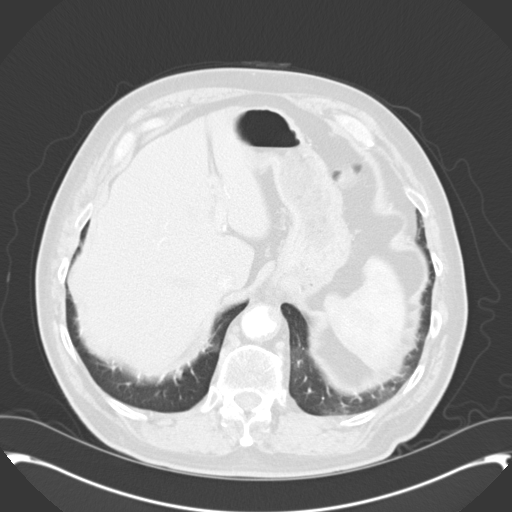
[im 22/70  lung]
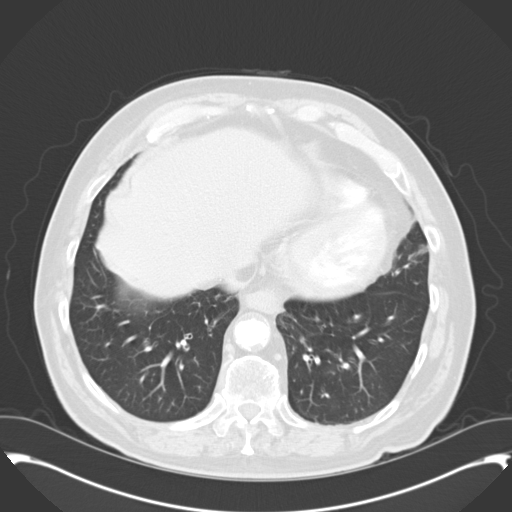
[im 27/70  mediastinal]
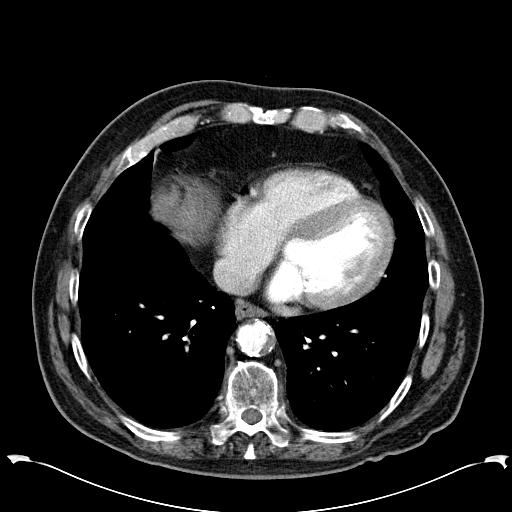
[im 27/70  lung]
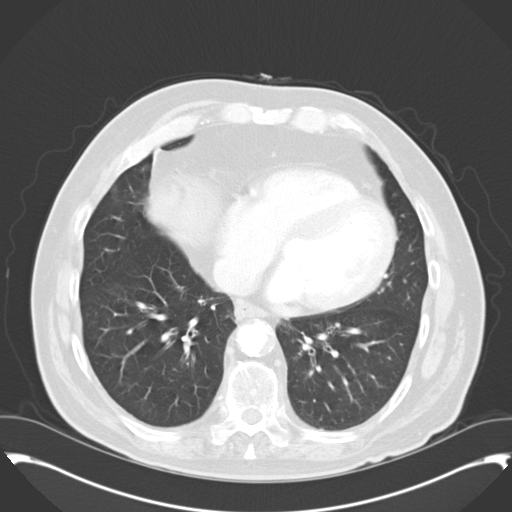
[im 32/70  lung]
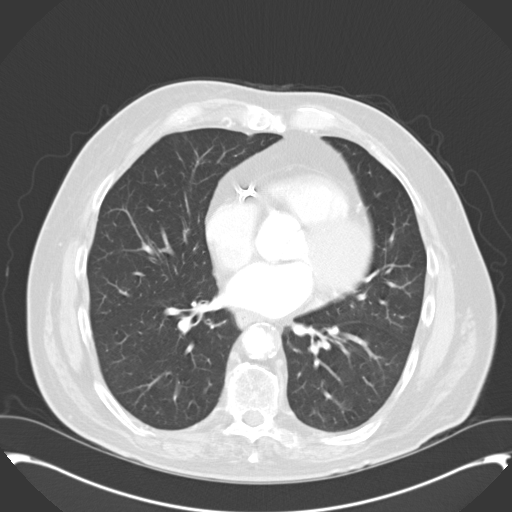
[im 38/70  lung]
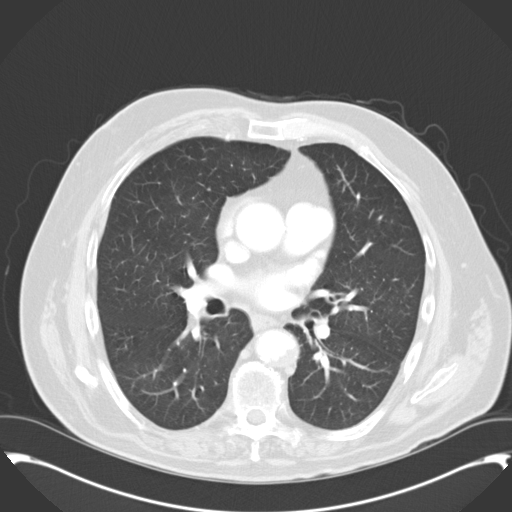
[im 43/70  lung]
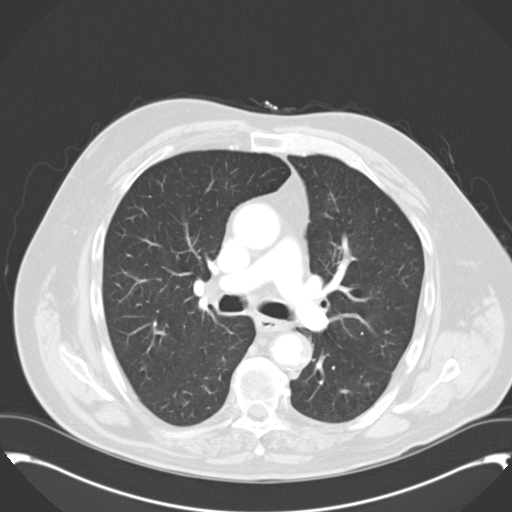
[im 48/70  mediastinal]
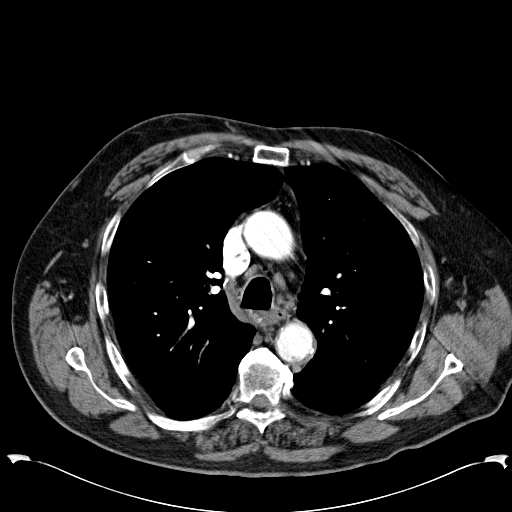
[im 48/70  lung]
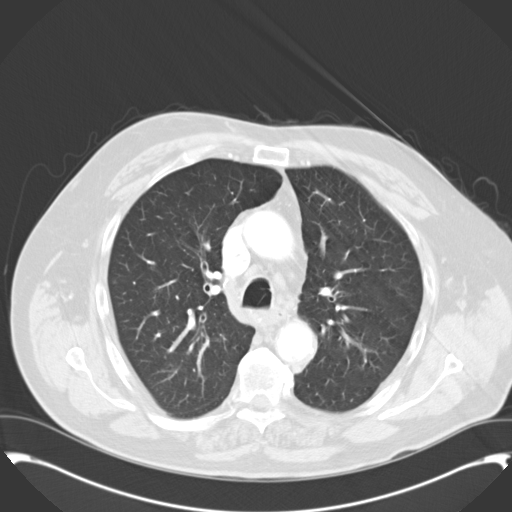
[im 54/70  lung]
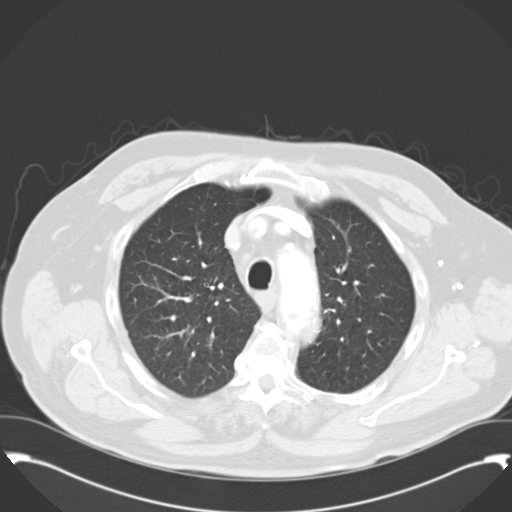
[im 59/70  lung]
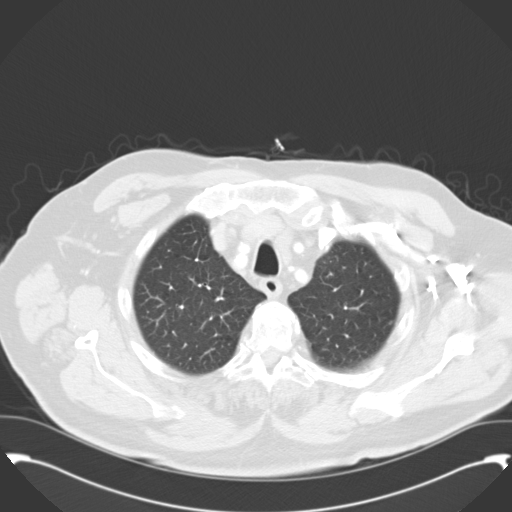
[im 64/70  lung]
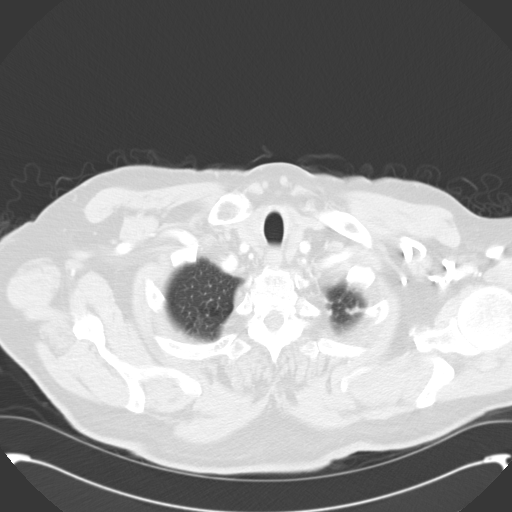

[Series 602: cor · coronal · 0.77mm/px · 3 of 132 slices shown]
[im 27/132  lung]
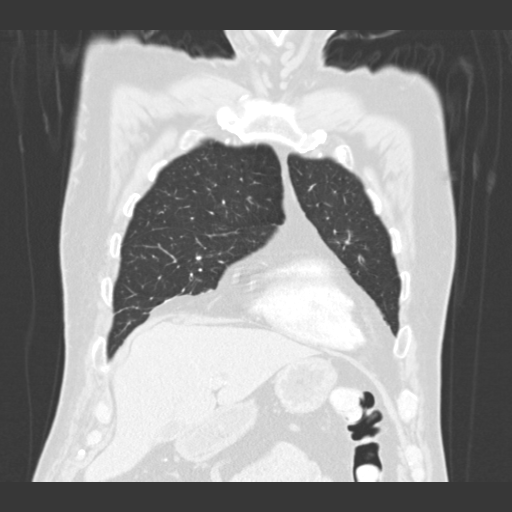
[im 53/132  lung]
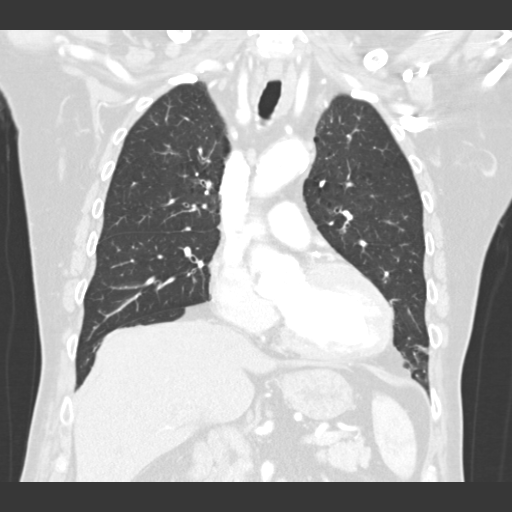
[im 79/132  lung]
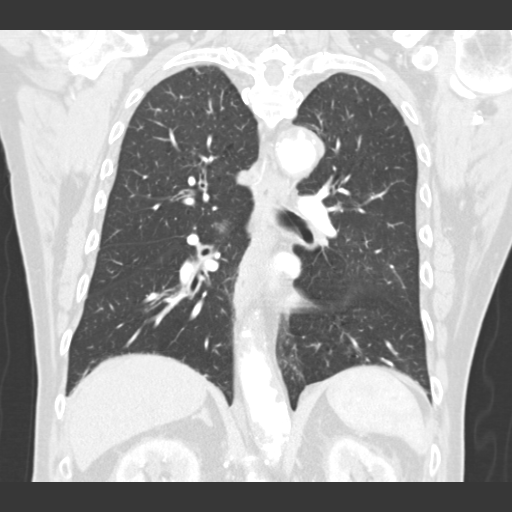

[15 of 36 positions shown; findings below may reference images not displayed]

FINDINGS: Lungs/pleura: Moderate centrilobular emphysema.
Secretions within the trachea.

Nonspecific bronchial wall thickening to the lower lobes.
Mild scarring in the posterior lingula.
No pleural fluid.

Heart/Mediastinum: Extensive aortic and branch vessel
atherosclerosis, with ulcerative plaque throughout the transverse
and proximal descending thoracic aorta.  No surrounding periaortic
hemorrhage.

Mild cardiomegaly, without pericardial effusion. Multivessel
coronary artery atherosclerosis.  No central pulmonary embolism, on
this non-dedicated study.

Numerous small nodes within the mediastinum.  A precarinal node
measures 1.3 cm on image 25.  No hilar adenopathy.

A small hiatal hernia

Upper abdomen: Severe aortic atherosclerosis continuing into the
upper abdomen.  Focal ectasia at the posterior aspect of the origin
of the left renal artery versus an adjacent area of aortic aneurysm
/penetrating ulcer.  Image 68.
Upper pole left renal cyst which measures 3.8 cm.

Bones/Musculoskeletal:  No acute osseous abnormality.
IMPRESSION: 1.  Moderate centrilobular emphysema, without other explanation for
shortness of breath.
2.  Extensive atherosclerosis, including within the coronary
arteries and throughout the abdominal aorta.  Extensive ulcerative
plaque throughout the aorta.  Abnormality along the origin of the
left renal artery which could represent focal renal artery ectasia
or an adjacent aortic focal aneurysm or penetrating ulcer.
3.  Prominent mediastinal nodes, favored to be reactive.  Follow-up
CT in 3-6 months could confirm stability or resolution.

## 2013-06-11 ENCOUNTER — Inpatient Hospital Stay (HOSPITAL_COMMUNITY): Payer: Medicare Other

## 2013-06-11 ENCOUNTER — Emergency Department (HOSPITAL_COMMUNITY): Payer: Medicare Other

## 2013-06-11 ENCOUNTER — Inpatient Hospital Stay (HOSPITAL_COMMUNITY)
Admission: EM | Admit: 2013-06-11 | Discharge: 2013-06-13 | DRG: 948 | Disposition: A | Discharge status: Home Health | Payer: Medicare Other | Attending: Internal Medicine | Admitting: Internal Medicine

## 2013-06-11 ENCOUNTER — Encounter (HOSPITAL_COMMUNITY): Payer: Self-pay | Admitting: Emergency Medicine

## 2013-06-11 DIAGNOSIS — I10 Essential (primary) hypertension: Secondary | ICD-10-CM | POA: Diagnosis present

## 2013-06-11 DIAGNOSIS — I252 Old myocardial infarction: Secondary | ICD-10-CM

## 2013-06-11 DIAGNOSIS — J449 Chronic obstructive pulmonary disease, unspecified: Secondary | ICD-10-CM

## 2013-06-11 DIAGNOSIS — E785 Hyperlipidemia, unspecified: Secondary | ICD-10-CM | POA: Diagnosis present

## 2013-06-11 DIAGNOSIS — I739 Peripheral vascular disease, unspecified: Secondary | ICD-10-CM

## 2013-06-11 DIAGNOSIS — N19 Unspecified kidney failure: Secondary | ICD-10-CM

## 2013-06-11 DIAGNOSIS — I679 Cerebrovascular disease, unspecified: Secondary | ICD-10-CM | POA: Diagnosis present

## 2013-06-11 DIAGNOSIS — T40605A Adverse effect of unspecified narcotics, initial encounter: Secondary | ICD-10-CM | POA: Diagnosis present

## 2013-06-11 DIAGNOSIS — Z9861 Coronary angioplasty status: Secondary | ICD-10-CM

## 2013-06-11 DIAGNOSIS — Z8673 Personal history of transient ischemic attack (TIA), and cerebral infarction without residual deficits: Secondary | ICD-10-CM

## 2013-06-11 DIAGNOSIS — E860 Dehydration: Secondary | ICD-10-CM

## 2013-06-11 DIAGNOSIS — G9340 Encephalopathy, unspecified: Secondary | ICD-10-CM | POA: Diagnosis present

## 2013-06-11 DIAGNOSIS — R4182 Altered mental status, unspecified: Principal | ICD-10-CM

## 2013-06-11 DIAGNOSIS — D649 Anemia, unspecified: Secondary | ICD-10-CM

## 2013-06-11 DIAGNOSIS — T424X5A Adverse effect of benzodiazepines, initial encounter: Secondary | ICD-10-CM | POA: Diagnosis present

## 2013-06-11 DIAGNOSIS — E118 Type 2 diabetes mellitus with unspecified complications: Secondary | ICD-10-CM | POA: Diagnosis present

## 2013-06-11 DIAGNOSIS — R0902 Hypoxemia: Secondary | ICD-10-CM

## 2013-06-11 DIAGNOSIS — F0390 Unspecified dementia without behavioral disturbance: Secondary | ICD-10-CM | POA: Diagnosis present

## 2013-06-11 DIAGNOSIS — Z720 Tobacco use: Secondary | ICD-10-CM

## 2013-06-11 DIAGNOSIS — R972 Elevated prostate specific antigen [PSA]: Secondary | ICD-10-CM

## 2013-06-11 DIAGNOSIS — E119 Type 2 diabetes mellitus without complications: Secondary | ICD-10-CM

## 2013-06-11 DIAGNOSIS — Z87891 Personal history of nicotine dependence: Secondary | ICD-10-CM

## 2013-06-11 DIAGNOSIS — N179 Acute kidney failure, unspecified: Secondary | ICD-10-CM | POA: Diagnosis present

## 2013-06-11 DIAGNOSIS — Z833 Family history of diabetes mellitus: Secondary | ICD-10-CM

## 2013-06-11 DIAGNOSIS — J4489 Other specified chronic obstructive pulmonary disease: Secondary | ICD-10-CM

## 2013-06-11 DIAGNOSIS — N183 Chronic kidney disease, stage 3 unspecified: Secondary | ICD-10-CM | POA: Diagnosis present

## 2013-06-11 DIAGNOSIS — Z8249 Family history of ischemic heart disease and other diseases of the circulatory system: Secondary | ICD-10-CM

## 2013-06-11 DIAGNOSIS — E039 Hypothyroidism, unspecified: Secondary | ICD-10-CM | POA: Diagnosis present

## 2013-06-11 DIAGNOSIS — I129 Hypertensive chronic kidney disease with stage 1 through stage 4 chronic kidney disease, or unspecified chronic kidney disease: Secondary | ICD-10-CM | POA: Diagnosis present

## 2013-06-11 DIAGNOSIS — F1011 Alcohol abuse, in remission: Secondary | ICD-10-CM | POA: Diagnosis present

## 2013-06-11 DIAGNOSIS — I2510 Atherosclerotic heart disease of native coronary artery without angina pectoris: Secondary | ICD-10-CM

## 2013-06-11 LAB — COMPREHENSIVE METABOLIC PANEL
ALT: 14 U/L (ref 0–53)
AST: 19 U/L (ref 0–37)
Albumin: 3.7 g/dL (ref 3.5–5.2)
Alkaline Phosphatase: 53 U/L (ref 39–117)
BUN: 47 mg/dL — AB (ref 6–23)
CALCIUM: 8.9 mg/dL (ref 8.4–10.5)
CO2: 29 mEq/L (ref 19–32)
Chloride: 103 mEq/L (ref 96–112)
Creatinine, Ser: 1.81 mg/dL — ABNORMAL HIGH (ref 0.50–1.35)
GFR calc Af Amer: 40 mL/min — ABNORMAL LOW (ref >90)
GFR calc non Af Amer: 34 mL/min — ABNORMAL LOW (ref >90)
GLUCOSE: 150 mg/dL — AB (ref 70–99)
Potassium: 4.6 mEq/L (ref 3.7–5.3)
SODIUM: 145 meq/L (ref 137–147)
Total Bilirubin: 0.4 mg/dL (ref 0.3–1.2)
Total Protein: 7.6 g/dL (ref 6.0–8.3)

## 2013-06-11 LAB — URINALYSIS, ROUTINE W REFLEX MICROSCOPIC
Bilirubin Urine: NEGATIVE
Glucose, UA: NEGATIVE mg/dL
Ketones, ur: NEGATIVE mg/dL
LEUKOCYTES UA: NEGATIVE
Nitrite: NEGATIVE
PH: 5.5 (ref 5.0–8.0)
Protein, ur: 30 mg/dL — AB
Urobilinogen, UA: 0.2 mg/dL (ref 0.0–1.0)

## 2013-06-11 LAB — TROPONIN I
Troponin I: <0.3 ng/mL (ref <0.30)
Troponin I: <0.3 ng/mL (ref <0.30)

## 2013-06-11 LAB — CBC WITH DIFFERENTIAL/PLATELET
Basophils Absolute: 0.1 10*3/uL (ref 0.0–0.1)
Basophils Relative: 1 % (ref 0–1)
EOS ABS: 0.1 10*3/uL (ref 0.0–0.7)
Eosinophils Relative: 1 % (ref 0–5)
HCT: 38.4 % — ABNORMAL LOW (ref 39.0–52.0)
Hemoglobin: 12.1 g/dL — ABNORMAL LOW (ref 13.0–17.0)
LYMPHS ABS: 1.7 10*3/uL (ref 0.7–4.0)
Lymphocytes Relative: 16 % (ref 12–46)
MCH: 28.7 pg (ref 26.0–34.0)
MCHC: 31.5 g/dL (ref 30.0–36.0)
MCV: 91.2 fL (ref 78.0–100.0)
Monocytes Absolute: 0.7 10*3/uL (ref 0.1–1.0)
Monocytes Relative: 7 % (ref 3–12)
Neutro Abs: 7.9 10*3/uL — ABNORMAL HIGH (ref 1.7–7.7)
Neutrophils Relative %: 75 % (ref 43–77)
Platelets: 241 10*3/uL (ref 150–400)
RBC: 4.21 MIL/uL — AB (ref 4.22–5.81)
RDW: 15.7 % — AB (ref 11.5–15.5)
WBC: 10.5 10*3/uL (ref 4.0–10.5)

## 2013-06-11 LAB — GLUCOSE, CAPILLARY
GLUCOSE-CAPILLARY: 109 mg/dL — AB (ref 70–99)
Glucose-Capillary: 121 mg/dL — ABNORMAL HIGH (ref 70–99)
Glucose-Capillary: 134 mg/dL — ABNORMAL HIGH (ref 70–99)

## 2013-06-11 LAB — VITAMIN B12
VITAMIN B 12: 465 pg/mL (ref 211–911)
VITAMIN B 12: 459 pg/mL (ref 211–911)

## 2013-06-11 LAB — RAPID URINE DRUG SCREEN, HOSP PERFORMED
AMPHETAMINES: NOT DETECTED
Barbiturates: NOT DETECTED
Benzodiazepines: POSITIVE — AB
Cocaine: NOT DETECTED
OPIATES: POSITIVE — AB
Tetrahydrocannabinol: NOT DETECTED

## 2013-06-11 LAB — SEDIMENTATION RATE: SED RATE: 55 mm/h — AB (ref 0–16)

## 2013-06-11 LAB — BLOOD GAS, ARTERIAL
ACID-BASE EXCESS: 1.4 mmol/L (ref 0.0–2.0)
Bicarbonate: 27.2 mEq/L — ABNORMAL HIGH (ref 20.0–24.0)
Drawn by: 23588
O2 Content: 3.5 L/min
O2 Saturation: 91.8 %
PCO2 ART: 57 mmHg — AB (ref 35.0–45.0)
PO2 ART: 71.2 mmHg — AB (ref 80.0–100.0)
Patient temperature: 37
TCO2: 25 mmol/L (ref 0–100)
pH, Arterial: 7.299 — ABNORMAL LOW (ref 7.350–7.450)

## 2013-06-11 LAB — CBC
HEMATOCRIT: 39.1 % (ref 39.0–52.0)
Hemoglobin: 12.4 g/dL — ABNORMAL LOW (ref 13.0–17.0)
MCH: 29.2 pg (ref 26.0–34.0)
MCHC: 31.7 g/dL (ref 30.0–36.0)
MCV: 92.2 fL (ref 78.0–100.0)
Platelets: 236 10*3/uL (ref 150–400)
RBC: 4.24 MIL/uL (ref 4.22–5.81)
RDW: 15.5 % (ref 11.5–15.5)
WBC: 8.8 10*3/uL (ref 4.0–10.5)

## 2013-06-11 LAB — RPR

## 2013-06-11 LAB — URINE MICROSCOPIC-ADD ON

## 2013-06-11 LAB — FOLATE

## 2013-06-11 LAB — CK: CK TOTAL: 189 U/L (ref 7–232)

## 2013-06-11 LAB — AMMONIA: AMMONIA: 24 umol/L (ref 11–60)

## 2013-06-11 LAB — TSH: TSH: 0.419 u[IU]/mL (ref 0.350–4.500)

## 2013-06-11 MED ORDER — NALOXONE HCL 0.4 MG/ML IJ SOLN
0.4000 mg | Freq: Once | INTRAMUSCULAR | Status: AC
  Administered 2013-06-11: 0.4 mg via INTRAVENOUS
  Filled 2013-06-11: qty 1

## 2013-06-11 MED ORDER — INSULIN ASPART 100 UNIT/ML ~~LOC~~ SOLN
0.0000 [IU] | Freq: Three times a day (TID) | SUBCUTANEOUS | Status: DC
  Administered 2013-06-11 – 2013-06-13 (×5): 1 [IU] via SUBCUTANEOUS

## 2013-06-11 MED ORDER — IPRATROPIUM-ALBUTEROL 0.5-2.5 (3) MG/3ML IN SOLN
3.0000 mL | Freq: Three times a day (TID) | RESPIRATORY_TRACT | Status: DC
  Administered 2013-06-12 – 2013-06-13 (×5): 3 mL via RESPIRATORY_TRACT
  Filled 2013-06-11 (×5): qty 3

## 2013-06-11 MED ORDER — TRAMADOL HCL 50 MG PO TABS
50.0000 mg | ORAL_TABLET | Freq: Four times a day (QID) | ORAL | Status: DC
  Administered 2013-06-11: 50 mg via ORAL
  Filled 2013-06-11: qty 1

## 2013-06-11 MED ORDER — SODIUM CHLORIDE 0.9 % IV BOLUS (SEPSIS)
500.0000 mL | Freq: Once | INTRAVENOUS | Status: AC
  Administered 2013-06-11: 500 mL via INTRAVENOUS

## 2013-06-11 MED ORDER — CLONIDINE HCL 0.1 MG PO TABS
0.1000 mg | ORAL_TABLET | Freq: Two times a day (BID) | ORAL | Status: DC
  Administered 2013-06-11 – 2013-06-13 (×5): 0.1 mg via ORAL
  Filled 2013-06-11 (×5): qty 1

## 2013-06-11 MED ORDER — HEPARIN SODIUM (PORCINE) 5000 UNIT/ML IJ SOLN
5000.0000 [IU] | Freq: Three times a day (TID) | INTRAMUSCULAR | Status: DC
  Administered 2013-06-11 – 2013-06-13 (×5): 5000 [IU] via SUBCUTANEOUS
  Filled 2013-06-11 (×6): qty 1

## 2013-06-11 MED ORDER — INSULIN ASPART 100 UNIT/ML ~~LOC~~ SOLN: 0.0000 [IU] | Freq: Every day | SUBCUTANEOUS | Status: DC

## 2013-06-11 MED ORDER — ALPRAZOLAM 1 MG PO TABS
1.0000 mg | ORAL_TABLET | Freq: Three times a day (TID) | ORAL | Status: DC
  Administered 2013-06-11 – 2013-06-13 (×5): 1 mg via ORAL
  Filled 2013-06-11 (×6): qty 1

## 2013-06-11 MED ORDER — LEVOTHYROXINE SODIUM 112 MCG PO TABS
112.0000 ug | ORAL_TABLET | Freq: Every day | ORAL | Status: DC
Start: 2013-06-11 — End: 2013-06-13
  Administered 2013-06-11 – 2013-06-13 (×3): 112 ug via ORAL
  Filled 2013-06-11 (×5): qty 1

## 2013-06-11 MED ORDER — DEXTROSE-NACL 5-0.9 % IV SOLN
INTRAVENOUS | Status: DC
  Administered 2013-06-11 – 2013-06-12 (×2): via INTRAVENOUS

## 2013-06-11 MED ORDER — SODIUM CHLORIDE 0.9 % IV SOLN
Freq: Once | INTRAVENOUS | Status: AC
  Administered 2013-06-11: 1000 mL via INTRAVENOUS

## 2013-06-11 MED ORDER — ALBUTEROL SULFATE (2.5 MG/3ML) 0.083% IN NEBU: 2.5000 mg | INHALATION_SOLUTION | Freq: Four times a day (QID) | RESPIRATORY_TRACT | Status: DC | PRN

## 2013-06-11 MED ORDER — TRAMADOL HCL 50 MG PO TABS
50.0000 mg | ORAL_TABLET | Freq: Four times a day (QID) | ORAL | Status: DC | PRN
  Administered 2013-06-12 (×2): 50 mg via ORAL
  Filled 2013-06-11 (×2): qty 1

## 2013-06-11 MED ORDER — SODIUM CHLORIDE 0.9 % IJ SOLN
3.0000 mL | Freq: Two times a day (BID) | INTRAMUSCULAR | Status: DC
  Administered 2013-06-13: 3 mL via INTRAVENOUS

## 2013-06-11 MED ORDER — ASPIRIN EC 81 MG PO TBEC
81.0000 mg | DELAYED_RELEASE_TABLET | Freq: Every day | ORAL | Status: DC
  Administered 2013-06-11 – 2013-06-13 (×3): 81 mg via ORAL
  Filled 2013-06-11 (×3): qty 1

## 2013-06-11 MED ORDER — METOPROLOL TARTRATE 25 MG PO TABS
25.0000 mg | ORAL_TABLET | Freq: Two times a day (BID) | ORAL | Status: DC
  Administered 2013-06-11 – 2013-06-13 (×4): 25 mg via ORAL
  Filled 2013-06-11 (×4): qty 1

## 2013-06-11 MED ORDER — IPRATROPIUM-ALBUTEROL 0.5-2.5 (3) MG/3ML IN SOLN
3.0000 mL | RESPIRATORY_TRACT | Status: DC
  Administered 2013-06-11 (×3): 3 mL via RESPIRATORY_TRACT
  Filled 2013-06-11 (×3): qty 3

## 2013-06-11 MED ORDER — HALOPERIDOL LACTATE 5 MG/ML IJ SOLN
1.0000 mg | INTRAMUSCULAR | Status: DC | PRN
  Administered 2013-06-11 – 2013-06-12 (×4): 1 mg via INTRAVENOUS
  Filled 2013-06-11 (×5): qty 1

## 2013-06-11 MED ORDER — SIMVASTATIN 10 MG PO TABS
10.0000 mg | ORAL_TABLET | Freq: Every day | ORAL | Status: DC
Start: 2013-06-11 — End: 2013-06-13
  Administered 2013-06-11 – 2013-06-12 (×2): 10 mg via ORAL
  Filled 2013-06-11 (×2): qty 1

## 2013-06-11 NOTE — Progress Notes (Signed)
Patient very agitated at this time.  Patient insisting that he is able to go home. Granddaughter present.  Requesting to speak to doctor.  Dr. Cindie Laroche paged and stated that patient was unable to go home at this time, was not safe for patient to go home at this time.  Granddaughter states that patient is currently at his baseline mentation, but may " get violent" if he is not allowed to go home.  Granddaughter was able to speak with Dr. Cindie Laroche at this time.  He explained patient condition to granddaughter, who is turn explained to her mother over the phone.  Explained to patient why he was in hospital and how important it was for him to stay here until his health issues were resolved.

## 2013-06-11 NOTE — H&P (Signed)
[facility] History and Physical  [name] ZOX:[id number] DOB: [birth year]    PCP:   Robert Bellow, MD   Chief Complaint: confusion and may have taken too much medications.  HPI: [name] is an 80 y.o. male with hx of anxiety, on Xanax 2mg  TID, along with hydrocodone, hx of hypertension, hyperlipidemia, and CVA, recovered alcoholic, hx of hypothyroidism, found by his grandson on the floor naked.  He has been lethargic and confused.  He may have taken his medications incorrectly, as per reflection on his pill box.  He may have taken a little more of his Xanax.  He denied any HA, shortness of breath or chest pain.  Family stated that he has been confused a few times per week, and he lives alone.  In the ER, he was given Narcan without effects.  A CT of his head showed no acute processes.  He has no leukocytosis, and his Cr is 1.8 with normal Na.  His calcium, CPK and LFTs were all normal.  In the ER, he was somnolent but easily arousable.  He is slightly confused, but able to converse.  Hospitalist was asked to admit him for confusion likely due to medication overdose and side effects.   Rewiew of Systems:  Constitutional: Negative for malaise, fever and chills. No significant weight loss or weight gain Eyes: Negative for eye pain, redness and discharge, diplopia, visual changes, or flashes of light. ENMT: Negative for ear pain, hoarseness, nasal congestion, sinus pressure and sore throat. No headaches; tinnitus, drooling, or problem swallowing. Cardiovascular: Negative for chest pain, palpitations, diaphoresis, dyspnea and peripheral edema. ; No orthopnea, PND Respiratory: Negative for cough, hemoptysis, wheezing and stridor. No pleuritic chestpain. Gastrointestinal: Negative for nausea, vomiting, diarrhea, constipation, abdominal pain, melena, blood in stool, hematemesis, jaundice and rectal bleeding.    Genitourinary: Negative for frequency, dysuria, incontinence,flank pain  and hematuria; Musculoskeletal: Negative for back pain and neck pain. Negative for swelling and trauma.;  Skin: . Negative for pruritus, rash, abrasions, bruising and skin lesion.; ulcerations Neuro: Negative for headache, lightheadedness and neck stiffness. Negative for weakness, altered level of consciousness , altered mental status, extremity weakness, burning feet, involuntary movement, seizure and syncope.  Psych: negative for insomnia, tearfulness, panic attacks, hallucinations, paranoia, suicidal or homicidal ideation    Past Medical History  Diagnosis Date  . Hyperlipidemia   . Hypertension   . Hypothyroid   . COPD (chronic obstructive pulmonary disease)   . Anemia   . ASCVD (arteriosclerotic cardiovascular disease)      Inferior myocardial infarction in 08/1998 requiring PCI of the RCA; moderate residual disease in the left anterior descending and first diagonal; ejection fraction of 45%  . Cerebrovascular disease      Right carotid bruit; plaque without stenosis in 2003 and 2005  . Diabetes mellitus     A1c-7.7 in 2004 with diet-controlled; 6.3 and 11/08 on oral medication  . Tobacco abuse     Consumption tapered to 2 packs per week  . Elevated PSA     prior negative prostate biopsy  . Alcohol abuse, in remission   . MI, old     Past Surgical History  Procedure Laterality Date  . Nasal sinus surgery    . Ulnar nerve repair      right arm  . Dental surgery      Right jaw; continued chronic pain  . Colonoscopy  06/2010    Dr. Laural Golden  . Stents      cardiac  stent    Medications:  HOME MEDS: Prior to Admission medications   Medication Sig Start Date End Date Taking? Authorizing Provider  alprazolam Duanne Moron) 2 MG tablet Take 2 mg by mouth 3 (three) times daily as needed for sleep or anxiety.   Yes Historical Provider, MD  aspirin 81 MG EC tablet Take 81 mg by mouth daily.     Yes Historical Provider, MD  cloNIDine (CATAPRES) 0.1 MG tablet Take 0.1 mg by mouth 2 (two)  times daily.  09/22/11  Yes Historical Provider, MD  glipiZIDE (GLUCOTROL) 5 MG tablet Take 5 mg by mouth daily before breakfast.   Yes Historical Provider, MD  HYDROcodone-acetaminophen (NORCO) 7.5-325 MG per tablet Take 1 tablet by mouth every 6 (six) hours as needed for pain.   Yes Historical Provider, MD  levothyroxine (SYNTHROID, LEVOTHROID) 112 MCG tablet Take 112 mcg by mouth daily before breakfast.   Yes Historical Provider, MD  metFORMIN (GLUCOPHAGE-XR) 500 MG 24 hr tablet Take 500 mg by mouth daily. 02/05/13  Yes Historical Provider, MD  pravastatin (PRAVACHOL) 40 MG tablet Take 40 mg by mouth at bedtime.     Yes Historical Provider, MD  levofloxacin (LEVAQUIN) 500 MG tablet Take 1 tablet (500 mg total) by mouth every other day. 02/14/13   Nimish Luther Parody, MD  levothyroxine (SYNTHROID, LEVOTHROID) 100 MCG tablet Take by mouth daily.     Historical Provider, MD  losartan-hydrochlorothiazide (HYZAAR) 100-12.5 MG per tablet Take 1 tablet by mouth daily. 02/03/13   Historical Provider, MD  metoprolol tartrate (LOPRESSOR) 25 MG tablet Take 25 mg by mouth 2 (two) times daily.     Historical Provider, MD  nitroGLYCERIN (NITROSTAT) 0.4 MG SL tablet Place 0.4 mg under the tongue every 5 (five) minutes as needed for chest pain.     Historical Provider, MD  TOLBUTamide (ORINASE) 500 MG tablet Take 500 mg by mouth 2 (two) times daily.     Historical Provider, MD     Allergies:  Allergies  Allergen Reactions  . Prednisone     Doesn't like to take high doses    Social History:   reports that he quit smoking about 2 years ago. His smoking use included Cigarettes. He has a 60 pack-year smoking history. He has never used smokeless tobacco. He reports that he does not drink alcohol or use illicit drugs.  Family History: Family History  Problem Relation Age of Onset  . Hypertension Mother   . Heart disease Father   . Hyperlipidemia Father   . Hypertension Father   . Diabetes Father       Physical Exam: Filed Vitals:   06/11/13 0130 06/11/13 0200 06/11/13 0230 06/11/13 0300  BP: 131/59 141/62 117/51 123/50  Pulse: 64  56 59  Temp:      TempSrc:      Resp: 21 20 20 18   Height:      Weight:      SpO2: 92%  96% 98%   Blood pressure 123/50, pulse 59, temperature 97.4 F (36.3 C), temperature source Oral, resp. rate 18, height 5\' 6"  (1.676 m), weight 101.606 kg (224 lb), SpO2 98.00%.  GEN:  Pleasant  patient lying in the stretcher in no acute distress; cooperative with exam. He is lethargic but arousable.  He recognize his sister in laws, knew her name, and recognized his daughter.  He thought it was September.  PSYCH:  Orient to name; does not appear anxious or depressed; affect is appropriate. HEENT: Mucous membranes pink  and anicteric; PERRLA; EOM intact; no cervical lymphadenopathy nor thyromegaly or carotid bruit; no JVD; There were no stridor. Neck is very supple. Breasts:: Not examined CHEST WALL: No tenderness CHEST: Normal respiration, clear to auscultation bilaterally.  HEART: Regular rate and rhythm.  There are no murmur, rub, or gallops.   BACK: No kyphosis or scoliosis; no CVA tenderness ABDOMEN: soft and non-tender; no masses, no organomegaly, normal abdominal bowel sounds; no pannus; no intertriginous candida. There is no rebound and no distention. Rectal Exam: Not done EXTREMITIES: No bone or joint deformity; age-appropriate arthropathy of the hands and knees; no edema; no ulcerations.  There is no calf tenderness. Genitalia: not examined PULSES: 2+ and symmetric SKIN: Normal hydration no rash or ulceration CNS: Cranial nerves 2-12 grossly intact no focal lateralizing neurologic deficit.  Speech is fluent; uvula elevated with phonation, facial symmetry and tongue midline. DTR are normal bilaterally, cerebella exam is intact, barbinski is negative and strengths are equaled bilaterally.  No sensory loss.   Labs on Admission:  Basic Metabolic  Panel:  Recent Labs Lab 06/11/13 0101  NA 145  K 4.6  CL 103  CO2 29  GLUCOSE 150*  BUN 47*  CREATININE 1.81*  CALCIUM 8.9   Liver Function Tests:  Recent Labs Lab 06/11/13 0101  AST 19  ALT 14  ALKPHOS 53  BILITOT 0.4  PROT 7.6  ALBUMIN 3.7   No results found for this basename: LIPASE, AMYLASE,  in the last 168 hours No results found for this basename: AMMONIA,  in the last 168 hours CBC:  Recent Labs Lab 06/11/13 0101  WBC 10.5  NEUTROABS 7.9*  HGB 12.1*  HCT 38.4*  MCV 91.2  PLT 241   Cardiac Enzymes:  Recent Labs Lab 06/11/13 0101  CKTOTAL 189  TROPONINI <0.30    CBG: No results found for this basename: GLUCAP,  in the last 168 hours   Radiological Exams on Admission: Ct Head Wo Contrast  06/11/2013   CLINICAL DATA:  severe Headache  EXAM: CT HEAD WITHOUT CONTRAST  TECHNIQUE: Contiguous axial images were obtained from the base of the skull through the vertex without intravenous contrast.  COMPARISON:  Prior CT from 02/12/2013  FINDINGS: Generalized cerebral atrophy with mild chronic microvascular ischemic changes again noted, unchanged.  There is no acute intracranial hemorrhage or infarct. No mass lesion or midline shift. Gray-white matter differentiation is well maintained. Ventricles are normal in size without evidence of hydrocephalus. CSF containing spaces are within normal limits. No extra-axial fluid collection.  The calvarium is intact.  Orbital soft tissues are within normal limits.  Mild polypoid mucosal thickening noted within the inferior right maxillary sinus. Otherwise, the paranasal sinuses and mastoid air cells are well pneumatized and free of fluid.  Scalp soft tissues are unremarkable.  IMPRESSION: Stable appearance of the brain with no acute intracranial abnormality identified.   Electronically Signed   By: Jeannine Boga M.D.   On: 06/11/2013 02:20   Assessment/Plan Present on Admission:  . Altered mental status .  HYPOTHYROIDISM . HYPERLIPIDEMIA . Hypertension . Cerebrovascular disease . Diabetes mellitus . Chronic kidney disease, stage 3 . Alcohol abuse, in remission  PLAN:  Will admit him for altered mental status, which I suspect is because of the medications and the fact he may have been taking too much of them.  He is breathing OK, and was able to clear his oral airway, but may not be able to eat or drink at this time.  Will give  some IVF and will make him NPO.  I will hold his benzo until he is more alert.  Will check TSH for hypothyroidism.  Because of alcoholic hx, will check G62 level and NH3 level also.  He has been on Metformin, which can cause low B12 level as well.  He is otherwise stable, and will be admitted to Telemetry unit.  SInce he is not eating, will hold his DM2 meds. I will obtain a UDS also.  As per prior arrangement, Dr Bayard Males or coverage will resume his care this am.  Thank you for asking [place] to participate in his care.  Other plans as per orders.  Code Status: FULL CODE.   Orvan Falconer, MD. [facility] Pager [phone] 7pm to 7am.  06/11/2013, 3:40 AM

## 2013-06-11 NOTE — ED Provider Notes (Signed)
CSN: [id number]     Arrival date & time 06/11/13  0022 History  This chart was scribed for Johnna Acosta, MD by Elby Beck, ED Scribe. This patient was seen in room [facility]18/[facility]18 and the patient's care was started at 12:33 AM.   Chief Complaint  Patient presents with  . Drug Overdose    The history is provided by the patient. No language interpreter was used.    HPI Comments: [name] is a 80 y.o. Male with a history of HTN and DM brought by EMS to the Emergency Department with a concern from family that pt has been overdosing his prescribed medications. Pt has no complaints and states that he is not having any symptoms currently.   Per relatives, pt is prescribed 2 mg Xanax TID. He is also prescribed Hydrocodone as well as medications to manage his HTN and DM. Pt lives alone and was visited by his grandson (who is also his neighbor) today who found the pt on the floor naked. Relatives are concerned that pt may have had a fall, resulting in him lying naked on the floor. Relatives were more so concerned because they believe that pt has been taking more medication than he is prescribed. Relative reports that pt's DM and HTN are contained in a pill sorter, and that today they noticed he had taken an extra day's worth of his DM and HTN medications. They believe that this has been an ongoing problem. Relatives also believe that pt has been taking too many Hydrocodone and 5-6 rather than 3 Xanax a day as prescribed. Relatives also report that pt has been forgetful and disoriented to time recently, and that this could be contributing to him taking too many of his medications. Relatives report that pt has episodes of forgetfulness 2-3 times a week. Relatives are also concerned that pt may be dehydrated tonight. Relative reports that pt has tremors at baseline and that he has cognitive decline consistent with dementia. Relative states that pt is able to "wobble" without the assistance of a cane or a  walker. Relative states that pt keeps a gun at his bedside which concerns them. Relatives report that pt has not been complaining of fever, emesis or diarrhea. Relatives deny any seizures on behalf of pt. Pt has a history of cardiac stent placement. Relatives deny any known history of CVA.    Past Medical History  Diagnosis Date  . Hyperlipidemia   . Hypertension   . Hypothyroid   . COPD (chronic obstructive pulmonary disease)   . Anemia   . ASCVD (arteriosclerotic cardiovascular disease)      Inferior myocardial infarction in 08/1998 requiring PCI of the RCA; moderate residual disease in the left anterior descending and first diagonal; ejection fraction of 45%  . Cerebrovascular disease      Right carotid bruit; plaque without stenosis in 2003 and 2005  . Diabetes mellitus     A1c-7.7 in 2004 with diet-controlled; 6.3 and 11/08 on oral medication  . Tobacco abuse     Consumption tapered to 2 packs per week  . Elevated PSA     prior negative prostate biopsy  . Alcohol abuse, in remission   . MI, old    Past Surgical History  Procedure Laterality Date  . Nasal sinus surgery    . Ulnar nerve repair      right arm  . Dental surgery      Right jaw; continued chronic pain  . Colonoscopy  06/2010  Dr. Laural Golden  . Stents      cardiac stent   Family History  Problem Relation Age of Onset  . Hypertension Mother   . Heart disease Father   . Hyperlipidemia Father   . Hypertension Father   . Diabetes Father    History  Substance Use Topics  . Smoking status: Former Smoker -- 1.00 packs/day for 60 years    Types: Cigarettes    Quit date: 04/21/2011  . Smokeless tobacco: Never Used  . Alcohol Use: No     Comment: Former Abuse    Review of Systems  Unable to perform ROS: Mental status change    Allergies  Prednisone  Home Medications   Prior to Admission medications   Medication Sig Start Date End Date Taking? Authorizing Provider  alprazolam Duanne Moron) 2 MG tablet Take 2  mg by mouth 3 (three) times daily as needed for sleep or anxiety.   Yes Historical Provider, MD  aspirin 81 MG EC tablet Take 81 mg by mouth daily.     Yes Historical Provider, MD  cloNIDine (CATAPRES) 0.1 MG tablet Take 0.1 mg by mouth 2 (two) times daily.  09/22/11  Yes Historical Provider, MD  glipiZIDE (GLUCOTROL) 5 MG tablet Take 5 mg by mouth daily before breakfast.   Yes Historical Provider, MD  HYDROcodone-acetaminophen (NORCO) 7.5-325 MG per tablet Take 1 tablet by mouth every 6 (six) hours as needed for pain.   Yes Historical Provider, MD  levothyroxine (SYNTHROID, LEVOTHROID) 112 MCG tablet Take 112 mcg by mouth daily before breakfast.   Yes Historical Provider, MD  metFORMIN (GLUCOPHAGE-XR) 500 MG 24 hr tablet Take 500 mg by mouth daily. 02/05/13  Yes Historical Provider, MD  pravastatin (PRAVACHOL) 40 MG tablet Take 40 mg by mouth at bedtime.     Yes Historical Provider, MD  levofloxacin (LEVAQUIN) 500 MG tablet Take 1 tablet (500 mg total) by mouth every other day. 02/14/13   Nimish Luther Parody, MD  levothyroxine (SYNTHROID, LEVOTHROID) 100 MCG tablet Take by mouth daily.     Historical Provider, MD  losartan-hydrochlorothiazide (HYZAAR) 100-12.5 MG per tablet Take 1 tablet by mouth daily. 02/03/13   Historical Provider, MD  metoprolol tartrate (LOPRESSOR) 25 MG tablet Take 25 mg by mouth 2 (two) times daily.     Historical Provider, MD  nitroGLYCERIN (NITROSTAT) 0.4 MG SL tablet Place 0.4 mg under the tongue every 5 (five) minutes as needed for chest pain.     Historical Provider, MD  TOLBUTamide (ORINASE) 500 MG tablet Take 500 mg by mouth 2 (two) times daily.     Historical Provider, MD   Triage Vitals: BP 115/64  Pulse 77  Temp(Src) 97.4 F (36.3 C) (Oral)  Resp 20  Ht 5\' 6"  (1.676 m)  Wt 224 lb (101.606 kg)  BMI 36.17 kg/m2  SpO2 94%  Physical Exam  Nursing note and vitals reviewed. Constitutional: He is oriented to person, place, and time. He appears well-developed and  well-nourished. No distress.  HENT:  Head: Normocephalic and atraumatic.  Dry mucous membranes  Eyes: Conjunctivae and EOM are normal.  Neck: Normal range of motion. Neck supple.  No meningeal signs  Cardiovascular: Normal rate, regular rhythm and normal heart sounds.  Exam reveals no gallop and no friction rub.   No murmur heard. Occasional ectopy  Pulmonary/Chest: Effort normal and breath sounds normal. No respiratory distress. He has no wheezes. He has no rales. He exhibits no tenderness.  Abdominal: Soft. Bowel sounds are normal. He  exhibits no distension. There is no tenderness. There is no rebound and no guarding.  Musculoskeletal: Normal range of motion. He exhibits no edema and no tenderness.  Neurological: He is alert and oriented to person, place, and time. No cranial nerve deficit.  Overall decreased level of consciousness, very sleepy and somnolent, arouses to loud voice,, moves all extremities x4, falls asleep very quickly, slurs his speech constantly  Skin: Skin is warm and dry. He is not diaphoretic. No erythema.    ED Course  Procedures (including critical care time)  DIAGNOSTIC STUDIES: Oxygen Saturation is 94% on RA, adequate by my interpretation.    COORDINATION OF CARE: 12:36 AM- Discussed plan to obtain a head CT along with diagnostic lab work. Will also order Narcan. Pt advised of plan for treatment and pt agrees.  Labs Review Labs Reviewed  CBC WITH DIFFERENTIAL - Abnormal; Notable for the following:    RBC 4.21 (*)    Hemoglobin 12.1 (*)    HCT 38.4 (*)    RDW 15.7 (*)    Neutro Abs 7.9 (*)    All other components within normal limits  COMPREHENSIVE METABOLIC PANEL - Abnormal; Notable for the following:    Glucose, Bld 150 (*)    BUN 47 (*)    Creatinine, Ser 1.81 (*)    GFR calc non Af Amer 34 (*)    GFR calc Af Amer 40 (*)    All other components within normal limits  URINALYSIS, ROUTINE W REFLEX MICROSCOPIC - Abnormal; Notable for the following:     Specific Gravity, Urine >1.030 (*)    Hgb urine dipstick LARGE (*)    Protein, ur 30 (*)    All other components within normal limits  TROPONIN I  CK  URINE MICROSCOPIC-ADD ON  CBG MONITORING, ED  CBG MONITORING, ED    Imaging Review Ct Head Wo Contrast  06/11/2013   CLINICAL DATA:  severe Headache  EXAM: CT HEAD WITHOUT CONTRAST  TECHNIQUE: Contiguous axial images were obtained from the base of the skull through the vertex without intravenous contrast.  COMPARISON:  Prior CT from 02/12/2013  FINDINGS: Generalized cerebral atrophy with mild chronic microvascular ischemic changes again noted, unchanged.  There is no acute intracranial hemorrhage or infarct. No mass lesion or midline shift. Gray-white matter differentiation is well maintained. Ventricles are normal in size without evidence of hydrocephalus. CSF containing spaces are within normal limits. No extra-axial fluid collection.  The calvarium is intact.  Orbital soft tissues are within normal limits.  Mild polypoid mucosal thickening noted within the inferior right maxillary sinus. Otherwise, the paranasal sinuses and mastoid air cells are well pneumatized and free of fluid.  Scalp soft tissues are unremarkable.  IMPRESSION: Stable appearance of the brain with no acute intracranial abnormality identified.   Electronically Signed   By: Jeannine Boga M.D.   On: 06/11/2013 02:20     EKG Interpretation   Date/Time:  Saturday Jun 11 2013 00:38:42 EDT Ventricular Rate:  76 PR Interval:  192 QRS Duration: 145 QT Interval:  414 QTC Calculation: 465 R Axis:   -56 Text Interpretation:  Sinus rhythm Right bundle branch block Inferolateral  infarct, old since last tracing no significant change Abnormal ekg  Confirmed by Arbaugh  MD, [place] ([id number]) on 06/11/2013 1:56:09 AM      MDM   Final diagnoses:  Altered mental status  Dehydration  Renal failure    The patient appears dehydrated and has an acute encephalopathy, did not  appear to be in distress, his workup includes a CT scan of the head, EKG, labs, urinalysis and cardiac monitoring. There has been no significant arrhythmias, no abnormal vital signs of any concern and no signs of infection. CT scan of the head showed no signs of intracranial injury or stroke, the patient has not had any improvement and did not improve with Narcan. I discussed the patient's care with the hospitalist who will admit the patient the hospital for observation or admission and ongoing monitoring. I suspect this Xanax use and possibly increased clonidine use is the source of the patient's altered mental status.   D/w Dr. Marin Comment who will admit  I personally performed the services described in this documentation, which was scribed in my presence. The recorded information has been reviewed and is accurate.     Johnna Acosta, MD 06/11/13 [phone]

## 2013-06-11 NOTE — ED Notes (Signed)
Pt arrived from home by EMS. Reported pt has been taking too many xanax & hydrocodone. Pt says he is not. Pt awake when he arrived in the ED.

## 2013-06-11 NOTE — Progress Notes (Signed)
[id number] 

## 2013-06-12 LAB — BASIC METABOLIC PANEL
BUN: 26 mg/dL — ABNORMAL HIGH (ref 6–23)
CO2: 29 meq/L (ref 19–32)
CREATININE: 1.26 mg/dL (ref 0.50–1.35)
Calcium: 8.4 mg/dL (ref 8.4–10.5)
Chloride: 104 mEq/L (ref 96–112)
GFR calc Af Amer: 61 mL/min — ABNORMAL LOW (ref >90)
GFR, EST NON AFRICAN AMERICAN: 53 mL/min — AB (ref >90)
Glucose, Bld: 127 mg/dL — ABNORMAL HIGH (ref 70–99)
Potassium: 4.1 mEq/L (ref 3.7–5.3)
SODIUM: 145 meq/L (ref 137–147)

## 2013-06-12 LAB — GLUCOSE, CAPILLARY
GLUCOSE-CAPILLARY: 122 mg/dL — AB (ref 70–99)
Glucose-Capillary: 110 mg/dL — ABNORMAL HIGH (ref 70–99)
Glucose-Capillary: 121 mg/dL — ABNORMAL HIGH (ref 70–99)
Glucose-Capillary: 132 mg/dL — ABNORMAL HIGH (ref 70–99)
Glucose-Capillary: 137 mg/dL — ABNORMAL HIGH (ref 70–99)
Glucose-Capillary: 149 mg/dL — ABNORMAL HIGH (ref 70–99)

## 2013-06-12 NOTE — Progress Notes (Signed)
Utilization review Completed Corde Antonini RN BSN   

## 2013-06-12 NOTE — Progress Notes (Signed)
Restraints ordered but not needed for patient during night shift.

## 2013-06-12 NOTE — Progress Notes (Signed)
[id number] 

## 2013-06-13 LAB — BASIC METABOLIC PANEL
BUN: 19 mg/dL (ref 6–23)
CO2: 29 meq/L (ref 19–32)
CREATININE: 1.22 mg/dL (ref 0.50–1.35)
Calcium: 8.4 mg/dL (ref 8.4–10.5)
Chloride: 103 mEq/L (ref 96–112)
GFR calc Af Amer: 64 mL/min — ABNORMAL LOW (ref >90)
GFR calc non Af Amer: 55 mL/min — ABNORMAL LOW (ref >90)
GLUCOSE: 132 mg/dL — AB (ref 70–99)
Potassium: 4.4 mEq/L (ref 3.7–5.3)
SODIUM: 143 meq/L (ref 137–147)

## 2013-06-13 LAB — GLUCOSE, CAPILLARY
GLUCOSE-CAPILLARY: 103 mg/dL — AB (ref 70–99)
Glucose-Capillary: 127 mg/dL — ABNORMAL HIGH (ref 70–99)
Glucose-Capillary: 134 mg/dL — ABNORMAL HIGH (ref 70–99)
Glucose-Capillary: 99 mg/dL (ref 70–99)
Glucose-Capillary: 123 mg/dL — ABNORMAL HIGH (ref 70–99)

## 2013-06-13 MED ORDER — ALPRAZOLAM 2 MG PO TABS: 1.0000 mg | ORAL_TABLET | Freq: Three times a day (TID) | ORAL | Status: DC

## 2013-06-13 NOTE — Progress Notes (Signed)
NAME:  JUAN, KISSOON NO.:  [id number]  MEDICAL RECORD NO.:  [id number]  LOCATION:                                 FACILITY:  PHYSICIAN:  Unk Lightning, MDDATE OF BIRTH:  14-Nov-1933  DATE OF PROCEDURE:  06/11/2013 DATE OF DISCHARGE:                                PROGRESS NOTE   SUBJECTIVE:  A 80 year old white male, patient of Dr. Anastasio Champion, brought in with altered mental status.  The patient appears quite lethargic. Has respiratory acidosis.  Smoked for many years.  States he quit several years ago.  He is a very poor historian, arousable to verbal and tactile stimuli.  He likewise has a history of hypothyroidism, chronic renal failure.  Creatinine 1.81.  CT of his head was negative for any acute event.  He has hypertension, hyperlipidemia, and anxiety issues. Question in ER is whether he took too much Xanax.  Awaiting urine drug screen at present.  There is no chest x-ray.  We will obtain this.  OBJECTIVE:  LUNGS:  Prolonged inspiratory and expiratory phase.  No rales, wheezes, or rhonchi appreciable. HEART:  Regular rhythm.  No S3, S4.  No heaves, thrills, or rubs. ABDOMEN:  Obese, soft, nontender.  Bowel sounds normoactive.  PLAN:  Right now is to obtain chest x-ray.  Add DuoNeb nebulizer q.4h. for respiratory toilet and stimulation.  Obtain dementia panel, TSH, to look for other reasons of cognitive dysfunction and some lethargy.  We will decrease his Xanax to 1 mg p.o. t.i.d. to see if he responds better and BMET in a.m.     Unk Lightning, MD     RMD/MEDQ  D:  06/11/2013  T:  06/11/2013  Job:  [id number]

## 2013-06-13 NOTE — Progress Notes (Signed)
Pt discharged home today per Dr. Anastasio Champion. Pt's IV site D/C'd, foley removed. Both WDL. Pt's VSS. Discharge instructions and home medication list provided to daughter. Verbalized understanding. Pt left floor via WC in stable condition accompanied by NT.

## 2013-06-13 NOTE — Progress Notes (Signed)
NAME:  [name], [name] NO.:  [id number]  MEDICAL RECORD NO.:  [id number]  LOCATION:  A316                          FACILITY:  [facility]  PHYSICIAN:  Unk Lightning, MDDATE OF BIRTH:  [birth year]  DATE OF PROCEDURE: DATE OF DISCHARGE:                                PROGRESS NOTE   SUBJECTIVE:  A 80 year old white male, lives alone with the grandson living adjacent to him.  The patient came in with lethargy, respiratory insufficiency, and not moving at home, some cognitive decline.  He was taking Xanax 2 mg p.o. t.i.d. and this was stopped and he seemed to perk up.  We gave him aggressive pulmonary toilet in the form of DuoNeb.  His respiratory status improved.  Dementia workup was begun as well as workup for other etiologies of lethargy.  His serum ammonia level was normal.  TSH within normal limits.  B12 and RPR were within normal parameters.  His current dosage of Xanax is 1 mg t.i.d., this is half his normal dose as he is alert but unsteady on his feet.  I basically feel he needs physical therapy strengthening and perhaps some home health care or physical therapy for transition.  He is alert and oriented today.  PHYSICAL EXAMINATION:  VITAL SIGNS:  Blood pressure is 138/78, he is afebrile. LUNGS:  Prolonged inspiratory and expiratory phase.  No rales, wheeze, or rhonchi appreciable. HEART:  Regular rhythm.  No S3, S4 appreciable.  No heaves, thrills, or rubs.  PLAN:  Right now is to continue DuoNeb nebulizer.  Physical Therapy consult for strengthening and ambulation and hoping to limit his benzodiazepine usage or dosage as per his primary physician.     Unk Lightning, MD     RMD/MEDQ  D:  06/12/2013  T:  06/12/2013  Job:  [id number]

## 2013-06-13 NOTE — Evaluation (Signed)
Physical Therapy Evaluation Patient Details Name: [name] MRN: [id number] DOB: 07-24-1933 Today's Date: 06/13/2013   History of Present Illness  [name] is an 80 y.o. male with hx of anxiety, on Xanax 2mg  TID, along with hydrocodone, hx of hypertension, hyperlipidemia, and CVA, recovered alcoholic, hx of hypothyroidism, found by his grandson on the floor naked.  He has been lethargic and confused.  He may have taken his medications incorrectly, as per reflection on his pill box.  He may have taken a little more of his Xanax.  He denied any HA, shortness of breath or chest pain.  Family stated that he has been confused a few times per week, and he lives alone.  In the ER, he was given Narcan without effects.  A CT of his head showed no acute processes.  He has no leukocytosis, and his Cr is 1.8 with normal Na.  His calcium, CPK and LFTs were all normal.  In the ER, he was somnolent but easily arousable.  He is slightly confused, but able to converse.  Hospitalist was asked to admit him for confusion likely due to medication overdose and side effects.    Clinical Impression  Pt presents with dependencies in mobility affecting his Independence. Pt reports chronic back pain limiting his mobility. Pt did desat on RA with activity. Attempted to educate on pursed lip breathing, but pt would not follow instructions. Pt did became agitated after attempt to stand and said he is going to sit back down. Pt reported he will go home today. Unsure of d/c recommendation. I feel the pt is more independent, but he did not demonstrate it to me due to back pain and poor participation. Based on observation I would recommend continued PT if pt is able to participate. Do not recommend d/c home unless he has assistance. Will continue to monitor while an inpatient and recommend nursing staff to assist with ambulation as pt cooperates.    Follow Up Recommendations SNF;Supervision for mobility/OOB;Home health PT     Equipment Recommendations  Rolling walker with 5" wheels    Recommendations for Other Services       Precautions / Restrictions Precautions Precautions: Back;Fall Precaution Comments: educated on log roll technique  Restrictions Weight Bearing Restrictions: No      Mobility  Bed Mobility Overal bed mobility: Needs Assistance Bed Mobility: Sidelying to Sit   Sidelying to sit: Min assist       General bed mobility comments: educated on log roll technique to decrease back pain and strain. Pt needed assistance with hand placement and trunk support to rise up to sitting.  Transfers Overall transfer level: Needs assistance Equipment used: Rolling walker (2 wheeled) Transfers: Sit to/from Stand Sit to Stand: Min assist         General transfer comment: elevated bed, cues for hand placement,   Ambulation/Gait Ambulation/Gait assistance: Min assist Ambulation Distance (Feet): 2 Feet Assistive device: Rolling walker (2 wheeled) Gait Pattern/deviations:  (only did side stepping)     General Gait Details: pt declined gait, pt did agree to side step up to the head of the bed.  Stairs            Wheelchair Mobility    Modified Rankin (Stroke Patients Only)       Balance Overall balance assessment: Needs assistance           Standing balance-Leahy Scale: Fair  Pertinent Vitals/Pain     Home Living Family/patient expects to be discharged to:: Private residence Living Arrangements: Alone (chart reports he lives alone, pt states his son lives at his house.)   Type of Home: [place]: [place]: Environmental consultant - 2 wheels;Cane - single point Additional Comments: pt had difficulty keeping his eyes opened, pt reported he had no equipment and then later said he did. Not sure how reliable his information is regarding home situation and equipment.    Prior Function Level of Independence:  Independent               Hand Dominance        Extremity/Trunk Assessment   Upper Extremity Assessment: Defer to OT evaluation           Lower Extremity Assessment: Overall WFL for tasks assessed         Communication   Communication: No difficulties  Cognition Arousal/Alertness:  (decreased arousal, kept closing his eyes) Behavior During Therapy: Agitated (agitated at times) Overall Cognitive Status: No family/caregiver present to determine baseline cognitive functioning                      General Comments General comments (skin integrity, edema, etc.): Pt got agitated during standing and wanted to sit down immediately. Pt said he will be going home today even though he refused to walk with me.     Exercises        Assessment/Plan    PT Assessment Patient needs continued PT services  PT Diagnosis Difficulty walking   PT Problem List Decreased activity tolerance;Decreased balance;Decreased mobility;Decreased knowledge of use of DME;Decreased safety awareness;Cardiopulmonary status limiting activity  PT Treatment Interventions DME instruction;Gait training;Functional mobility training;Therapeutic activities;Therapeutic exercise;Balance training;Patient/family education   PT Goals (Current goals can be found in the Care Plan section) Acute Rehab PT Goals Patient Stated Goal: to go home today PT Goal Formulation: Patient unable to participate in goal setting Time For Goal Achievement: 06/27/13 Potential to Achieve Goals: Fair    Frequency Min 3X/week   Barriers to discharge Decreased caregiver support      Co-evaluation               End of Session Equipment Utilized During Treatment: Gait belt Activity Tolerance: Treatment limited secondary to agitation;Patient limited by pain Patient left: in bed;with call bell/phone within reach;with nursing/sitter in room Nurse Communication: Mobility status         Time: 1100-1132 PT Time  Calculation (min): 32 min   Charges:   PT Evaluation $Initial PT Evaluation Tier I: 1 Procedure PT Treatments $Therapeutic Activity: 8-22 mins   PT G Codes:          Lelon Mast 06/13/2013, 11:52 AM

## 2013-06-13 NOTE — Discharge Summary (Signed)
Physician Discharge Summary  Patient ID: [name] MRN: [id number] DOB/AGE: 03-19-1933 80 y.o. Primary Care Physician:KNOWLTON,STEPHEN D, MD Admit date: 06/11/2013 Discharge date: 06/13/2013    Discharge Diagnoses:  1. Altered mental status secondary to medication effects and acute renal failure secondary to dehydration. 2. Acute renal failure/dehydration, resolved. 3. Type 2 diabetes mellitus. 4. Hypertension. 5. Hypothyroidism.     Medication List         alprazolam 2 MG tablet  Commonly known as:  XANAX  Take 0.5 tablets (1 mg total) by mouth 3 (three) times daily.     aspirin 81 MG EC tablet  Take 81 mg by mouth daily.     cloNIDine 0.1 MG tablet  Commonly known as:  CATAPRES  Take 0.1 mg by mouth 2 (two) times daily.     glipiZIDE 5 MG tablet  Commonly known as:  GLUCOTROL  Take 5 mg by mouth at bedtime.     HYDROcodone-acetaminophen 7.5-325 MG per tablet  Commonly known as:  NORCO  Take 1 tablet by mouth every 6 (six) hours as needed for pain.     levothyroxine 112 MCG tablet  Commonly known as:  SYNTHROID, LEVOTHROID  Take 112 mcg by mouth daily before breakfast.     metFORMIN 500 MG 24 hr tablet  Commonly known as:  GLUCOPHAGE-XR  Take 500 mg by mouth daily.     nitroGLYCERIN 0.4 MG SL tablet  Commonly known as:  NITROSTAT  Place 0.4 mg under the tongue every 5 (five) minutes as needed for chest pain.     pravastatin 40 MG tablet  Commonly known as:  PRAVACHOL  Take 40 mg by mouth at bedtime.        Discharged Condition: Stable and improved.    Consults: None.  Significant Diagnostic Studies: Dg Chest 1 View  06/11/2013   CLINICAL DATA:  COPD, respiratory insufficiency  EXAM: CHEST - 1 VIEW  COMPARISON:  02/12/2013  FINDINGS: Bilateral diffuse interstitial thickening. There is no focal parenchymal opacity, pleural effusion, or pneumothorax. The heart and mediastinal contours are unremarkable.  The osseous structures are unremarkable.   IMPRESSION: Bilateral diffuse interstitial thickening which likely reflects chronic interstitial lung disease, but superimposed interstitial edema or bronchitis is not excluded.   Electronically Signed   By: Kathreen Devoid   On: 06/11/2013 11:15   Ct Head Wo Contrast  06/11/2013   CLINICAL DATA:  severe Headache  EXAM: CT HEAD WITHOUT CONTRAST  TECHNIQUE: Contiguous axial images were obtained from the base of the skull through the vertex without intravenous contrast.  COMPARISON:  Prior CT from 02/12/2013  FINDINGS: Generalized cerebral atrophy with mild chronic microvascular ischemic changes again noted, unchanged.  There is no acute intracranial hemorrhage or infarct. No mass lesion or midline shift. Gray-white matter differentiation is well maintained. Ventricles are normal in size without evidence of hydrocephalus. CSF containing spaces are within normal limits. No extra-axial fluid collection.  The calvarium is intact.  Orbital soft tissues are within normal limits.  Mild polypoid mucosal thickening noted within the inferior right maxillary sinus. Otherwise, the paranasal sinuses and mastoid air cells are well pneumatized and free of fluid.  Scalp soft tissues are unremarkable.  IMPRESSION: Stable appearance of the brain with no acute intracranial abnormality identified.   Electronically Signed   By: Jeannine Boga M.D.   On: 06/11/2013 02:20    Lab Results: Basic Metabolic Panel:  Recent Labs  06/12/13 0541 06/13/13 0545  NA 145 143  K  4.1 4.4  CL 104 103  CO2 29 29  GLUCOSE 127* 132*  BUN 26* 19  CREATININE 1.26 1.22  CALCIUM 8.4 8.4   Liver Function Tests:  Recent Labs  06/11/13 0101  AST 19  ALT 14  ALKPHOS 53  BILITOT 0.4  PROT 7.6  ALBUMIN 3.7     CBC:  Recent Labs  06/11/13 0101 06/11/13 1039  WBC 10.5 8.8  NEUTROABS 7.9*  --   HGB 12.1* 12.4*  HCT 38.4* 39.1  MCV 91.2 92.2  PLT 241 236    No results found for this or any previous visit (from the past  240 hour(s)).   Hospital Course: This 80 year old man was admitted with symptoms of altered mental status. Please see initial history as outlined below: HPI:  [name] is an 80 y.o. male with hx of anxiety, on Xanax 2mg  TID, along with hydrocodone, hx of hypertension, hyperlipidemia, and CVA, recovered alcoholic, hx of hypothyroidism, found by his grandson on the floor naked. He has been lethargic and confused. He may have taken his medications incorrectly, as per reflection on his pill box. He may have taken a little more of his Xanax. He denied any HA, shortness of breath or chest pain. Family stated that he has been confused a few times per week, and he lives alone. In the ER, he was given Narcan without effects. A CT of his head showed no acute processes. He has no leukocytosis, and his Cr is 1.8 with normal Na. His calcium, CPK and LFTs were all normal. In the ER, he was somnolent but easily arousable. He is slightly confused, but able to converse. Hospitalist was asked to admit him for confusion likely due to medication overdose and side effects.  The patient was given intravenous fluids as he was clinically and biochemically dehydrated and will observe closely. Investigations such as B12, TSH, RPR were all essentially negative. CT brain scan was unremarkable. Fortunately, improved after rehydration and reduction and benzodiazepine dosages. He is now stable for discharge. He was seen by physical therapy who recommended skilled nursing facility for rehabilitation. The patient adamantly refuses this but is willing to have physical therapy at home and this is acceptable to me. He will therefore be discharged with this in mind. His dose of Xanax has been reduced to 1 mg 3 times a day and he will need to continue this at home. Family members will keep a close eye on his medication and he will followup with his primary care physician within the next week or so. Discharge Exam: Blood pressure 117/77,  pulse 67, temperature 98.2 F (36.8 C), temperature source Oral, resp. rate 18, height 5\' 6"  (1.676 m), weight 101.606 kg (224 lb), SpO2 94.00%. He looks systemically well. He is alert and orientated. There are no focal neural signs. Heart sounds are present without murmurs. Lung fields are entirely clear.  Disposition: Home.      Discharge Instructions   Diet - low sodium heart healthy    Complete by:  As directed      Increase activity slowly    Complete by:  As directed              Signed: Talecia Sherlin C Kearie Mennen   06/13/2013, 4:15 PM

## 2013-06-13 NOTE — Care Management Note (Signed)
    Page 1 of 1   06/13/2013     3:53:37 PM CARE MANAGEMENT NOTE 06/13/2013  Patient:  [name], [name]   Account Number:  [id number]  Date Initiated:  06/13/2013  Documentation initiated by:  Claretha Cooper  Subjective/Objective Assessment:   Pt lives alone and his grandson lives next door. Son called CM today regarding DC plans for pt. Since he is not on contact list CM was unable to discuss specifics. Per son, trying to get POA transferred to him and not his sister [name].     Action/Plan:   Will speak to [place] and [place].   Anticipated DC Date:     Anticipated DC Plan:  SKILLED NURSING FACILITY  In-house referral  Clinical Social Worker      DC Planning Services  CM consult      Choice offered to / List presented to:     DME arranged  [facility]      DME agency  [facility] arranged  HH-1 RN  [place].   Status of service:  In process, will continue to follow Medicare Important Message given?   (If response is "NO", the following Medicare IM given date fields will be blank) Date Medicare IM given:   Date Additional Medicare IM given:    Discharge Disposition:    Per UR Regulation:    If discussed at Long Length of Stay Meetings, dates discussed:    Comments:  06/13/13 Claretha Cooper RN CM

## 2013-06-13 NOTE — Clinical Social Work Psychosocial (Signed)
Clinical Social Work Department BRIEF PSYCHOSOCIAL ASSESSMENT 06/13/2013  Patient:  [name], [name]     Account Number:  [id number]     Admit date:  06/11/2013  Clinical Social Worker:  Wyatt Haste  Date/Time:  06/13/2013 03:43 PM  Referred by:  Physician  Date Referred:  06/13/2013 Referred for  SNF Placement   Other Referral:   Interview type:  Patient Other interview type:   step-daughter: [name]    PSYCHOSOCIAL DATA Living Status:  ALONE Admitted from facility:   Level of care:   Primary support name:  [name] Primary support relationship to patient:  CHILD, ADULT Degree of support available:   supportive    CURRENT CONCERNS Current Concerns  Post-Acute Placement   Other Concerns:    SOCIAL WORK ASSESSMENT / PLAN CSW met with pt at bedside. Pt alert and oriented to self and place. Aware it is 9 and spring. Pt laughs at several of his answers and pt's step-daughter, [name] said he tries to be comical. [name] reports that she is HCPOA. She lives in [place] but appears to be involved and supportive. Pt lives alone, but his grandson, [name] lives next door and helps him with things. She reports that [name] found pt on the floor on Friday and they brought him to hospital. Admitted with possible accidental Xanax overdose. [name] states that pt's sister-in-law [name] comes by regularly and puts his medications in his pill box. However, he keeps his Xanax locked up and takes it on his own. [name] said that they feel pt wakes up in the middle of the night and takes some and gets confused about the time. At baseline, pt manages okay at home, although he moves very slowly. He is able to do basic meal prep and still drives, although [name] thinks he shouldn't be any longer. Pt evaluated by PT today and recommendation was for home health vs SNF. CSW discussed options and she is aware of [facility] Medicare authorization process. [name] states that pt would want to be at home and  pt said this multiple times during assessment. Aware that MD would need to make decision on capacity. [name] feels at this point with home health and [name] staying with him for awhile, pt would be fine at home as this would be his desire. CSW requested home health social worker from [facility]. Discussed other options for pills, and suggested that medications be given to pt when due. [name] was also present at end of assessment. They agreed that this would be best.   Assessment/plan status:  Psychosocial Support/Ongoing Assessment of Needs Other assessment/ plan:   Information/referral to community resources:   SNF list  CM for home health/equipment    PATIENT'S/FAMILY'S RESPONSE TO PLAN OF CARE: Pt insists on returning home today. Awaiting MD. Pt's step-daughter would like pt to return home with home health and increased family assistance as pt has been requesting to return home. CSW will sign off, but can be reconsulted if needed.       [name], [name]

## 2013-06-25 ENCOUNTER — Emergency Department (HOSPITAL_COMMUNITY)
Admission: EM | Admit: 2013-06-25 | Discharge: 2013-06-29 | Disposition: A | Discharge status: Home / Self Care | Payer: Medicare Other | Attending: Emergency Medicine | Admitting: Emergency Medicine

## 2013-06-25 ENCOUNTER — Encounter (HOSPITAL_COMMUNITY): Payer: Self-pay | Admitting: Emergency Medicine

## 2013-06-25 DIAGNOSIS — F1011 Alcohol abuse, in remission: Secondary | ICD-10-CM | POA: Insufficient documentation

## 2013-06-25 DIAGNOSIS — F1310 Sedative, hypnotic or anxiolytic abuse, uncomplicated: Secondary | ICD-10-CM

## 2013-06-25 DIAGNOSIS — D649 Anemia, unspecified: Secondary | ICD-10-CM | POA: Insufficient documentation

## 2013-06-25 DIAGNOSIS — T424X4A Poisoning by benzodiazepines, undetermined, initial encounter: Secondary | ICD-10-CM | POA: Insufficient documentation

## 2013-06-25 DIAGNOSIS — I2510 Atherosclerotic heart disease of native coronary artery without angina pectoris: Secondary | ICD-10-CM | POA: Insufficient documentation

## 2013-06-25 DIAGNOSIS — E119 Type 2 diabetes mellitus without complications: Secondary | ICD-10-CM | POA: Insufficient documentation

## 2013-06-25 DIAGNOSIS — Y929 Unspecified place or not applicable: Secondary | ICD-10-CM | POA: Insufficient documentation

## 2013-06-25 DIAGNOSIS — T424X1A Poisoning by benzodiazepines, accidental (unintentional), initial encounter: Secondary | ICD-10-CM | POA: Insufficient documentation

## 2013-06-25 DIAGNOSIS — F3289 Other specified depressive episodes: Secondary | ICD-10-CM

## 2013-06-25 DIAGNOSIS — J4489 Other specified chronic obstructive pulmonary disease: Secondary | ICD-10-CM | POA: Insufficient documentation

## 2013-06-25 DIAGNOSIS — F329 Major depressive disorder, single episode, unspecified: Secondary | ICD-10-CM

## 2013-06-25 DIAGNOSIS — R4182 Altered mental status, unspecified: Secondary | ICD-10-CM

## 2013-06-25 DIAGNOSIS — F1390 Sedative, hypnotic, or anxiolytic use, unspecified, uncomplicated: Secondary | ICD-10-CM

## 2013-06-25 DIAGNOSIS — I252 Old myocardial infarction: Secondary | ICD-10-CM | POA: Insufficient documentation

## 2013-06-25 DIAGNOSIS — I10 Essential (primary) hypertension: Secondary | ICD-10-CM | POA: Insufficient documentation

## 2013-06-25 DIAGNOSIS — Y939 Activity, unspecified: Secondary | ICD-10-CM | POA: Insufficient documentation

## 2013-06-25 DIAGNOSIS — J449 Chronic obstructive pulmonary disease, unspecified: Secondary | ICD-10-CM | POA: Insufficient documentation

## 2013-06-25 DIAGNOSIS — E785 Hyperlipidemia, unspecified: Secondary | ICD-10-CM | POA: Insufficient documentation

## 2013-06-25 DIAGNOSIS — E039 Hypothyroidism, unspecified: Secondary | ICD-10-CM | POA: Insufficient documentation

## 2013-06-25 LAB — BASIC METABOLIC PANEL
BUN: 26 mg/dL — ABNORMAL HIGH (ref 6–23)
CO2: 25 meq/L (ref 19–32)
CREATININE: 1.41 mg/dL — AB (ref 0.50–1.35)
Calcium: 9.4 mg/dL (ref 8.4–10.5)
Chloride: 98 mEq/L (ref 96–112)
GFR calc Af Amer: 54 mL/min — ABNORMAL LOW (ref >90)
GFR calc non Af Amer: 46 mL/min — ABNORMAL LOW (ref >90)
GLUCOSE: 127 mg/dL — AB (ref 70–99)
Potassium: 4.8 mEq/L (ref 3.7–5.3)
Sodium: 139 mEq/L (ref 137–147)

## 2013-06-25 LAB — CBC WITH DIFFERENTIAL/PLATELET
BASOS ABS: 0.1 10*3/uL (ref 0.0–0.1)
Basophils Relative: 1 % (ref 0–1)
EOS PCT: 3 % (ref 0–5)
Eosinophils Absolute: 0.3 10*3/uL (ref 0.0–0.7)
HEMATOCRIT: 44.6 % (ref 39.0–52.0)
HEMOGLOBIN: 14.7 g/dL (ref 13.0–17.0)
LYMPHS PCT: 25 % (ref 12–46)
Lymphs Abs: 2.4 10*3/uL (ref 0.7–4.0)
MCH: 29.5 pg (ref 26.0–34.0)
MCHC: 33 g/dL (ref 30.0–36.0)
MCV: 89.6 fL (ref 78.0–100.0)
MONO ABS: 0.9 10*3/uL (ref 0.1–1.0)
MONOS PCT: 9 % (ref 3–12)
Neutro Abs: 6.1 10*3/uL (ref 1.7–7.7)
Neutrophils Relative %: 62 % (ref 43–77)
Platelets: 341 10*3/uL (ref 150–400)
RBC: 4.98 MIL/uL (ref 4.22–5.81)
RDW: 15.1 % (ref 11.5–15.5)
WBC: 9.7 10*3/uL (ref 4.0–10.5)

## 2013-06-25 LAB — ETHANOL: Alcohol, Ethyl (B): <11 mg/dL (ref 0–11)

## 2013-06-25 LAB — RAPID URINE DRUG SCREEN, HOSP PERFORMED
AMPHETAMINES: NOT DETECTED
Barbiturates: NOT DETECTED
Benzodiazepines: POSITIVE — AB
Cocaine: NOT DETECTED
Opiates: POSITIVE — AB
TETRAHYDROCANNABINOL: NOT DETECTED

## 2013-06-25 NOTE — ED Provider Notes (Signed)
CSN: [id number]     Arrival date & time 06/25/13  1342 History  This chart was scribed for Nat Christen, MD,  by Stacy Gardner, ED Scribe. The patient was seen in room APA17/APA17 and the patient's care was started at 2:48 PM.   First MD Initiated Contact with Patient 06/25/13 1446     Chief Complaint  Patient presents with  . Drug Overdose     (Consider location/radiation/quality/duration/timing/severity/associated sxs/prior Treatment) Patient is a 80 y.o. male presenting with Overdose. The history is provided by the patient and medical records. No language interpreter was used.  Drug Overdose   HPI Comments: Level V caveat for psychiatric illness [name] is a 80 y.o. male who presents to the Emergency Department complaining of drug overdose. Pt is typically takes 2 mg of Xanax in the one and another dose at night. Pt was IVC because he is overdosing on Xanax. He was recently hospitalized for overdosing on Xanax.  Denies SI and HI. Per IVC paperwork, " Respondent is overmedicating himself with prescribed medication plus taking unprescribed medication which can be harmful. Has been in hospital  recently due to overdose of xanax.". Pt is scheduled to see Dr. Nevada Crane next month.     Past Medical History  Diagnosis Date  . Hyperlipidemia   . Hypertension   . Hypothyroid   . COPD (chronic obstructive pulmonary disease)   . Anemia   . ASCVD (arteriosclerotic cardiovascular disease)      Inferior myocardial infarction in 08/1998 requiring PCI of the RCA; moderate residual disease in the left anterior descending and first diagonal; ejection fraction of 45%  . Cerebrovascular disease      Right carotid bruit; plaque without stenosis in 2003 and 2005  . Diabetes mellitus     A1c-7.7 in 2004 with diet-controlled; 6.3 and 11/08 on oral medication  . Tobacco abuse     Consumption tapered to 2 packs per week  . Elevated PSA     prior negative prostate biopsy  . Alcohol abuse, in  remission   . MI, old    Past Surgical History  Procedure Laterality Date  . Nasal sinus surgery    . Ulnar nerve repair      right arm  . Dental surgery      Right jaw; continued chronic pain  . Colonoscopy  06/2010    Dr. Laural Golden  . Stents      cardiac stent   Family History  Problem Relation Age of Onset  . Hypertension Mother   . Heart disease Father   . Hyperlipidemia Father   . Hypertension Father   . Diabetes Father    History  Substance Use Topics  . Smoking status: Former Smoker -- 1.00 packs/day for 60 years    Types: Cigarettes    Quit date: 04/21/2011  . Smokeless tobacco: Never Used  . Alcohol Use: No     Comment: Former Abuse    Review of Systems  Unable to perform ROS: Mental status change  Psychiatric/Behavioral: Negative for suicidal ideas.       No HI      Allergies  Prednisone  Home Medications   Prior to Admission medications   Medication Sig Start Date End Date Taking? Authorizing Provider  alprazolam Duanne Moron) 2 MG tablet Take 0.5 tablets (1 mg total) by mouth 3 (three) times daily. 06/13/13  Yes Nimish Luther Parody, MD  aspirin 81 MG EC tablet Take 81 mg by mouth daily.  Yes Historical Provider, MD  cloNIDine (CATAPRES) 0.1 MG tablet Take 0.1 mg by mouth 2 (two) times daily.  09/22/11  Yes Historical Provider, MD  glipiZIDE (GLUCOTROL) 5 MG tablet Take 5 mg by mouth at bedtime.    Yes Historical Provider, MD  HYDROcodone-acetaminophen (NORCO) 7.5-325 MG per tablet Take 1 tablet by mouth 2 (two) times daily as needed for severe pain.    Yes Historical Provider, MD  levothyroxine (SYNTHROID, LEVOTHROID) 112 MCG tablet Take 112 mcg by mouth daily before breakfast.   Yes Historical Provider, MD  metFORMIN (GLUCOPHAGE-XR) 500 MG 24 hr tablet Take 500 mg by mouth daily. 02/05/13  Yes Historical Provider, MD  nitroGLYCERIN (NITROSTAT) 0.4 MG SL tablet Place 0.4 mg under the tongue every 5 (five) minutes as needed for chest pain.    Yes Historical  Provider, MD  pravastatin (PRAVACHOL) 40 MG tablet Take 40 mg by mouth at bedtime.     Yes Historical Provider, MD   BP 177/72  Pulse 83  Temp(Src) 98.3 F (36.8 C) (Oral)  Resp 20  Ht 6' (1.829 m)  Wt 222 lb (100.699 kg)  BMI 30.10 kg/m2  SpO2 92% Physical Exam  Nursing note and vitals reviewed. Constitutional: He is oriented to person, place, and time. He appears well-developed and well-nourished.  HENT:  Head: Normocephalic and atraumatic.  Eyes: Conjunctivae and EOM are normal. Pupils are equal, round, and reactive to light.  Neck: Normal range of motion. Neck supple.  Cardiovascular: Normal rate, regular rhythm and normal heart sounds.   Pulmonary/Chest: Effort normal and breath sounds normal.  Abdominal: Soft. Bowel sounds are normal.  Musculoskeletal: Normal range of motion.  Neurological: He is alert and oriented to person, place, and time.  Skin: Skin is warm and dry.  Psychiatric: He has a normal mood and affect. His behavior is normal.  Pt is slightly paranoid.  Pressured speech.  Pt is slightly agitated.    ED Course  Procedures (including critical care time) DIAGNOSTIC STUDIES: Oxygen Saturation is 92% on room air, adequate by my interpretation.    COORDINATION OF CARE:  2:54 PM Discussed course of care with pt which includes laboratory tests. Pt understands and agrees.    Labs Review Labs Reviewed  BASIC METABOLIC PANEL - Abnormal; Notable for the following:    Glucose, Bld 127 (*)    BUN 26 (*)    Creatinine, Ser 1.41 (*)    GFR calc non Af Amer 46 (*)    GFR calc Af Amer 54 (*)    All other components within normal limits  URINE RAPID DRUG SCREEN (HOSP PERFORMED) - Abnormal; Notable for the following:    Opiates POSITIVE (*)    Benzodiazepines POSITIVE (*)    All other components within normal limits  CBC WITH DIFFERENTIAL  ETHANOL    Imaging Review No results found.   EKG Interpretation None      MDM   Final diagnoses:   Benzodiazepine misuse   IVC by family member for suspected benzodiazepine abuse.   He is not psychotic.  No S/H ideation.  Consult [facility] for geri-psych placement  I personally performed the services described in this documentation, which was scribed in my presence. The recorded information has been reviewed and is accurate.       Nat Christen, MD 06/25/13 2013

## 2013-06-25 NOTE — BH Assessment (Signed)
Tele Assessment Note   [name] is an 80 y.o. male that was seen via tele assessment by this clinician.  Pt brought to [facility] under IVC by law enforcement after pt's family had pt committed complaining of drug overdose by the pt on Xanax. Pt is typically takes 2 mg of Xanax in the morning and another dose at night. Pt was placed under IVC because he is overdosing on Xanax per the family. He was recently hospitalized for overdosing on Xanax according to family and record in [place] from 06/11/13-06/13/13. Home Health was recommended by the CSW that saw the pt in hospital during this time.  Pt adamantly denies SI/HI/psychosis, stating he takes only the medication that is given to hm in doses by his family. Per IVC paperwork, " Respondent is overmedicating himself with prescribed medication plus taking unprescribed medication which can be harmful. Has been in hospital recently due to overdose of Xanax.".  Pt believes his family is after the money he has saved in his account ($100,000 by report) as well as his land he owns.  Pt lives alone and stated his wife's grandson lives beside him and that he is going to change his will to leave everything to him [name]).  Pt denies psychiatric hx and stated that he is prescribed Xanax due to problems sleeping from his esophagus problems.  Pt was oriented x 4, had logical/coherent speech, made jokes, had good eye contact.  He denies depressive sx or sx of anxiety.  He does still grieve the loss of his wife 3 yrs ago.  Pt did appear suspicious and had some paranoia.  Consulted with Johnathan Agent, NP, who recommended gero psych inpatient treatment for the pt, as this is the second overdose on Xanax @ 1550.  Updated [place] @ [phone], who was in agreement with disposition.  TTS will seek placement for the pt.  Called pt's sister-in-law @ 1430, [name], [phone], to gain collateral information and inform of pt disposition.  She reported she has been giving him his medications, and  that pt has been getting confused.  Per [name], pt reported he went and got Valium off of the street (50 or 60 per the pt) and hid the meds in another pill bottle that the family found in his home this morning.  Apparently, per [name], pt is also prescribed Ativan as well as the Xanax by another doctor.  Pt's family is concerned that he will overdose as badly as he did 2 weeks ago.  Pt was threatening to call the law and pt's family worried and concerned for his safety.  Pt's family requesting to be notified of any changes regarding pt disposition.   Axis I: Mood Disorder NOS Axis II: Deferred Axis III:  Past Medical History  Diagnosis Date  . Hyperlipidemia   . Hypertension   . Hypothyroid   . COPD (chronic obstructive pulmonary disease)   . Anemia   . ASCVD (arteriosclerotic cardiovascular disease)      Inferior myocardial infarction in 08/1998 requiring PCI of the RCA; moderate residual disease in the left anterior descending and first diagonal; ejection fraction of 45%  . Cerebrovascular disease      Right carotid bruit; plaque without stenosis in 2003 and 2005  . Diabetes mellitus     A1c-7.7 in 2004 with diet-controlled; 6.3 and 11/08 on oral medication  . Tobacco abuse     Consumption tapered to 2 packs per week  . Elevated PSA     prior  negative prostate biopsy  . Alcohol abuse, in remission   . MI, old    Axis IV: other psychosocial or environmental problems and problems with primary support group Axis V: 21-30 behavior considerably influenced by delusions or hallucinations OR serious impairment in judgment, communication OR inability to function in almost all areas  Past Medical History:  Past Medical History  Diagnosis Date  . Hyperlipidemia   . Hypertension   . Hypothyroid   . COPD (chronic obstructive pulmonary disease)   . Anemia   . ASCVD (arteriosclerotic cardiovascular disease)      Inferior myocardial infarction in 08/1998 requiring PCI of the RCA; moderate  residual disease in the left anterior descending and first diagonal; ejection fraction of 45%  . Cerebrovascular disease      Right carotid bruit; plaque without stenosis in 2003 and 2005  . Diabetes mellitus     A1c-7.7 in 2004 with diet-controlled; 6.3 and 11/08 on oral medication  . Tobacco abuse     Consumption tapered to 2 packs per week  . Elevated PSA     prior negative prostate biopsy  . Alcohol abuse, in remission   . MI, old     Past Surgical History  Procedure Laterality Date  . Nasal sinus surgery    . Ulnar nerve repair      right arm  . Dental surgery      Right jaw; continued chronic pain  . Colonoscopy  06/2010    Dr. Laural Golden  . Stents      cardiac stent    Family History:  Family History  Problem Relation Age of Onset  . Hypertension Mother   . Heart disease Father   . Hyperlipidemia Father   . Hypertension Father   . Diabetes Father     Social History:  reports that he quit smoking about 2 years ago. His smoking use included Cigarettes. He has a 60 pack-year smoking history. He has never used smokeless tobacco. He reports that he does not drink alcohol or use illicit drugs.  Additional Social History:  Alcohol / Drug Use Pain Medications: see med list Prescriptions: see med list Over the Counter: see med list History of alcohol / drug use?: No history of alcohol / drug abuse Longest period of sobriety (when/how long):  (na) Negative Consequences of Use:  (na) Withdrawal Symptoms:  (na)  CIWA: CIWA-Ar BP: 177/72 mmHg Pulse Rate: 83 COWS:    Allergies:  Allergies  Allergen Reactions  . Prednisone     Doesn't like to take high doses    Home Medications:  (Not in a hospital admission)  OB/GYN Status:  No LMP for male patient.  General Assessment Data Location of Assessment: [facility] Is this a Tele or Face-to-Face Assessment?: Tele Assessment Is this an Initial Assessment or a Re-assessment for this encounter?: Initial Assessment Living  Arrangements: Alone Can pt return to current living arrangement?: Yes Admission Status: Involuntary Is patient capable of signing voluntary admission?: Yes Transfer from: [facility] Referral Source: Self/Family/Friend     [place] Living Arrangements: Alone Name of Psychiatrist: none Name of Therapist: none  Education Status Is patient currently in school?: No  Risk to self Suicidal Ideation: No Suicidal Intent: No Is patient at risk for suicide?: No Suicidal Plan?: No Access to Means: No What has been your use of drugs/alcohol within the last 12 months?: second overdose of Xanax < month Previous Attempts/Gestures: Yes How many times?: 1 (5/30-06/13/13-overdosed on Xanax) Other  Self Harm Risks: pt denies Triggers for Past Attempts: Unknown Intentional Self Injurious Behavior: None Family Suicide History: Unknown Recent stressful life event(s): Other (Comment) (overdose on Xanax) Persecutory voices/beliefs?: No Depression: No Depression Symptoms:  (pt denies) Substance abuse history and/or treatment for substance abuse?: No Suicide prevention information given to non-admitted patients: Not applicable  Risk to Others Homicidal Ideation: No Thoughts of Harm to Others: No Current Homicidal Intent: No Current Homicidal Plan: No Access to Homicidal Means: No Identified Victim: pt denies History of harm to others?: No Assessment of Violence: None Noted Violent Behavior Description: na - pt cooperative Does patient have access to weapons?: No Criminal Charges Pending?: No Does patient have a court date: No  Psychosis Hallucinations: None noted Delusions: None noted  Mental Status Report Appear/Hygiene: Unremarkable Eye Contact: Good Motor Activity: Freedom of movement;Unremarkable Speech: Logical/coherent Level of Consciousness: Alert Mood: Suspicious Affect: Apprehensive Anxiety Level: Moderate Thought Processes: Coherent;Relevant Judgement:  Impaired Orientation: Person;Place;Time;Situation Obsessive Compulsive Thoughts/Behaviors: None  Cognitive Functioning Concentration: Normal Memory: Recent Intact;Remote Intact IQ: Average Insight: Fair Impulse Control: Fair Appetite: Good Weight Loss:  (unknown) Weight Gain:  (unknown) Sleep: No Change Total Hours of Sleep:  (pt stated he sleeps when on the medication) Vegetative Symptoms: None  ADLScreening [facility] Assessment Services) Patient's cognitive ability adequate to safely complete daily activities?: Yes Patient able to express need for assistance with ADLs?: Yes Independently performs ADLs?: No  Prior Inpatient Therapy Prior Inpatient Therapy: No Prior Therapy Dates: na Prior Therapy Facilty/Provider(s): na Reason for Treatment: na  Prior Outpatient Therapy Prior Outpatient Therapy: No Prior Therapy Dates: na Prior Therapy Facilty/Provider(s): na Reason for Treatment: na  ADL Screening (condition at time of admission) Patient's cognitive ability adequate to safely complete daily activities?: Yes Is the patient deaf or have difficulty hearing?: Yes Does the patient have difficulty seeing, even when wearing glasses/contacts?: No Does the patient have difficulty concentrating, remembering, or making decisions?: Yes Patient able to express need for assistance with ADLs?: Yes Does the patient have difficulty dressing or bathing?: No Independently performs ADLs?: No Communication: Independent Dressing (OT): Independent Is this a change from baseline?: Pre-admission baseline Grooming: Independent Is this a change from baseline?: Pre-admission baseline Feeding: Independent Bathing: Independent Is this a change from baseline?: Pre-admission baseline Toileting: Independent Is this a change from baseline?: Pre-admission baseline In/Out Bed: Independent Is this a change from baseline?: Pre-admission baseline Walks in Home: Independent Is this a change from  baseline?: Pre-admission baseline Does the patient have difficulty walking or climbing stairs?: No  Home Assistive Devices/Equipment Home Assistive Devices/Equipment: None    Abuse/Neglect Assessment (Assessment to be complete while patient is alone) Physical Abuse: Denies Verbal Abuse: Denies Sexual Abuse: Denies Exploitation of patient/patient's resources: Denies Self-Neglect: Denies Values / Beliefs Cultural Requests During Hospitalization: None Spiritual Requests During Hospitalization: None Consults Spiritual Care Consult Needed: No Social Work Consult Needed: No Regulatory affairs officer (For Healthcare) Advance Directive: Patient does not have advance directive;Patient would not like information    Additional Information 1:1 In Past 12 Months?: No CIRT Risk: No Elopement Risk: No Does patient have medical clearance?: Yes     Disposition:  Disposition Initial Assessment Completed for this Encounter: Yes Disposition of Patient: Referred to;Inpatient treatment program Type of inpatient treatment program: Adult Patient referred to: Other (Comment) (Pt to be referred to geropsych facility)  Shaune Pascal, [place], [facility] Licensed Professional Counselor Triage Specialist   06/25/2013 4:09 PM

## 2013-06-25 NOTE — BH Assessment (Signed)
[place] Assessment Progress Note  Called EDP Cook @ 1521 to get clinical information for the pt as a tele assessment requested.  Pt's tele assessment appt scheduled with this clinician for 1530.  Updated TTS staff and Pt's nurse in ED.  Shaune Pascal, MS, [facility] Licensed Professional Counselor Triage Specialist

## 2013-06-25 NOTE — ED Notes (Signed)
Pt brought in by [facility] for c/o overdose on xanax; involuntary papers state pt is overdosing himself on xanax; pt was recently hospitalized for overdose of xanax

## 2013-06-25 NOTE — ED Notes (Signed)
Dr Cook in to speak with pt 

## 2013-06-25 NOTE — ED Notes (Signed)
PT denies any SI/HI at this time.

## 2013-06-25 NOTE — ED Notes (Signed)
MD at bedside. 

## 2013-06-25 NOTE — Progress Notes (Signed)
Pt's referral has been faxed to the following gero-psych facilities:  [facility]- per [employer] beds available [facility]- per [name] beds available [facility]- per [facility] beds available [facility]- per Shirlee Limerick fax for review [facility]- per Joycelyn Schmid can fax for review   *The following are at capacity: [facility]- per [facility] no male beds available [facility]- unit temporarily closed   [facility] Disposition [facility]

## 2013-06-25 NOTE — ED Notes (Signed)
BH called and will do asssessment at 1500.  Monitor placed in room and ready.

## 2013-06-26 LAB — URINALYSIS, ROUTINE W REFLEX MICROSCOPIC
Bilirubin Urine: NEGATIVE
Glucose, UA: NEGATIVE mg/dL
Ketones, ur: NEGATIVE mg/dL
LEUKOCYTES UA: NEGATIVE
NITRITE: NEGATIVE
Protein, ur: 100 mg/dL — AB
SPECIFIC GRAVITY, URINE: 1.025 (ref 1.005–1.030)
Urobilinogen, UA: 0.2 mg/dL (ref 0.0–1.0)
pH: 6 (ref 5.0–8.0)

## 2013-06-26 LAB — URINE MICROSCOPIC-ADD ON

## 2013-06-26 LAB — CBG MONITORING, ED: Glucose-Capillary: 115 mg/dL — ABNORMAL HIGH (ref 70–99)

## 2013-06-26 MED ORDER — NITROGLYCERIN 0.4 MG SL SUBL: 0.4000 mg | SUBLINGUAL_TABLET | SUBLINGUAL | Status: DC | PRN

## 2013-06-26 MED ORDER — METFORMIN HCL ER 500 MG PO TB24
500.0000 mg | ORAL_TABLET | Freq: Every day | ORAL | Status: DC
  Administered 2013-06-27 – 2013-06-29 (×3): 500 mg via ORAL
  Filled 2013-06-26 (×6): qty 1

## 2013-06-26 MED ORDER — ASPIRIN EC 81 MG PO TBEC
81.0000 mg | DELAYED_RELEASE_TABLET | Freq: Every day | ORAL | Status: DC
  Administered 2013-06-27 – 2013-06-29 (×3): 81 mg via ORAL
  Filled 2013-06-26 (×6): qty 1

## 2013-06-26 MED ORDER — GLIPIZIDE 5 MG PO TABS
5.0000 mg | ORAL_TABLET | Freq: Every day | ORAL | Status: DC
  Administered 2013-06-27 – 2013-06-28 (×3): 5 mg via ORAL
  Filled 2013-06-26 (×7): qty 1

## 2013-06-26 MED ORDER — GLIPIZIDE 5 MG PO TABS
ORAL_TABLET | ORAL | Status: AC
  Filled 2013-06-26: qty 1

## 2013-06-26 MED ORDER — ALPRAZOLAM 1 MG PO TABS
1.0000 mg | ORAL_TABLET | Freq: Three times a day (TID) | ORAL | Status: DC
  Administered 2013-06-27 – 2013-06-29 (×7): 1 mg via ORAL
  Filled 2013-06-26: qty 1
  Filled 2013-06-26 (×3): qty 2
  Filled 2013-06-26 (×3): qty 1

## 2013-06-26 MED ORDER — SIMVASTATIN 20 MG PO TABS
40.0000 mg | ORAL_TABLET | Freq: Every day | ORAL | Status: DC
  Administered 2013-06-27 – 2013-06-28 (×2): 40 mg via ORAL
  Filled 2013-06-26: qty 2
  Filled 2013-06-26: qty 1
  Filled 2013-06-26 (×2): qty 2

## 2013-06-26 MED ORDER — LEVOTHYROXINE SODIUM 112 MCG PO TABS
112.0000 ug | ORAL_TABLET | Freq: Every day | ORAL | Status: DC
  Administered 2013-06-27 – 2013-06-29 (×3): 112 ug via ORAL
  Filled 2013-06-26 (×6): qty 1

## 2013-06-26 MED ORDER — CLONIDINE HCL 0.1 MG PO TABS
0.1000 mg | ORAL_TABLET | Freq: Two times a day (BID) | ORAL | Status: DC
  Administered 2013-06-26 – 2013-06-29 (×6): 0.1 mg via ORAL
  Filled 2013-06-26 (×6): qty 1

## 2013-06-26 MED ORDER — HYDROCODONE-ACETAMINOPHEN 7.5-325 MG PO TABS
1.0000 | ORAL_TABLET | Freq: Two times a day (BID) | ORAL | Status: DC | PRN
  Administered 2013-06-28: 1 via ORAL
  Filled 2013-06-26: qty 1

## 2013-06-26 NOTE — Progress Notes (Addendum)
Follow-up calls have been placed to the following:  [facility]- per [facility] adolescent beds only [facility]- message left awaiting call back [facility]- per Joycelyn Schmid no referrals will be reviewed until tomorrow d/t their unit acuity [facility]- psychiatrist has not come in to review referrals, will call TTS with updates  *pt has been declined at [facility] per Shirlee Limerick d/t MD feeling primary problem is SA. *pt has been declined at [facility] per [facility] Disposition [facility]

## 2013-06-26 NOTE — ED Notes (Addendum)
Pt is alert and oriented.  Pt was able to tell me all the medications he is on and that he has not taken them today.  He states that his sister in law keeps his medications and gives him out one days worth at a time before she goes to work.  He is upset that he is having to be here and states "I dont know what she wants"  Pt also states that she has his medication bottles and gives out medication to him and he is not taking any more than she gives him.  He states that she tries to control him and speaks with his doctors.  Pt is very alert and seems oriented and makes sense as he is telling me about his situation.

## 2013-06-26 NOTE — ED Notes (Signed)
Tele psych re evaluation, machine set in room for re-evaluation

## 2013-06-26 NOTE — Progress Notes (Signed)
[facility] completed follow ups on the following inpatient facilities:  1)[facility]-review for medical this AM; EKG, chest xray, IVC, 1st examination faxed;Dr order added for UA needed to be faxed 2)[facility]-out of encatchment area 3)[facility]-under review by MD 4)[facility]-will review with MD in AM 5)[facility]-no answer  Wyvonnia Dusky, [facility]/NS

## 2013-06-27 DIAGNOSIS — F1310 Sedative, hypnotic or anxiolytic abuse, uncomplicated: Secondary | ICD-10-CM | POA: Diagnosis not present

## 2013-06-27 DIAGNOSIS — F1910 Other psychoactive substance abuse, uncomplicated: Secondary | ICD-10-CM | POA: Diagnosis not present

## 2013-06-27 LAB — CBG MONITORING, ED: Glucose-Capillary: 132 mg/dL — ABNORMAL HIGH (ref 70–99)

## 2013-06-27 NOTE — ED Notes (Signed)
Attempted to notify patient's daughter [name]) of patient's transfer, unable to get a hold of daughter. Left message telling her to call [facility] for an update. [name]

## 2013-06-27 NOTE — ED Provider Notes (Signed)
Pt pending placement. +IVC. [facility] ED has beds available and ED RN has spoken with staff there to transfer pt. Report to EDP Dr. Leonides Schanz. Will transfer stable.   Alfonzo Feller, DO 06/27/13 1815

## 2013-06-27 NOTE — ED Notes (Signed)
Patient is resting comfortably. 

## 2013-06-27 NOTE — ED Notes (Signed)
Spoke with Tom at [facility], patient to be considered for [facility] for placement. Placement is pending.

## 2013-06-27 NOTE — BH Assessment (Signed)
Patient is pending a telepsych. Writer notified Heloise Purpura, NP of telepsych need. Sts that he will now being doing telepsych's after 12 noon for the ED's as he was directed to do so by Dr. Dwyane Dee. Writer notified patient's nurse-Joanna. Writer will touch base with patient's nurse regarding telepsych time around noon.

## 2013-06-27 NOTE — ED Notes (Signed)
Called to patients room. Pt upset about staying here. Acknowledged patients concerns and was made aware that he can not go anywhere right now. Pt made aware that we are waiting for bed placement. Pt states "if I have to go to another hospital then I need to go home and get some clothes". Pt asked if he wanted to take a shower and that we would give him new scrubs to wear but patient declined.

## 2013-06-27 NOTE — ED Notes (Signed)
Offered patient a shower, patient declines at this time.

## 2013-06-27 NOTE — BH Assessment (Signed)
RE-ASSESSMENT  Pt was initially presented to [facility] ED after being petitioned for IVC by family due to abusing Xanax. Per IVC paperwork "Respondent is overmedicating himself with prescription medication plus taking unprescribed medication which can be harmful. He has recently been hospitalized due to overdose on Xanax." Pt continues to deny that he is abusing his medications and insists that he only take what is given to him by [name], Pt's sister-in-law who assists him. Pt states he doesn't know why his family thinks he is abusing medication other than admitting that he took "a couple of Valium from a friend when I had run out of my Xanax." Pt denies alcohol or other substance abuse. He denies depressive symptoms. He denies current suicidal ideation or history of suicide attempts. He denies homicidal ideation or history of violence. He denies any psychotic symptoms. Pt denies any history of mental health or substance abuse treatment. Pt does state he is still grieving the loss of his wife three years ago.  Pt is dressed in a hospital gown, alert, oriented x4 with normal speech and normal motor behavior. Eye contact is good. Pt's mood is euthymic and affect is congruent with mood. Thought process is coherent and relevant. There is no indication Pt is currently responding to internal stimuli or experiencing delusional thought content. Pt was pleasant and cooperative throughout assessment.  Consulted with Serena Colonel, NP who recommends Pt have a psychiatric consult in the morning. Consult request entered in Psychiatrist Consult Log.   Orpah Greek [facility], [facility] Triage Specialist [phone]

## 2013-06-27 NOTE — BH Assessment (Signed)
[place] Assessment Progress Note  Declined for admission to [facility] at this time due to lack of beds.  Jalene Mullet, MA Triage Specialist 06/27/2013 @ 11:00

## 2013-06-27 NOTE — Consult Note (Signed)
Telepsych Consultation   Reason for Consult:  [name] Peterson is an 80 y.o. male.  Assessment: AXIS I:  Substance Abuse AXIS II:  Deferred AXIS III:   Past Medical History  Diagnosis Date  . Hyperlipidemia   . Hypertension   . Hypothyroid   . COPD (chronic obstructive pulmonary disease)   . Anemia   . ASCVD (arteriosclerotic cardiovascular disease)      Inferior myocardial infarction in 08/1998 requiring PCI of the RCA; moderate residual disease in the left anterior descending and first diagonal; ejection fraction of 45%  . Cerebrovascular disease      Right carotid bruit; plaque without stenosis in 2003 and 2005  . Diabetes mellitus     A1c-7.7 in 2004 with diet-controlled; 6.3 and 11/08 on oral medication  . Tobacco abuse     Consumption tapered to 2 packs per week  . Elevated PSA     prior negative prostate biopsy  . Alcohol abuse, in remission   . MI, old    AXIS IV:  other psychosocial or environmental problems and problems related to social environment AXIS V:  61-70 mild symptoms  Plan:  No evidence of imminent risk to self or others at present.   Patient does not meet criteria for psychiatric inpatient admission. Supportive therapy provided about ongoing stressors. Refer to IOP. Discussed crisis plan, support from social network, calling 911, coming to the Emergency Department, and calling Suicide Hotline.  Subjective:   [name] Peterson is a 80 y.o. male patient admitted with complaints of taking extra [name] as reported by family. However, pt denies this. Pt's affect is consistent with subjective reporting and pt is denying [name] abuse as well as denying SI, HI, and AVH, contracts for safety. Pt reports good family support system. No psychiatric history nor hospitalizations. Pt does not meet criteria for inpatient hospitalization   HPI:  Pt was initially presented to [facility] ED after being petitioned for IVC by family  due to abusing [name]. Per IVC paperwork "Respondent is overmedicating himself with prescription medication plus taking unprescribed medication which can be harmful. He has recently been hospitalized due to overdose on [name]." Pt continues to deny that he is abusing his medications and insists that he only take what is given to him by [name], Pt's sister-in-law who assists him. Pt states he doesn't know why his family thinks he is abusing medication other than admitting that he took "a couple of Valium from a friend when I had run out of my [name]." Pt denies alcohol or other substance abuse. He denies depressive symptoms. He denies current suicidal ideation or history of suicide attempts. He denies homicidal ideation or history of violence. He denies any psychotic symptoms. Pt denies any history of mental health or substance abuse treatment. Pt does state he is still grieving the loss of his wife three years ago.  HPI Elements:   Location:  Generalized, [facility]. Quality:  Stable. Severity:  Moderate. Timing:  Transient. Duration:  Intermittent. Context:  Unknown etiology.  Past Psychiatric History: Past Medical History  Diagnosis Date  . Hyperlipidemia   . Hypertension   . Hypothyroid   . COPD (chronic obstructive pulmonary disease)   . Anemia   . ASCVD (arteriosclerotic cardiovascular disease)      Inferior myocardial infarction in 08/1998 requiring PCI of the RCA; moderate residual disease in the left anterior descending and first diagonal; ejection fraction of 45%  . Cerebrovascular disease  Right carotid bruit; plaque without stenosis in 2003 and 2005  . Diabetes mellitus     A1c-7.7 in 2004 with diet-controlled; 6.3 and 11/08 on oral medication  . Tobacco abuse     Consumption tapered to 2 packs per week  . Elevated PSA     prior negative prostate biopsy  . Alcohol abuse, in remission   . MI, old     reports that he quit smoking about 2 years ago. His smoking use included  Cigarettes. He has a 60 pack-year smoking history. He has never used smokeless tobacco. He reports that he does not drink alcohol or use illicit drugs. Family History  Problem Relation Age of Onset  . Hypertension Mother   . Heart disease Father   . Hyperlipidemia Father   . Hypertension Father   . Diabetes Father    Family History Substance Abuse:  (UTA) Family Supports: Yes, List: (children) Living Arrangements: Alone Can pt return to current living arrangement?: Yes Allergies:   Allergies  Allergen Reactions  . Prednisone     Doesn't like to take high doses    ACT Assessment Complete:  Yes:    Educational Status    Risk to Self: Risk to self Suicidal Ideation: No Suicidal Intent: No Is patient at risk for suicide?: No Suicidal Plan?: No Access to Means: No What has been your use of drugs/alcohol within the last 12 months?: second overdose of [name] < month Previous Attempts/Gestures: Yes How many times?: 1 (5/30-06/13/13-overdosed on [name]) Other Self Harm Risks: pt denies Triggers for Past Attempts: Unknown Intentional Self Injurious Behavior: None Family Suicide History: Unknown Recent stressful life event(s): Other (Comment) (overdose on [name]) Persecutory voices/beliefs?: No Depression: No Depression Symptoms:  (pt denies) Substance abuse history and/or treatment for substance abuse?: Yes Suicide prevention information given to non-admitted patients: Not applicable  Risk to Others: Risk to Others Homicidal Ideation: No Thoughts of Harm to Others: No Current Homicidal Intent: No Current Homicidal Plan: No Access to Homicidal Means: No Identified Victim: pt denies History of harm to others?: No Assessment of Violence: None Noted Violent Behavior Description: na - pt cooperative Does patient have access to weapons?: No Criminal Charges Pending?: No Does patient have a court date: No  Abuse: Abuse/Neglect Assessment (Assessment to be complete while patient is  alone) Physical Abuse: Denies Verbal Abuse: Denies Sexual Abuse: Denies Exploitation of patient/patient's resources: Denies Self-Neglect: Denies  Prior Inpatient Therapy: Prior Inpatient Therapy Prior Inpatient Therapy: No Prior Therapy Dates: na Prior Therapy Facilty/Provider(s): na Reason for Treatment: na  Prior Outpatient Therapy: Prior Outpatient Therapy Prior Outpatient Therapy: No Prior Therapy Dates: na Prior Therapy Facilty/Provider(s): na Reason for Treatment: na  Additional Information: Additional Information 1:1 In Past 12 Months?: No CIRT Risk: No Elopement Risk: No Does patient have medical clearance?: Yes                  Objective: Blood pressure 155/63, pulse 72, temperature 98.1 F (36.7 C), temperature source Oral, resp. rate 16, height 6' (1.829 m), weight 100.699 kg (222 lb), SpO2 93.00%.Body mass index is 30.1 kg/(m^2). Results for orders placed during the hospital encounter of 06/25/13 (from the past 72 hour(s))  URINE RAPID DRUG SCREEN (HOSP PERFORMED)     Status: Abnormal   Collection Time    06/25/13  3:11 PM      Result Value Ref Range   Opiates POSITIVE (*) NONE DETECTED   Cocaine NONE DETECTED  NONE DETECTED   Benzodiazepines POSITIVE (*)  NONE DETECTED   Amphetamines NONE DETECTED  NONE DETECTED   Tetrahydrocannabinol NONE DETECTED  NONE DETECTED   Barbiturates NONE DETECTED  NONE DETECTED   Comment:            DRUG SCREEN FOR MEDICAL PURPOSES     ONLY.  IF CONFIRMATION IS NEEDED     FOR ANY PURPOSE, NOTIFY LAB     WITHIN 5 DAYS.                LOWEST DETECTABLE LIMITS     FOR URINE DRUG SCREEN     Drug Class       Cutoff (ng/mL)     Amphetamine      1000     Barbiturate      200     Benzodiazepine   735     Tricyclics       329     Opiates          300     Cocaine          300     THC              50  CBC WITH DIFFERENTIAL     Status: None   Collection Time    06/25/13  3:25 PM      Result Value Ref Range   WBC 9.7   4.0 - 10.5 K/uL   RBC 4.98  4.22 - 5.81 MIL/uL   Hemoglobin 14.7  13.0 - 17.0 g/dL   HCT 44.6  39.0 - 52.0 %   MCV 89.6  78.0 - 100.0 fL   MCH 29.5  26.0 - 34.0 pg   MCHC 33.0  30.0 - 36.0 g/dL   RDW 15.1  11.5 - 15.5 %   Platelets 341  150 - 400 K/uL   Neutrophils Relative % 62  43 - 77 %   Neutro Abs 6.1  1.7 - 7.7 K/uL   Lymphocytes Relative 25  12 - 46 %   Lymphs Abs 2.4  0.7 - 4.0 K/uL   Monocytes Relative 9  3 - 12 %   Monocytes Absolute 0.9  0.1 - 1.0 K/uL   Eosinophils Relative 3  0 - 5 %   Eosinophils Absolute 0.3  0.0 - 0.7 K/uL   Basophils Relative 1  0 - 1 %   Basophils Absolute 0.1  0.0 - 0.1 K/uL  BASIC METABOLIC PANEL     Status: Abnormal   Collection Time    06/25/13  3:25 PM      Result Value Ref Range   Sodium 139  137 - 147 mEq/L   Potassium 4.8  3.7 - 5.3 mEq/L   Chloride 98  96 - 112 mEq/L   CO2 25  19 - 32 mEq/L   Glucose, Bld 127 (*) 70 - 99 mg/dL   BUN 26 (*) 6 - 23 mg/dL   Creatinine, Ser 1.41 (*) 0.50 - 1.35 mg/dL   Calcium 9.4  8.4 - 10.5 mg/dL   GFR calc non Af Amer 46 (*) >90 mL/min   GFR calc Af Amer 54 (*) >90 mL/min   Comment: (NOTE)     The eGFR has been calculated using the CKD EPI equation.     This calculation has not been validated in all clinical situations.     eGFR's persistently <90 mL/min signify possible Chronic Kidney     Disease.  ETHANOL     Status: None   Collection  Time    06/25/13  3:25 PM      Result Value Ref Range   Alcohol, Ethyl (B) <11  0 - 11 mg/dL   Comment:            LOWEST DETECTABLE LIMIT FOR     SERUM ALCOHOL IS 11 mg/dL     FOR MEDICAL PURPOSES ONLY  URINALYSIS, ROUTINE W REFLEX MICROSCOPIC     Status: Abnormal   Collection Time    06/26/13  9:14 AM      Result Value Ref Range   Color, Urine YELLOW  YELLOW   APPearance CLEAR  CLEAR   Specific Gravity, Urine 1.025  1.005 - 1.030   pH 6.0  5.0 - 8.0   Glucose, UA NEGATIVE  NEGATIVE mg/dL   Hgb urine dipstick TRACE (*) NEGATIVE   Bilirubin Urine  NEGATIVE  NEGATIVE   Ketones, ur NEGATIVE  NEGATIVE mg/dL   Protein, ur 100 (*) NEGATIVE mg/dL   Urobilinogen, UA 0.2  0.0 - 1.0 mg/dL   Nitrite NEGATIVE  NEGATIVE   Leukocytes, UA NEGATIVE  NEGATIVE  URINE MICROSCOPIC-ADD ON     Status: Abnormal   Collection Time    06/26/13  9:14 AM      Result Value Ref Range   Squamous Epithelial / LPF FEW (*) RARE   WBC, UA 0-2  <3 WBC/hpf   RBC / HPF 0-2  <3 RBC/hpf   Bacteria, UA FEW (*) RARE   Casts GRANULAR CAST (*) NEGATIVE  CBG MONITORING, ED     Status: Abnormal   Collection Time    06/26/13 10:34 PM      Result Value Ref Range   Glucose-Capillary 115 (*) 70 - 99 mg/dL   Labs are reviewed and are pertinent for Glucose 115. Bacteria in urine , few.  Current Facility-Administered Medications  Medication Dose Route Frequency Provider Last Rate Last Dose  . ALPRAZolam Duanne Moron) tablet 1 mg  1 mg Oral TID Veryl Speak, MD   1 mg at 06/27/13 1107  . aspirin EC tablet 81 mg  81 mg Oral Daily Veryl Speak, MD   81 mg at 06/27/13 1108  . cloNIDine (CATAPRES) tablet 0.1 mg  0.1 mg Oral BID Veryl Speak, MD   0.1 mg at 06/27/13 1108  . glipiZIDE (GLUCOTROL) tablet 5 mg  5 mg Oral QHS Veryl Speak, MD   5 mg at 06/27/13 0008  . HYDROcodone-acetaminophen (NORCO) 7.5-325 MG per tablet 1 tablet  1 tablet Oral BID PRN Veryl Speak, MD      . levothyroxine (SYNTHROID, LEVOTHROID) tablet 112 mcg  112 mcg Oral QAC breakfast Veryl Speak, MD   112 mcg at 06/27/13 0900  . metFORMIN (GLUCOPHAGE-XR) 24 hr tablet 500 mg  500 mg Oral Q breakfast Veryl Speak, MD   500 mg at 06/27/13 0900  . nitroGLYCERIN (NITROSTAT) SL tablet 0.4 mg  0.4 mg Sublingual Q5 min PRN Veryl Speak, MD      . simvastatin (ZOCOR) tablet 40 mg  40 mg Oral q1800 Veryl Speak, MD       Current Outpatient Prescriptions  Medication Sig Dispense Refill  . alprazolam ([name]) 2 MG tablet Take 0.5 tablets (1 mg total) by mouth 3 (three) times daily.  30 tablet  0  . aspirin 81 MG EC tablet  Take 81 mg by mouth daily.        . cloNIDine (CATAPRES) 0.1 MG tablet Take 0.1 mg by mouth 2 (two) times daily.       Marland Kitchen  glipiZIDE (GLUCOTROL) 5 MG tablet Take 5 mg by mouth at bedtime.       Marland Kitchen HYDROcodone-acetaminophen (NORCO) 7.5-325 MG per tablet Take 1 tablet by mouth 2 (two) times daily as needed for severe pain.       Marland Kitchen levothyroxine (SYNTHROID, LEVOTHROID) 112 MCG tablet Take 112 mcg by mouth daily before breakfast.      . metFORMIN (GLUCOPHAGE-XR) 500 MG 24 hr tablet Take 500 mg by mouth daily.      . nitroGLYCERIN (NITROSTAT) 0.4 MG SL tablet Place 0.4 mg under the tongue every 5 (five) minutes as needed for chest pain.       . pravastatin (PRAVACHOL) 40 MG tablet Take 40 mg by mouth at bedtime.          Psychiatric Specialty Exam:     Blood pressure 155/63, pulse 72, temperature 98.1 F (36.7 C), temperature source Oral, resp. rate 16, height 6' (1.829 m), weight 100.699 kg (222 lb), SpO2 93.00%.Body mass index is 30.1 kg/(m^2).  General Appearance: Casual  Eye Contact::  Good  Speech:  Clear and Coherent  Volume:  Normal  Mood:  Euthymic  Affect:  Appropriate  Thought Process:  Coherent  Orientation:  Full (Time, Place, and Person)  Thought Content:  WDL  Suicidal Thoughts:  No  Homicidal Thoughts:  No  Memory:  Immediate;   Fair Recent;   Fair Remote;   Fair  Judgement:  Fair  Insight:  Fair  Psychomotor Activity:  Normal  Concentration:  Good  Recall:  Fair  Akathisia:  No  Handed:    AIMS (if indicated):     Assets:  Communication Skills Desire for Improvement Resilience  Sleep:      Treatment Plan Summary: -Discharge home with outpatient followup with psychiatry for management and titration of [name] although current regimen may be ideal. -Social work to assist with referrals  Disposition: Disposition Initial Assessment Completed for this Encounter: Yes Disposition of Patient: Referred to;Inpatient treatment program Type of inpatient treatment program:  Adult Patient referred to: Other (Comment) (Pt to be referred to geropsych facility)  Benjamine Mola, FNP-BC 06/27/2013 11:52 AM

## 2013-06-27 NOTE — ED Notes (Signed)
Patient resting comfortably with eyes closed.  Equal rise and fall of chest noted.  Sitter remains with patient.  Will continue to monitor.

## 2013-06-27 NOTE — ED Notes (Signed)
Patient to be transported to [facility] ED to hold while awaiting placement. Patient updated on plan of care and is awaiting transport via [facility].

## 2013-06-27 NOTE — ED Notes (Signed)
Bed: [facility] Expected date: 06/27/13 Expected time:  Means of arrival:  Comments: J.M. Transfer from [facility].

## 2013-06-27 NOTE — ED Notes (Addendum)
Pt daughter, [name], and Pt son, Aldon Hengst, called and asked for update on pt. Pt asked if okay to discuss care plan with daughter and son. Pt verbalized consent. Pt daughter/son given update. Pt children verbalized understanding. Pt daughter contact information: [phone]. Pt son [phone].

## 2013-06-27 NOTE — ED Notes (Signed)
Pt will have telepsych by a psychiatrist today, but it will be after 12pm, per [facility].

## 2013-06-27 NOTE — BH Assessment (Addendum)
[place] Assessment Progress Note  At the request of Hampton Abbot, MD, this writer spoke to pt's family members to obtain collateral information.  At 12:05 I spoke to pt's daughter, [name] ([phone]).  She reports that she also serves as Designer, television/film set.  She reports that pt has had recurrent problems with overuse of his medications, including pain medications and benzodiazepines.  Two weeks ago he was hospitalized for this problem, along with concurrent dehydration.  The EPIC record shows that the admission to [facility] was on 06/11/2013.  His attending physician felt that he was on too many medications, and titrated him off of some of them.  He was discharged on Monday 06/13/2013 with instructions for the family to monitor his safety and for them to manage the administration of his medications.  This is because the pt sleeps throughout the day, and when he awakens he takes whatever medications are on hand without regard for prescription instructions.  The daughter reports that he subsequently tried to obtain medications from his outpatient provider.  When the doctor would not cooperate, pt threatened on several occasions to take his gun and shoot the doctor.  The daughter is uncertain when the last time was that the pt made such a threat.  She reports that the guns have been removed from the home, contributing to the pt's increasing hostility.  She also reports that the pt was recently able to buy 50 tabs of Valium for $100, possibly from her brother (the pt's son).  She found 45 of them in a pill bottle in the pt's refrigerator on the morning of Saturday, 06/25/2013.  The daughter attributes the pt's current problems to the death of his wife about 3 years ago.  At 12:25 I spoke to the pt's sister-in-law, [name], who is also the petitioner for his current IVC.  She corroborates most of the daughter's details.  She reports that the most recent episode of him threatening to shoot his  doctor was probably on 06/22/2013 or 06/23/2013.  She corroborates that the guns have been removed from the home.  She reports that within the last week, when confronted about his medication overuse, he replied, "It's my medicine..I don't care if I live or die."  She is not aware of any other suicidal threats or gestures.  She reports that the events involved with the pt's medical admission to [facility] involved his grandson finding him naked on the floor.  The sister-in-law was called and when she responded, she had to struggle with him as he attempted to ingest more benzodiazepines and pain medications.  EMS was called, but pt refused transport.  Later, the sheriff was called and convinced him to go to the ED by EMS.  The sister-in-law reports that pt is neglecting caring for himself and his household at this time.  After speaking with these family members I spoke to Dr Dwyane Dee.  She recommends pursuing placement at [facility].  I spoke to the pt's nurse subsequently, and she reports that pt is not exhibiting any symptoms of withdrawal from benzodiazepines or opiates at this time.  Jalene Mullet, MA Triage Specialist 06/27/2013 @ 13:33

## 2013-06-28 DIAGNOSIS — F1910 Other psychoactive substance abuse, uncomplicated: Secondary | ICD-10-CM | POA: Diagnosis not present

## 2013-06-28 LAB — CBG MONITORING, ED
Glucose-Capillary: 81 mg/dL (ref 70–99)
Glucose-Capillary: 110 mg/dL — ABNORMAL HIGH (ref 70–99)

## 2013-06-28 NOTE — BH Assessment (Signed)
Pt declined at O/V d/t medical acuity per Elmyra Ricks @1840 .  Shaune Pollack, MS, [place] Assessment Counselor

## 2013-06-28 NOTE — ED Notes (Signed)
All belongingd from locker #41 sent home with grandson.

## 2013-06-28 NOTE — Consult Note (Signed)
  Psychiatric Specialty Exam: Physical Exam  ROS  Blood pressure 138/81, pulse 73, temperature 98.8 F (37.1 C), temperature source Oral, resp. rate 20, height 6' (1.829 m), weight 100.699 kg (222 lb), SpO2 92.00%.Body mass index is 30.1 kg/(m^2).  General Appearance: Disheveled  Eye Contact::  Good  Speech:  Clear and Coherent  Volume:  Normal  Mood:  Irritable  Affect:  Congruent  Thought Process:  Coherent  Orientation:  Full (Time, Place, and Person)  Thought Content:  Negative  Suicidal Thoughts:  No  Homicidal Thoughts:  No  Memory:  Immediate;   Good Recent;   Good Remote;   Good  Judgement:  Poor  Insight:  Lacking  Psychomotor Activity:  Shuffling Gait  Concentration:  Good  Recall:  Good  Akathisia:  Negative  Handed:  Right  AIMS (if indicated):     Assets:  Communication Skills Housing Physical Health Social Support Transportation Vocational/Educational  Sleep:     Mr Cinquemani says there is no reason for being here.  Says his sister-in-law gives him his pills so he does not take extra.  The social worker report indicates serious problems with his taking illicit meds, being obtunded during the day and denying there are any problems.  He has made threats to kill his doctor for not giving him more medications and threats that he did not care whether he lived or died and would take whatever meds he chose to.  The plan is to seek a geriatric psychiatric bed to treat his depression and apparent indifference as to whether he lives or dies as well as taking inappropriate doses of his medication.

## 2013-06-28 NOTE — BH Assessment (Signed)
Spoke with Beth at [facility] who reports that patient's referral information is currently under review.   Shaune Pollack, MS, [place] Assessment Counselor

## 2013-06-28 NOTE — ED Notes (Signed)
Patient arrives to unit via ambulatory escorted by Security. Patient appearance is dishevel, patient has dried feces on his white tee shirt under his paper scrubs. Patient went into restroom and urinated on scrubs while trying to use restroom. Patient has foul body odor.MHT offered to help patient to wash and change into clean scrubs. Patient refused help to wash but changed into clean scrubs. Patient also kept asking if he could put on diabetic shoes patient informed that for safety reason patient was not allowed to wear shoes on the unit but socks provided by the unit. Patient verbalized understanding. Encouragement and support provided and safety maintain.

## 2013-06-28 NOTE — Consult Note (Signed)
  Review of Systems  Constitutional: Negative.   HENT: Negative.   Eyes: Negative.   Respiratory: Negative.   Cardiovascular: Negative.   Gastrointestinal: Negative.   Genitourinary: Negative.   Musculoskeletal: Negative.   Skin: Negative.   Neurological: Negative.   Endo/Heme/Allergies: Negative.   Psychiatric/Behavioral: Positive for substance abuse.   Has no complaints

## 2013-06-28 NOTE — BH Assessment (Signed)
Pt information faxed to [facility]. TTS fax confirmation indicates that fax transmission was complete. Unable to reach staff at [facility] to verify status of referral.  [facility]- Per [name] no bed availability.  Shaune Pollack, MS, [place] Assessment Counselor

## 2013-06-28 NOTE — Progress Notes (Signed)
Pt was given [employer], and explained how to use.

## 2013-06-28 NOTE — Consult Note (Signed)
Patient seen, evaluated by me. This assessment was done by tele psychiatry. Patient denies any suicidal thoughts, does acknowledge that he abuses his Xanax at times. Patient denies any previous psychiatric history. Discussed with patient that collateral information needs to be obtained from family as his family has IVCed Him.

## 2013-06-28 NOTE — Progress Notes (Signed)
  CARE MANAGEMENT ED NOTE 06/28/2013  Patient:  [name], [name]   Account Number:  [id number]  Date Initiated:  06/28/2013  Documentation initiated by:  Jackelyn Poling  Subjective/Objective Assessment:   80 yr old blue medicare covered [place] ) patient brought to [facility] ED from [facility] after arriving with [facility] for drug overdose Positive for Opiates & Benzos Dx Substance abuse     Subjective/Objective Assessment Detail:   Pt previously had 3 [facility] RN visits via [facility] after orders initiated at [facility] in May 201 and will not be able to be seen again by [facility].  06/27/13 TTS/[facility]/counselor note indicates that daughter states pt obtained 50 tabs of Valium for $100, possibly from her brother (the pt's son).  She found 45 of them in a pill bottle in the pt's refrigerator on the morning of Saturday, 06/25/2013  pcp Dr Leslie Andrea     Action/Plan:   ED CM reviewed EPIC notes, CM received a call from [facility] of [facility], ED CM discussed call from [facility] with ED SW along with note from 06/27/13 contents.  Possible d/c plan Inpatient geriatric d/c or d/c home   Action/Plan Detail:   ED CM unable to find resource to assist pt with med management concerns in home if pt preferences are to obtain other medications from his son or other sources or response is " they are my medicines and I can do what I want with them   Anticipated DC Date:       Status Recommendation to Physician:   Result of Recommendation:    Other ED Services  Consult [facility] [place]  Other  PCP issues  Outpatient Services - Pt will follow up    Choice offered to / List presented to:            Status of service:  Completed, signed off  ED Comments:   ED Comments Detail:

## 2013-06-29 ENCOUNTER — Encounter (HOSPITAL_COMMUNITY): Payer: Self-pay | Admitting: Registered Nurse

## 2013-06-29 DIAGNOSIS — F1310 Sedative, hypnotic or anxiolytic abuse, uncomplicated: Secondary | ICD-10-CM

## 2013-06-29 LAB — CBG MONITORING, ED: GLUCOSE-CAPILLARY: 94 mg/dL (ref 70–99)

## 2013-06-29 MED ORDER — ALPRAZOLAM 0.5 MG PO TABS: 0.5000 mg | ORAL_TABLET | Freq: Three times a day (TID) | ORAL | Status: DC | PRN

## 2013-06-29 NOTE — Progress Notes (Signed)
Pt was given list of coping skills and purpose was explained to pt.

## 2013-06-29 NOTE — ED Notes (Signed)
Patient cooperative this morning.  Discharged to home with daughter.  Denies suicidal ideation.  Patient instructed to cut dose of Xanax to .5 mg TID until he was able to follow up with his doctor.

## 2013-06-29 NOTE — Consult Note (Signed)
Face to face evaluation and I agree with this note 

## 2013-06-29 NOTE — Consult Note (Signed)
   Psychiatric Specialty Exam: Physical Exam  ROS  Blood pressure 157/69, pulse 55, temperature 98.1 F (36.7 C), temperature source Oral, resp. rate 18, height 6' (1.829 m), weight 100.699 kg (222 lb), SpO2 98.00%.Body mass index is 30.1 kg/(m^2).  General Appearance: Disheveled  Eye Contact::  Good  Speech:  Clear and Coherent  Volume:  Normal  Mood:  Irritable  Affect:  Congruent  Thought Process:  Coherent  Orientation:  Full (Time, Place, and Person)  Thought Content:  "I'm ready to go; I'm fine"  Suicidal Thoughts:  No  Homicidal Thoughts:  No  Memory:  Immediate;   Good Recent;   Good Remote;   Good  Judgement:  Poor  Insight:  Lacking  Psychomotor Activity:  Shuffling Gait  Concentration:  Good  Recall:  Good  Akathisia:  Negative  Handed:  Right  AIMS (if indicated):     Assets:  Communication Skills Housing Physical Health Social Support Transportation Vocational/Educational  Sleep:      Patient continues to say there is no reason to be here.  States that his sister-in-law takes care of his medication.  Also discussed with patent that he will need to continue tapering his xanax and could follow up with his primary doctor for that.  Patient continues to deny suicidal/homicidal ideation, psychosis, and paranoia.  Disposition:  Will Discharge home today and patient to follow up with his primary physician to continue tapering Xanax.

## 2013-06-29 NOTE — Discharge Instructions (Signed)
Substance Abuse Your exam indicates that you have a problem with substance abuse. Substance abuse is the misuse of alcohol or drugs that causes problems in family life, friendships, and work relationships. Substance abuse is the most important cause of premature illness, disability, and death in our society. It is also the greatest threat to a person's mental and spiritual well being. Substance abuse can start out in an innocent way, such as social drinking or taking a little extra medication prescribed by your doctor. No one starts out with the intention of becoming an alcoholic or an addict. Substance abuse victims cannot control their use of alcohol or drugs. They may become intoxicated daily or go on weekend binges. Often there is a strong desire to quit, but attempts to stop using often fail. Encounters with law enforcement or conflicts with family members, friends, and work associates are signs of a potential problem. Recovery is always possible, although the craving for some drugs makes it difficult to quit without assistance. Many treatment programs are available to help people stop abusing alcohol or drugs. The first step in treatment is to admit you have a problem. This is a major hurdle because denial is a powerful force with substance abuse. Alcoholics Anonymous, Narcotics Anonymous, Cocaine Anonymous, and other recovery groups and programs can be very useful in helping people to quit. If you do not feel okay about your drug or alcohol use and if it is causing you trouble, we want to encourage you to talk about it with your doctor or with someone from a recovery group who can help you. You could also call the [employer] on Drug Abuse at 1-800-662-HELP. It is up to you to take the first step. [facility] and [facility] are support groups for friends and family members of an alcohol or drug dependent person. The people who love and care for the alcoholic or addicted person often need help, too. For  information about these organizations, check your phone directory or call a local alcohol or drug treatment center. Document Released: 02/07/2004 Document Revised: 03/24/2011 Document Reviewed: 01/01/2008 [facility] Patient Information 2015 [place], [place]. This information is not intended to replace advice given to you by your health care provider. Make sure you discuss any questions you have with your health care provider.  Mood Disorders Mood disorders are conditions that affect the way a person feels emotionally. The main mood disorders include:  Depression.  Bipolar disorder.  Dysthymia. Dysthymia is a mild, lasting (chronic) depression. Symptoms of dysthymia are similar to depression, but not as severe.  Cyclothymia. Cyclothymia includes mood swings, but the highs and lows are not as severe as they are in bipolar disorder. Symptoms of cyclothymia are similar to those of bipolar disorder, but less extreme. CAUSES  Mood disorders are probably caused by a combination of factors. People with mood disorders seem to have physical and chemical changes in their brains. Mood disorders run in families, so there may be genetic causes. Severe trauma or stressful life events may also increase the risk of mood disorders.  SYMPTOMS  Symptoms of mood disorders depend on the specific type of condition. Depression symptoms include:  Feeling sad, worthless, or hopeless.  Negative thoughts.  Inability to enjoy one's usual activities.  Low energy.  Sleeping too much or too little.  Appetite changes.  Crying.  Concentration problems.  Thoughts of harming oneself. Bipolar disorder symptoms include:  Periods of depression (see above symptoms).  Mood swings, from sadness and depression, to abnormal elation and  excitement.  Periods of mania:  Racing thoughts.  Fast speech.  Poor judgment, and careless, dangerous choices.  Decreased need for sleep.  Risky behavior.  Difficulty  concentrating.  Irritability.  Increased energy.  Increased sex drive. DIAGNOSIS  There are no blood tests or X-rays that can confirm a mood disorder. However, your caregiver may choose to run some tests to make sure that there is not another physical cause for your symptoms. A mood disorder is usually diagnosed after an in-depth interview with a caregiver. TREATMENT  Mood disorders can be treated with one or more of the following:  Medicine. This may include antidepressants, mood-stabilizers, or anti-psychotics.  Psychotherapy (talk therapy).  Cognitive behavioral therapy. You are taught to recognize negative thoughts and behavior patterns, and replace them with healthy thoughts and behaviors.  Electroconvulsive therapy. For very severe cases of deep depression, a series of treatments in which an electrical current is applied to the brain.  Vagus nerve stimulation. A pulse of electricity is applied to a portion of the brain.  Transcranial magnetic stimulation. Powerful magnets are placed on the head that produce electrical currents.  Hospitalization. In severe situations, or when someone is having serious thoughts of harming him or herself, hospitalization may be necessary in order to keep the person safe. This is also done to quickly start and monitor treatment. HOME CARE INSTRUCTIONS   Take your medicine exactly as directed.  Attend all of your therapy sessions.  Try to eat regular, healthy meals.  Exercise daily. Exercise may improve mood symptoms.  Get good sleep.  Do not drink alcohol or use pot or other drugs. These can worsen mood symptoms and cause anxiety and psychosis.  Tell your caregiver if you develop any side effects, such as feeling sick to your stomach (nauseous), dry mouth, dizziness, constipation, drowsiness, tremor, weight gain, or sexual symptoms. He or she may suggest things you can do to improve symptoms.  Learn ways to cope with the stress of having a  chronic illness. This includes yoga, meditation, tai chi, or participating in a support group.  Drink enough water to keep your urine clear or pale yellow. Eat a high-fiber diet. These habits may help you avoid constipation from your medicine. SEEK IMMEDIATE MEDICAL CARE IF:  Your mood worsens.  You have thoughts of hurting yourself or others.  You cannot care for yourself.  You develop the sensation of hearing or seeing something that is not actually present (auditory or visual hallucinations).  You develop abnormal thoughts. Document Released: 10/27/2008 Document Revised: 03/24/2011 Document Reviewed: 10/27/2008 [facility] Patient Information 2015 [place], [place]. This information is not intended to replace advice given to you by your health care provider. Make sure you discuss any questions you have with your health care provider.

## 2013-06-29 NOTE — Progress Notes (Signed)
Per discussion with psychiatrist and NP, patient psychiatrically stable for dc home. Patient plans to f/u with established outpatient providers.   Noreene Larsson [phone]  ED CSW 06/29/2013 1132am

## 2014-05-24 ENCOUNTER — Encounter (INDEPENDENT_AMBULATORY_CARE_PROVIDER_SITE_OTHER): Payer: Self-pay | Admitting: *Deleted

## 2014-05-26 ENCOUNTER — Emergency Department (HOSPITAL_COMMUNITY)
Admission: EM | Admit: 2014-05-26 | Discharge: 2014-05-26 | Disposition: A | Discharge status: Home / Self Care | Payer: Medicare Other | Attending: Emergency Medicine | Admitting: Emergency Medicine

## 2014-05-26 ENCOUNTER — Encounter (HOSPITAL_COMMUNITY): Payer: Self-pay | Admitting: *Deleted

## 2014-05-26 ENCOUNTER — Emergency Department (HOSPITAL_COMMUNITY): Payer: Medicare Other

## 2014-05-26 DIAGNOSIS — R251 Tremor, unspecified: Secondary | ICD-10-CM | POA: Diagnosis not present

## 2014-05-26 DIAGNOSIS — K5720 Diverticulitis of large intestine with perforation and abscess without bleeding: Secondary | ICD-10-CM | POA: Diagnosis not present

## 2014-05-26 DIAGNOSIS — E785 Hyperlipidemia, unspecified: Secondary | ICD-10-CM | POA: Diagnosis not present

## 2014-05-26 DIAGNOSIS — Z79899 Other long term (current) drug therapy: Secondary | ICD-10-CM | POA: Insufficient documentation

## 2014-05-26 DIAGNOSIS — I252 Old myocardial infarction: Secondary | ICD-10-CM | POA: Diagnosis not present

## 2014-05-26 DIAGNOSIS — Z7982 Long term (current) use of aspirin: Secondary | ICD-10-CM | POA: Diagnosis not present

## 2014-05-26 DIAGNOSIS — Z87891 Personal history of nicotine dependence: Secondary | ICD-10-CM | POA: Diagnosis not present

## 2014-05-26 DIAGNOSIS — I10 Essential (primary) hypertension: Secondary | ICD-10-CM | POA: Insufficient documentation

## 2014-05-26 DIAGNOSIS — R109 Unspecified abdominal pain: Secondary | ICD-10-CM | POA: Diagnosis present

## 2014-05-26 DIAGNOSIS — Z8673 Personal history of transient ischemic attack (TIA), and cerebral infarction without residual deficits: Secondary | ICD-10-CM | POA: Diagnosis not present

## 2014-05-26 DIAGNOSIS — E119 Type 2 diabetes mellitus without complications: Secondary | ICD-10-CM | POA: Insufficient documentation

## 2014-05-26 DIAGNOSIS — Z862 Personal history of diseases of the blood and blood-forming organs and certain disorders involving the immune mechanism: Secondary | ICD-10-CM | POA: Diagnosis not present

## 2014-05-26 DIAGNOSIS — E039 Hypothyroidism, unspecified: Secondary | ICD-10-CM | POA: Diagnosis not present

## 2014-05-26 DIAGNOSIS — J449 Chronic obstructive pulmonary disease, unspecified: Secondary | ICD-10-CM | POA: Insufficient documentation

## 2014-05-26 LAB — COMPREHENSIVE METABOLIC PANEL
ALK PHOS: 42 U/L (ref 38–126)
ALT: 14 U/L — ABNORMAL LOW (ref 17–63)
ANION GAP: 9 (ref 5–15)
AST: 19 U/L (ref 15–41)
Albumin: 3.8 g/dL (ref 3.5–5.0)
BILIRUBIN TOTAL: 0.4 mg/dL (ref 0.3–1.2)
BUN: 56 mg/dL — AB (ref 6–20)
CO2: 32 mmol/L (ref 22–32)
CREATININE: 2.2 mg/dL — AB (ref 0.61–1.24)
Calcium: 9.7 mg/dL (ref 8.9–10.3)
Chloride: 99 mmol/L — ABNORMAL LOW (ref 101–111)
GFR calc Af Amer: 31 mL/min — ABNORMAL LOW (ref >60)
GFR, EST NON AFRICAN AMERICAN: 27 mL/min — AB (ref >60)
Glucose, Bld: 138 mg/dL — ABNORMAL HIGH (ref 65–99)
POTASSIUM: 4.7 mmol/L (ref 3.5–5.1)
SODIUM: 140 mmol/L (ref 135–145)
Total Protein: 7.6 g/dL (ref 6.5–8.1)

## 2014-05-26 LAB — URINE MICROSCOPIC-ADD ON

## 2014-05-26 LAB — URINALYSIS, ROUTINE W REFLEX MICROSCOPIC
Bilirubin Urine: NEGATIVE
GLUCOSE, UA: NEGATIVE mg/dL
Hgb urine dipstick: NEGATIVE
KETONES UR: NEGATIVE mg/dL
Leukocytes, UA: NEGATIVE
Nitrite: NEGATIVE
PH: 7.5 (ref 5.0–8.0)
Protein, ur: 30 mg/dL — AB
Specific Gravity, Urine: 1.015 (ref 1.005–1.030)
Urobilinogen, UA: 0.2 mg/dL (ref 0.0–1.0)

## 2014-05-26 LAB — CBC WITH DIFFERENTIAL/PLATELET
Basophils Absolute: 0 10*3/uL (ref 0.0–0.1)
Basophils Relative: 0 % (ref 0–1)
EOS ABS: 0.4 10*3/uL (ref 0.0–0.7)
Eosinophils Relative: 3 % (ref 0–5)
HEMATOCRIT: 35 % — AB (ref 39.0–52.0)
HEMOGLOBIN: 10.4 g/dL — AB (ref 13.0–17.0)
LYMPHS ABS: 1.9 10*3/uL (ref 0.7–4.0)
Lymphocytes Relative: 16 % (ref 12–46)
MCH: 24.2 pg — ABNORMAL LOW (ref 26.0–34.0)
MCHC: 29.7 g/dL — ABNORMAL LOW (ref 30.0–36.0)
MCV: 81.4 fL (ref 78.0–100.0)
MONOS PCT: 6 % (ref 3–12)
Monocytes Absolute: 0.7 10*3/uL (ref 0.1–1.0)
NEUTROS ABS: 8.7 10*3/uL — AB (ref 1.7–7.7)
NEUTROS PCT: 75 % (ref 43–77)
PLATELETS: 279 10*3/uL (ref 150–400)
RBC: 4.3 MIL/uL (ref 4.22–5.81)
RDW: 20.1 % — AB (ref 11.5–15.5)
WBC: 11.6 10*3/uL — ABNORMAL HIGH (ref 4.0–10.5)

## 2014-05-26 LAB — I-STAT CG4 LACTIC ACID, ED: LACTIC ACID, VENOUS: 1.82 mmol/L (ref 0.5–2.0)

## 2014-05-26 LAB — ETHANOL: ALCOHOL ETHYL (B): 5 mg/dL — AB (ref <5)

## 2014-05-26 LAB — CBG MONITORING, ED: Glucose-Capillary: 115 mg/dL — ABNORMAL HIGH (ref 65–99)

## 2014-05-26 LAB — LIPASE, BLOOD: Lipase: 214 U/L — ABNORMAL HIGH (ref 22–51)

## 2014-05-26 MED ORDER — ALPRAZOLAM 0.5 MG PO TABS
ORAL_TABLET | ORAL | Status: AC
  Filled 2014-05-26: qty 1

## 2014-05-26 MED ORDER — CIPROFLOXACIN HCL 500 MG PO TABS: 500.0000 mg | ORAL_TABLET | Freq: Two times a day (BID) | ORAL | Status: DC

## 2014-05-26 MED ORDER — CIPROFLOXACIN HCL 250 MG PO TABS
500.0000 mg | ORAL_TABLET | Freq: Once | ORAL | Status: AC
  Administered 2014-05-26: 500 mg via ORAL
  Filled 2014-05-26: qty 2

## 2014-05-26 MED ORDER — METRONIDAZOLE 500 MG PO TABS: 500.0000 mg | ORAL_TABLET | Freq: Two times a day (BID) | ORAL | Status: DC

## 2014-05-26 MED ORDER — ALPRAZOLAM 0.5 MG PO TABS
0.2500 mg | ORAL_TABLET | Freq: Once | ORAL | Status: AC
  Administered 2014-05-26: 0.25 mg via ORAL

## 2014-05-26 MED ORDER — METRONIDAZOLE 500 MG PO TABS
500.0000 mg | ORAL_TABLET | Freq: Once | ORAL | Status: AC
  Administered 2014-05-26: 500 mg via ORAL
  Filled 2014-05-26: qty 1

## 2014-05-26 MED ORDER — SODIUM CHLORIDE 0.9 % IV BOLUS (SEPSIS)
1000.0000 mL | Freq: Once | INTRAVENOUS | Status: AC
  Administered 2014-05-26: 1000 mL via INTRAVENOUS

## 2014-05-26 NOTE — Discharge Instructions (Signed)
As discussed, there are several things that she need to follow-up with both your physician and your gastroenterologist to discuss on Monday.  There is evidence for diverticulitis, continued poor kidney function, and pancreas irritation.  Over the weekend, please drink plenty fluids, get plenty of rest, and take all medication as directed.  Though you have elected to go home, to continue therapy, do not hesitate to return here for concerning changes in your condition.

## 2014-05-26 NOTE — ED Notes (Signed)
Abd pain,No NVD, Pt shaking , alert, MAE

## 2014-05-26 NOTE — ED Notes (Signed)
Pt continues to state he is leaving and attempting to get up and get dressed. Sons expressed concern over recent dosage lowering or d/c of xanax, stated that pt has begun shaking and "acting anxious".

## 2014-05-26 NOTE — ED Notes (Signed)
Discharge instructions given, pt and family demonstrated teach back and verbal understanding. No concerns voiced.

## 2014-05-26 NOTE — ED Notes (Signed)
Pt family states that he was supposed to be prescribed home O2 but was noncompliant and refused.

## 2014-05-26 NOTE — ED Provider Notes (Signed)
CSN: [id number]     Arrival date & time 05/26/14  1705 History   First MD Initiated Contact with Patient 05/26/14 1725     Chief Complaint  Patient presents with  . Abdominal Pain     (Consider location/radiation/quality/duration/timing/severity/associated sxs/prior Treatment) HPI Patient presents with concern of ongoing abdominal pain. Pain is inconsistent, seemingly migratory. No definite time of onset, but it seems as though over the past few days in particular the pain has become worse. Patient states the pain is typical for him during episodes of diverticulitis. He denies vomiting or diarrhea, acknowledges shaking. Patient has a history of benzodiazepine dependency, and has recently been tapering its use, but continues to receive daily benzodiazepine. Symptoms are worse with oral intake, no clear alleviating factors. Past Medical History  Diagnosis Date  . Hyperlipidemia   . Hypertension   . Hypothyroid   . COPD (chronic obstructive pulmonary disease)   . Anemia   . ASCVD (arteriosclerotic cardiovascular disease)      Inferior myocardial infarction in 08/1998 requiring PCI of the RCA; moderate residual disease in the left anterior descending and first diagonal; ejection fraction of 45%  . Cerebrovascular disease      Right carotid bruit; plaque without stenosis in 2003 and 2005  . Diabetes mellitus     A1c-7.7 in 2004 with diet-controlled; 6.3 and 11/08 on oral medication  . Tobacco abuse     Consumption tapered to 2 packs per week  . Elevated PSA     prior negative prostate biopsy  . Alcohol abuse, in remission   . MI, old    Past Surgical History  Procedure Laterality Date  . Nasal sinus surgery    . Ulnar nerve repair      right arm  . Dental surgery      Right jaw; continued chronic pain  . Colonoscopy  06/2010    Dr. Laural Golden  . Stents      cardiac stent   Family History  Problem Relation Age of Onset  . Hypertension Mother   . Heart disease Father   .  Hyperlipidemia Father   . Hypertension Father   . Diabetes Father    History  Substance Use Topics  . Smoking status: Former Smoker -- 1.00 packs/day for 60 years    Types: Cigarettes    Quit date: 04/21/2011  . Smokeless tobacco: Never Used  . Alcohol Use: No     Comment: Former Abuse    Review of Systems  Constitutional:       Per HPI, otherwise negative  HENT:       Per HPI, otherwise negative  Respiratory:       Per HPI, otherwise negative  Cardiovascular:       Per HPI, otherwise negative  Gastrointestinal: Positive for abdominal pain. Negative for vomiting.  Endocrine:       Negative aside from HPI  Genitourinary:       Neg aside from HPI   Musculoskeletal:       Per HPI, otherwise negative  Skin: Negative.   Neurological: Positive for weakness. Negative for syncope.      Allergies  Prednisone  Home Medications   Prior to Admission medications   Medication Sig Start Date End Date Taking? Authorizing Provider  ALPRAZolam Duanne Moron) 1 MG tablet Take 1-2 mg by mouth 2 (two) times daily. TAKES ONE TABLET IN THE MORNING AND TWO AT BEDTIME 05/17/14  Yes Historical Provider, MD  aspirin 81 MG EC tablet Take 81  mg by mouth daily.     Yes Historical Provider, MD  cloNIDine (CATAPRES) 0.1 MG tablet Take 0.1 mg by mouth 2 (two) times daily.  09/22/11  Yes Historical Provider, MD  glipiZIDE (GLUCOTROL) 5 MG tablet Take 5 mg by mouth at bedtime.    Yes Historical Provider, MD  levothyroxine (SYNTHROID, LEVOTHROID) 112 MCG tablet Take 112 mcg by mouth daily before breakfast.   Yes Historical Provider, MD  losartan (COZAAR) 50 MG tablet Take 50 mg by mouth daily.  05/09/14  Yes Historical Provider, MD  Melatonin 3 MG CAPS Take 6 mg by mouth at bedtime.   Yes Historical Provider, MD  metFORMIN (GLUCOPHAGE-XR) 500 MG 24 hr tablet Take 500 mg by mouth daily. 02/05/13  Yes Historical Provider, MD  nitroGLYCERIN (NITROSTAT) 0.4 MG SL tablet Place 0.4 mg under the tongue every 5 (five)  minutes as needed for chest pain.    Yes Historical Provider, MD  pravastatin (PRAVACHOL) 40 MG tablet Take 40 mg by mouth at bedtime.     Yes Historical Provider, MD  senna (SENOKOT) 8.6 MG tablet Take 1 tablet by mouth daily as needed for constipation.   Yes Historical Provider, MD  ciprofloxacin (CIPRO) 500 MG tablet Take 1 tablet (500 mg total) by mouth 2 (two) times daily. 05/26/14   Carmin Muskrat, MD  metroNIDAZOLE (FLAGYL) 500 MG tablet Take 1 tablet (500 mg total) by mouth 2 (two) times daily. 05/26/14   Carmin Muskrat, MD   BP 146/58 mmHg  Pulse 87  Temp(Src) 99.2 F (37.3 C) (Oral)  Resp 22  SpO2 90% Physical Exam  Constitutional: He is oriented to person, place, and time. He appears well-developed. No distress.  HENT:  Head: Normocephalic and atraumatic.  Eyes: Conjunctivae and EOM are normal.  Cardiovascular: Normal rate and regular rhythm.   Pulmonary/Chest: Effort normal. No stridor. No respiratory distress.  Abdominal: He exhibits no distension.    Musculoskeletal: He exhibits no edema.  Neurological: He is alert and oriented to person, place, and time.  Mild occasional tremor Speech is difficult to understand, but family states this is normal.  Skin: Skin is warm and dry.  Psychiatric: He has a normal mood and affect.  Nursing note and vitals reviewed.   ED Course  Procedures (including critical care time) Labs Review Labs Reviewed  COMPREHENSIVE METABOLIC PANEL - Abnormal; Notable for the following:    Chloride 99 (*)    Glucose, Bld 138 (*)    BUN 56 (*)    Creatinine, Ser 2.20 (*)    ALT 14 (*)    GFR calc non Af Amer 27 (*)    GFR calc Af Amer 31 (*)    All other components within normal limits  LIPASE, BLOOD - Abnormal; Notable for the following:    Lipase 214 (*)    All other components within normal limits  ETHANOL - Abnormal; Notable for the following:    Alcohol, Ethyl (B) 5 (*)    All other components within normal limits  CBC WITH  DIFFERENTIAL/PLATELET - Abnormal; Notable for the following:    WBC 11.6 (*)    Hemoglobin 10.4 (*)    HCT 35.0 (*)    MCH 24.2 (*)    MCHC 29.7 (*)    RDW 20.1 (*)    Neutro Abs 8.7 (*)    All other components within normal limits  URINALYSIS, ROUTINE W REFLEX MICROSCOPIC - Abnormal; Notable for the following:    Protein, ur 30 (*)  All other components within normal limits  CBG MONITORING, ED - Abnormal; Notable for the following:    Glucose-Capillary 115 (*)    All other components within normal limits  URINE MICROSCOPIC-ADD ON  I-STAT CG4 LACTIC ACID, ED    Imaging Review Ct Abdomen Pelvis Wo Contrast  05/26/2014   CLINICAL DATA:  Generalized abdominal pain.  EXAM: CT ABDOMEN AND PELVIS WITHOUT CONTRAST  TECHNIQUE: Multidetector CT imaging of the abdomen and pelvis was performed following the standard protocol without IV contrast.  COMPARISON:  05/30/2010 and 04/23/2004  FINDINGS: Lung bases are within normal.  Mild stable cardiomegaly.  Abdominal images demonstrate a normal liver, spleen, pancreas, gallbladder and adrenal glands. Kidneys are normal in size with several vascular calcifications near the hilum. There is a 2-3 mm calcification over the mid to upper pole left kidney which may represent a stone versus vascular calcification. No hydronephrosis. There are several simple left renal cyst. There are 2 small sub cm hyperdense left renal cortical foci likely hyperdense cysts. Ureters are within normal. Appendix is not visualized. There is moderate calcified plaque over the abdominal aorta and iliac arteries. There is mild aneurysmal dilatation of the infrarenal abdominal aorta measuring 3 cm in AP diameter without significant change. Mild dilatation of the right common iliac artery measuring 2.3 cm in diameter without significant change. Mild diverticulosis of the colon most prominent over the sigmoid colon.  There is an irregular air collection over the anterior left mid to upper  abdomen adjacent a jejunal loop. This air collection may be extraluminal versus an irregular small bowel diverticulum. There is mild stranding of the adjacent mesenteric fat. This may represent a contained perforation versus acute jejunal diverticulitis. No evidence of bowel obstruction.  Pelvic images demonstrate tiny amount of free fluid. There is smooth linear calcification along the root right posterior dependent portion of the bladder which may represent focal bladder wall calcification.  Evidence of avascular necrosis of the femoral heads bilaterally unchanged. Degenerative changes of the spine and hips.  IMPRESSION: Irregular air collection adjacent a jejunal loop over the left mid to upper abdomen. Mild adjacent inflammation in the mesenteric. This may be due to a contained perforation versus acute jejunal diverticulitis.  Bilateral renal cysts. Two sub cm left renal cortical hyperdensities likely hemorrhagic cyst. Recommend followup CT 6 months.  Diverticulosis of the colon.  3 cm infrarenal abdominal aortic aneurysm without significant change. 2.3 cm right common iliac artery aneurysm. Recommend follow-up ultrasound 3 years. This recommendation follows ACR consensus guidelines: White Paper of the ACR Incidental Findings Committee II on Vascular Findings. J Am Coll Radiol [phone].  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  Critical Value/emergent results were called by telephone at the time of interpretation on 05/26/2014 at 8:46 pm to Dr. Carmin Muskrat , who verbally acknowledged these results.   Electronically Signed   By: Marin Olp M.D.   On: 05/26/2014 20:46    I reviewed the CT imaging, agree with the interpretation. I discussed the findings with our radiologist.  On repeat exam the patient is awake and alert. He and family members are aware of all results. Advocated for admission, IV antibiotics given the concern for possible small abscess versus isolated  perforation. Patient states that he has to go home, has things to do. Patient states he wants to follow-up with his gastroenterologist on Monday. Subsequently I discussed the need to ensure that this occurs, return precautions. We also discussed the patient's elevated lipase, poor renal function  Patient states they will follow-up on all of these issues with his physicians.   MDM   Final diagnoses:  Diverticulitis of large intestine with abscess without bleeding    Patient presents with ongoing abdominal pain.  Patient is tender abdomen, and CT demonstrates diverticulitis. Patient also has several other notable findings, including pancreatitis, poor renal function. However, the patient is adamant about going home, pursuing therapy as an outpatient. Patient has the capacity to make this recommendation, and this was accommodated. She was provided exposer return precautions, care instructions, discharged, per request to follow-up as an outpatient.    Carmin Muskrat, MD 05/26/14 2122

## 2014-06-19 ENCOUNTER — Ambulatory Visit (INDEPENDENT_AMBULATORY_CARE_PROVIDER_SITE_OTHER): Payer: Self-pay | Admitting: Internal Medicine

## 2014-07-10 ENCOUNTER — Other Ambulatory Visit: Payer: Self-pay

## 2014-08-01 ENCOUNTER — Encounter (HOSPITAL_COMMUNITY): Payer: Self-pay | Admitting: Emergency Medicine

## 2014-08-01 ENCOUNTER — Emergency Department (HOSPITAL_COMMUNITY): Payer: Medicare Other

## 2014-08-01 ENCOUNTER — Inpatient Hospital Stay (HOSPITAL_COMMUNITY)
Admission: EM | Admit: 2014-08-01 | Discharge: 2014-08-04 | DRG: 682 | Disposition: A | Discharge status: Skilled Nursing Facility | Payer: Medicare Other | Attending: Internal Medicine | Admitting: Internal Medicine

## 2014-08-01 DIAGNOSIS — I252 Old myocardial infarction: Secondary | ICD-10-CM

## 2014-08-01 DIAGNOSIS — E118 Type 2 diabetes mellitus with unspecified complications: Secondary | ICD-10-CM | POA: Diagnosis present

## 2014-08-01 DIAGNOSIS — J449 Chronic obstructive pulmonary disease, unspecified: Secondary | ICD-10-CM | POA: Diagnosis present

## 2014-08-01 DIAGNOSIS — E785 Hyperlipidemia, unspecified: Secondary | ICD-10-CM | POA: Diagnosis present

## 2014-08-01 DIAGNOSIS — R4182 Altered mental status, unspecified: Secondary | ICD-10-CM | POA: Diagnosis not present

## 2014-08-01 DIAGNOSIS — G309 Alzheimer's disease, unspecified: Secondary | ICD-10-CM | POA: Diagnosis present

## 2014-08-01 DIAGNOSIS — N183 Chronic kidney disease, stage 3 unspecified: Secondary | ICD-10-CM | POA: Diagnosis present

## 2014-08-01 DIAGNOSIS — F05 Delirium due to known physiological condition: Secondary | ICD-10-CM

## 2014-08-01 DIAGNOSIS — F0280 Dementia in other diseases classified elsewhere without behavioral disturbance: Secondary | ICD-10-CM | POA: Diagnosis present

## 2014-08-01 DIAGNOSIS — E039 Hypothyroidism, unspecified: Secondary | ICD-10-CM | POA: Diagnosis present

## 2014-08-01 DIAGNOSIS — Z833 Family history of diabetes mellitus: Secondary | ICD-10-CM

## 2014-08-01 DIAGNOSIS — N179 Acute kidney failure, unspecified: Principal | ICD-10-CM | POA: Diagnosis present

## 2014-08-01 DIAGNOSIS — N390 Urinary tract infection, site not specified: Secondary | ICD-10-CM | POA: Diagnosis present

## 2014-08-01 DIAGNOSIS — E860 Dehydration: Secondary | ICD-10-CM | POA: Diagnosis present

## 2014-08-01 DIAGNOSIS — R740 Nonspecific elevation of levels of transaminase and lactic acid dehydrogenase [LDH]: Secondary | ICD-10-CM

## 2014-08-01 DIAGNOSIS — D638 Anemia in other chronic diseases classified elsewhere: Secondary | ICD-10-CM | POA: Diagnosis present

## 2014-08-01 DIAGNOSIS — G9340 Encephalopathy, unspecified: Secondary | ICD-10-CM | POA: Diagnosis present

## 2014-08-01 DIAGNOSIS — J9612 Chronic respiratory failure with hypercapnia: Secondary | ICD-10-CM

## 2014-08-01 DIAGNOSIS — E872 Acidosis: Secondary | ICD-10-CM | POA: Diagnosis present

## 2014-08-01 DIAGNOSIS — D649 Anemia, unspecified: Secondary | ICD-10-CM | POA: Diagnosis present

## 2014-08-01 DIAGNOSIS — J9690 Respiratory failure, unspecified, unspecified whether with hypoxia or hypercapnia: Secondary | ICD-10-CM | POA: Diagnosis present

## 2014-08-01 DIAGNOSIS — Z87891 Personal history of nicotine dependence: Secondary | ICD-10-CM

## 2014-08-01 DIAGNOSIS — R0902 Hypoxemia: Secondary | ICD-10-CM

## 2014-08-01 DIAGNOSIS — N189 Chronic kidney disease, unspecified: Secondary | ICD-10-CM

## 2014-08-01 DIAGNOSIS — R410 Disorientation, unspecified: Secondary | ICD-10-CM | POA: Diagnosis present

## 2014-08-01 DIAGNOSIS — I129 Hypertensive chronic kidney disease with stage 1 through stage 4 chronic kidney disease, or unspecified chronic kidney disease: Secondary | ICD-10-CM | POA: Diagnosis present

## 2014-08-01 DIAGNOSIS — Z7982 Long term (current) use of aspirin: Secondary | ICD-10-CM

## 2014-08-01 DIAGNOSIS — R7401 Elevation of levels of liver transaminase levels: Secondary | ICD-10-CM

## 2014-08-01 DIAGNOSIS — Z8249 Family history of ischemic heart disease and other diseases of the circulatory system: Secondary | ICD-10-CM

## 2014-08-01 DIAGNOSIS — R339 Retention of urine, unspecified: Secondary | ICD-10-CM | POA: Diagnosis present

## 2014-08-01 HISTORY — DX: Retention of urine, unspecified: R33.9

## 2014-08-01 NOTE — ED Notes (Signed)
Pt made ivc by family due to early alzheimer's disease and being confused. Pt is asleep at this time.

## 2014-08-01 NOTE — ED Notes (Signed)
Family members state they are leaving and want to leave name and number to call if needed. Son's name is Izzy Courville, number [phone], grandson's name is [name], number [phone].
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
## 2014-08-01 NOTE — ED Notes (Signed)
Patient via EMS- IVC by family because patient refuses to seek medical attention. Patient escorted by [facility]. Upon arrival patient is alert to self. Disoriented to place, time, or event.

## 2014-08-01 NOTE — ED Notes (Signed)
Patient's O2 saturation 86% on room air upon arrival to room 18. Placed patient on 2L O2, O2 saturation increased to 90%. Increased O2 to 3L, increased to 94%.

## 2014-08-02 ENCOUNTER — Inpatient Hospital Stay (HOSPITAL_COMMUNITY): Payer: Medicare Other

## 2014-08-02 ENCOUNTER — Encounter (HOSPITAL_COMMUNITY): Payer: Self-pay | Admitting: Internal Medicine

## 2014-08-02 DIAGNOSIS — F0280 Dementia in other diseases classified elsewhere without behavioral disturbance: Secondary | ICD-10-CM | POA: Diagnosis present

## 2014-08-02 DIAGNOSIS — E118 Type 2 diabetes mellitus with unspecified complications: Secondary | ICD-10-CM | POA: Diagnosis present

## 2014-08-02 DIAGNOSIS — N179 Acute kidney failure, unspecified: Secondary | ICD-10-CM | POA: Diagnosis present

## 2014-08-02 DIAGNOSIS — R740 Nonspecific elevation of levels of transaminase and lactic acid dehydrogenase [LDH]: Secondary | ICD-10-CM

## 2014-08-02 DIAGNOSIS — E039 Hypothyroidism, unspecified: Secondary | ICD-10-CM | POA: Diagnosis present

## 2014-08-02 DIAGNOSIS — J449 Chronic obstructive pulmonary disease, unspecified: Secondary | ICD-10-CM | POA: Diagnosis present

## 2014-08-02 DIAGNOSIS — J9690 Respiratory failure, unspecified, unspecified whether with hypoxia or hypercapnia: Secondary | ICD-10-CM | POA: Diagnosis present

## 2014-08-02 DIAGNOSIS — Z8249 Family history of ischemic heart disease and other diseases of the circulatory system: Secondary | ICD-10-CM | POA: Diagnosis not present

## 2014-08-02 DIAGNOSIS — D649 Anemia, unspecified: Secondary | ICD-10-CM | POA: Diagnosis not present

## 2014-08-02 DIAGNOSIS — I252 Old myocardial infarction: Secondary | ICD-10-CM | POA: Diagnosis not present

## 2014-08-02 DIAGNOSIS — Z7982 Long term (current) use of aspirin: Secondary | ICD-10-CM | POA: Diagnosis not present

## 2014-08-02 DIAGNOSIS — R7401 Elevation of levels of liver transaminase levels: Secondary | ICD-10-CM | POA: Diagnosis present

## 2014-08-02 DIAGNOSIS — G9340 Encephalopathy, unspecified: Secondary | ICD-10-CM | POA: Diagnosis present

## 2014-08-02 DIAGNOSIS — E872 Acidosis: Secondary | ICD-10-CM | POA: Diagnosis present

## 2014-08-02 DIAGNOSIS — I129 Hypertensive chronic kidney disease with stage 1 through stage 4 chronic kidney disease, or unspecified chronic kidney disease: Secondary | ICD-10-CM | POA: Diagnosis present

## 2014-08-02 DIAGNOSIS — N390 Urinary tract infection, site not specified: Secondary | ICD-10-CM | POA: Diagnosis present

## 2014-08-02 DIAGNOSIS — R4182 Altered mental status, unspecified: Secondary | ICD-10-CM | POA: Diagnosis present

## 2014-08-02 DIAGNOSIS — R410 Disorientation, unspecified: Secondary | ICD-10-CM | POA: Diagnosis present

## 2014-08-02 DIAGNOSIS — E860 Dehydration: Secondary | ICD-10-CM | POA: Diagnosis present

## 2014-08-02 DIAGNOSIS — Z87891 Personal history of nicotine dependence: Secondary | ICD-10-CM | POA: Diagnosis not present

## 2014-08-02 DIAGNOSIS — R339 Retention of urine, unspecified: Secondary | ICD-10-CM | POA: Diagnosis not present

## 2014-08-02 DIAGNOSIS — D638 Anemia in other chronic diseases classified elsewhere: Secondary | ICD-10-CM | POA: Diagnosis present

## 2014-08-02 DIAGNOSIS — F05 Delirium due to known physiological condition: Secondary | ICD-10-CM

## 2014-08-02 DIAGNOSIS — G309 Alzheimer's disease, unspecified: Secondary | ICD-10-CM | POA: Diagnosis present

## 2014-08-02 DIAGNOSIS — N189 Chronic kidney disease, unspecified: Secondary | ICD-10-CM

## 2014-08-02 DIAGNOSIS — E1129 Type 2 diabetes mellitus with other diabetic kidney complication: Secondary | ICD-10-CM

## 2014-08-02 DIAGNOSIS — Z833 Family history of diabetes mellitus: Secondary | ICD-10-CM | POA: Diagnosis not present

## 2014-08-02 DIAGNOSIS — E785 Hyperlipidemia, unspecified: Secondary | ICD-10-CM | POA: Diagnosis present

## 2014-08-02 DIAGNOSIS — N183 Chronic kidney disease, stage 3 (moderate): Secondary | ICD-10-CM | POA: Diagnosis present

## 2014-08-02 LAB — HEPATIC FUNCTION PANEL
ALBUMIN: 3.5 g/dL (ref 3.5–5.0)
ALK PHOS: 53 U/L (ref 38–126)
ALT: 47 U/L (ref 17–63)
AST: 324 U/L — AB (ref 15–41)
Bilirubin, Direct: 0.1 mg/dL (ref 0.1–0.5)
Indirect Bilirubin: 0.5 mg/dL (ref 0.3–0.9)
Total Bilirubin: 0.6 mg/dL (ref 0.3–1.2)
Total Protein: 7.5 g/dL (ref 6.5–8.1)

## 2014-08-02 LAB — CBC WITH DIFFERENTIAL/PLATELET
Basophils Absolute: 0 10*3/uL (ref 0.0–0.1)
Basophils Relative: 0 % (ref 0–1)
Eosinophils Absolute: 0 10*3/uL (ref 0.0–0.7)
Eosinophils Relative: 0 % (ref 0–5)
HEMATOCRIT: 37.4 % — AB (ref 39.0–52.0)
Hemoglobin: 11.9 g/dL — ABNORMAL LOW (ref 13.0–17.0)
Lymphocytes Relative: 16 % (ref 12–46)
Lymphs Abs: 2.2 10*3/uL (ref 0.7–4.0)
MCH: 25.8 pg — AB (ref 26.0–34.0)
MCHC: 31.8 g/dL (ref 30.0–36.0)
MCV: 81.1 fL (ref 78.0–100.0)
MONO ABS: 1.5 10*3/uL — AB (ref 0.1–1.0)
MONOS PCT: 11 % (ref 3–12)
NEUTROS ABS: 10.3 10*3/uL — AB (ref 1.7–7.7)
NEUTROS PCT: 73 % (ref 43–77)
PLATELETS: 240 10*3/uL (ref 150–400)
RBC: 4.61 MIL/uL (ref 4.22–5.81)
RDW: 18.8 % — ABNORMAL HIGH (ref 11.5–15.5)
WBC: 14.1 10*3/uL — ABNORMAL HIGH (ref 4.0–10.5)

## 2014-08-02 LAB — URINALYSIS, ROUTINE W REFLEX MICROSCOPIC
Glucose, UA: NEGATIVE mg/dL
KETONES UR: NEGATIVE mg/dL
Leukocytes, UA: NEGATIVE
Nitrite: NEGATIVE
PH: 5.5 (ref 5.0–8.0)
Protein, ur: 100 mg/dL — AB
Specific Gravity, Urine: >1.03 — ABNORMAL HIGH (ref 1.005–1.030)
Urobilinogen, UA: 0.2 mg/dL (ref 0.0–1.0)

## 2014-08-02 LAB — BLOOD GAS, ARTERIAL
Bicarbonate: 26 mEq/L — ABNORMAL HIGH (ref 20.0–24.0)
Drawn by: 317771
O2 CONTENT: 3 L/min
O2 Saturation: 96.5 %
PO2 ART: 100 mmHg (ref 80.0–100.0)
TCO2: 23.9 mmol/L (ref 0–100)
pCO2 arterial: 49.3 mmHg — ABNORMAL HIGH (ref 35.0–45.0)
pH, Arterial: 7.342 — ABNORMAL LOW (ref 7.350–7.450)

## 2014-08-02 LAB — BASIC METABOLIC PANEL
Anion gap: 14 (ref 5–15)
BUN: 74 mg/dL — ABNORMAL HIGH (ref 6–20)
CALCIUM: 10.9 mg/dL — AB (ref 8.9–10.3)
CHLORIDE: 101 mmol/L (ref 101–111)
CO2: 26 mmol/L (ref 22–32)
CREATININE: 4.85 mg/dL — AB (ref 0.61–1.24)
GFR calc Af Amer: 12 mL/min — ABNORMAL LOW (ref >60)
GFR, EST NON AFRICAN AMERICAN: 10 mL/min — AB (ref >60)
Glucose, Bld: 167 mg/dL — ABNORMAL HIGH (ref 65–99)
Potassium: 4.1 mmol/L (ref 3.5–5.1)
SODIUM: 141 mmol/L (ref 135–145)

## 2014-08-02 LAB — RAPID URINE DRUG SCREEN, HOSP PERFORMED
AMPHETAMINES: NOT DETECTED
Barbiturates: NOT DETECTED
Benzodiazepines: POSITIVE — AB
Cocaine: NOT DETECTED
OPIATES: NOT DETECTED
Tetrahydrocannabinol: NOT DETECTED

## 2014-08-02 LAB — COMPREHENSIVE METABOLIC PANEL
ALBUMIN: 3.2 g/dL — AB (ref 3.5–5.0)
ALT: 55 U/L (ref 17–63)
AST: 327 U/L — AB (ref 15–41)
Alkaline Phosphatase: 50 U/L (ref 38–126)
Anion gap: 12 (ref 5–15)
BUN: 77 mg/dL — ABNORMAL HIGH (ref 6–20)
CO2: 27 mmol/L (ref 22–32)
CREATININE: 5.04 mg/dL — AB (ref 0.61–1.24)
Calcium: 10 mg/dL (ref 8.9–10.3)
Chloride: 105 mmol/L (ref 101–111)
GFR calc Af Amer: 11 mL/min — ABNORMAL LOW (ref >60)
GFR calc non Af Amer: 10 mL/min — ABNORMAL LOW (ref >60)
GLUCOSE: 134 mg/dL — AB (ref 65–99)
Potassium: 4.3 mmol/L (ref 3.5–5.1)
SODIUM: 144 mmol/L (ref 135–145)
Total Bilirubin: 0.5 mg/dL (ref 0.3–1.2)
Total Protein: 6.9 g/dL (ref 6.5–8.1)

## 2014-08-02 LAB — CBC
HCT: 35.2 % — ABNORMAL LOW (ref 39.0–52.0)
Hemoglobin: 10.9 g/dL — ABNORMAL LOW (ref 13.0–17.0)
MCH: 25.4 pg — ABNORMAL LOW (ref 26.0–34.0)
MCHC: 31 g/dL (ref 30.0–36.0)
MCV: 82.1 fL (ref 78.0–100.0)
Platelets: 255 10*3/uL (ref 150–400)
RBC: 4.29 MIL/uL (ref 4.22–5.81)
RDW: 19.2 % — ABNORMAL HIGH (ref 11.5–15.5)
WBC: 14.8 10*3/uL — ABNORMAL HIGH (ref 4.0–10.5)

## 2014-08-02 LAB — GLUCOSE, CAPILLARY
GLUCOSE-CAPILLARY: 131 mg/dL — AB (ref 65–99)
Glucose-Capillary: 107 mg/dL — ABNORMAL HIGH (ref 65–99)
Glucose-Capillary: 104 mg/dL — ABNORMAL HIGH (ref 65–99)
Glucose-Capillary: 98 mg/dL (ref 65–99)

## 2014-08-02 LAB — URINE MICROSCOPIC-ADD ON

## 2014-08-02 LAB — VITAMIN B12: VITAMIN B 12: 346 pg/mL (ref 180–914)

## 2014-08-02 LAB — ETHANOL: Alcohol, Ethyl (B): <5 mg/dL (ref <5)

## 2014-08-02 MED ORDER — SODIUM CHLORIDE 0.9 % IV SOLN
1000.0000 mL | INTRAVENOUS | Status: DC
  Administered 2014-08-02 – 2014-08-03 (×2): 1000 mL via INTRAVENOUS

## 2014-08-02 MED ORDER — ACETAMINOPHEN 650 MG RE SUPP: 650.0000 mg | Freq: Four times a day (QID) | RECTAL | Status: DC | PRN

## 2014-08-02 MED ORDER — LEVOTHYROXINE SODIUM 112 MCG PO TABS
112.0000 ug | ORAL_TABLET | Freq: Every day | ORAL | Status: DC
  Administered 2014-08-03 – 2014-08-04 (×2): 112 ug via ORAL
  Filled 2014-08-02 (×2): qty 1

## 2014-08-02 MED ORDER — ALPRAZOLAM 1 MG PO TABS: 1.0000 mg | ORAL_TABLET | Freq: Every day | ORAL | Status: DC

## 2014-08-02 MED ORDER — SODIUM CHLORIDE 0.9 % IJ SOLN
3.0000 mL | Freq: Two times a day (BID) | INTRAMUSCULAR | Status: DC
  Administered 2014-08-02 – 2014-08-03 (×2): 3 mL via INTRAVENOUS

## 2014-08-02 MED ORDER — ALPRAZOLAM 1 MG PO TABS
1.0000 mg | ORAL_TABLET | Freq: Two times a day (BID) | ORAL | Status: DC
  Administered 2014-08-02 – 2014-08-04 (×5): 1 mg via ORAL
  Filled 2014-08-02 (×6): qty 1

## 2014-08-02 MED ORDER — ALPRAZOLAM 1 MG PO TABS
1.0000 mg | ORAL_TABLET | Freq: Every day | ORAL | Status: DC
  Administered 2014-08-02 – 2014-08-03 (×2): 1 mg via ORAL
  Filled 2014-08-02 (×2): qty 1

## 2014-08-02 MED ORDER — INSULIN ASPART 100 UNIT/ML ~~LOC~~ SOLN
0.0000 [IU] | Freq: Three times a day (TID) | SUBCUTANEOUS | Status: DC
  Administered 2014-08-02 – 2014-08-03 (×2): 1 [IU] via SUBCUTANEOUS
  Administered 2014-08-04 (×2): 2 [IU] via SUBCUTANEOUS

## 2014-08-02 MED ORDER — DEXTROSE 5 % IV SOLN
1.0000 g | INTRAVENOUS | Status: DC
  Administered 2014-08-03 – 2014-08-04 (×2): 1 g via INTRAVENOUS
  Filled 2014-08-02 (×5): qty 10

## 2014-08-02 MED ORDER — MEMANTINE HCL 10 MG PO TABS
5.0000 mg | ORAL_TABLET | Freq: Two times a day (BID) | ORAL | Status: DC
  Administered 2014-08-02 – 2014-08-04 (×5): 5 mg via ORAL
  Filled 2014-08-02 (×5): qty 1

## 2014-08-02 MED ORDER — HEPARIN SODIUM (PORCINE) 5000 UNIT/ML IJ SOLN
5000.0000 [IU] | Freq: Three times a day (TID) | INTRAMUSCULAR | Status: DC
  Administered 2014-08-02 – 2014-08-04 (×8): 5000 [IU] via SUBCUTANEOUS
  Filled 2014-08-02 (×7): qty 1

## 2014-08-02 MED ORDER — CEFTRIAXONE SODIUM IN DEXTROSE 20 MG/ML IV SOLN
1.0000 g | INTRAVENOUS | Status: DC
  Filled 2014-08-02: qty 50

## 2014-08-02 MED ORDER — CETYLPYRIDINIUM CHLORIDE 0.05 % MT LIQD
7.0000 mL | Freq: Two times a day (BID) | OROMUCOSAL | Status: DC
  Administered 2014-08-02 – 2014-08-04 (×5): 7 mL via OROMUCOSAL

## 2014-08-02 MED ORDER — CEFTRIAXONE SODIUM 1 G IJ SOLR
1.0000 g | Freq: Once | INTRAMUSCULAR | Status: AC
  Administered 2014-08-02: 1 g via INTRAVENOUS
  Filled 2014-08-02: qty 10

## 2014-08-02 MED ORDER — CALCIUM CARBONATE ANTACID 500 MG PO CHEW
1.0000 | CHEWABLE_TABLET | Freq: Three times a day (TID) | ORAL | Status: DC
  Administered 2014-08-02 – 2014-08-04 (×6): 200 mg via ORAL
  Filled 2014-08-02 (×6): qty 1

## 2014-08-02 MED ORDER — ALPRAZOLAM 1 MG PO TABS: 1.0000 mg | ORAL_TABLET | Freq: Two times a day (BID) | ORAL | Status: DC

## 2014-08-02 MED ORDER — INSULIN ASPART 100 UNIT/ML ~~LOC~~ SOLN: 0.0000 [IU] | Freq: Every day | SUBCUTANEOUS | Status: DC

## 2014-08-02 MED ORDER — SODIUM CHLORIDE 0.9 % IV SOLN
1000.0000 mL | Freq: Once | INTRAVENOUS | Status: AC
  Administered 2014-08-02: 1000 mL via INTRAVENOUS

## 2014-08-02 MED ORDER — ACETAMINOPHEN 325 MG PO TABS: 650.0000 mg | ORAL_TABLET | Freq: Four times a day (QID) | ORAL | Status: DC | PRN

## 2014-08-02 MED ORDER — SODIUM CHLORIDE 0.9 % IV SOLN
INTRAVENOUS | Status: DC
Start: 2014-08-02 — End: 2014-08-02
  Administered 2014-08-02: 03:00:00 via INTRAVENOUS

## 2014-08-02 MED ORDER — ALPRAZOLAM 1 MG PO TABS: 2.0000 mg | ORAL_TABLET | Freq: Every day | ORAL | Status: DC
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
## 2014-08-02 NOTE — Plan of Care (Signed)
Problem: Acute Rehab PT Goals(only PT should resolve) Goal: Pt Will Go Supine/Side To Sit Pt will demonstrate supervision bed mobility supine to sitting edge-of-bed to return to PLOF and to decrease caregiver burden.     Goal: Patient Will Transfer Sit To/From Stand Pt will transfer sit to/from-stand with RW at ModI 5x without loss-of-balance to demonstrate good safety awareness for independent mobility in home.     Goal: Pt Will Ambulate Pt will ambulate with RW at [place] a step-through pattern and equal step length for a distances greater than 14ft to demonstrate the ability to perform safe household distance ambulation at discharge.

## 2014-08-02 NOTE — Progress Notes (Signed)
Patient is a 81 year old man with a history of CAD, Alzheimer's dementia, COPD, hypothyroidism, and diabetes mellitus. He was admitted by Dr. Maudie Mercury this morning for altered mental status. The patient was seen and briefly examined. His chart, vital signs, and laboratory studies were reviewed. On exam, the patient is sleeping, but he becomes arousable. His lungs are clear and his heart reveals S1, S2 with no ectopy. He is oriented to himself, hospital, and PCP by name, Dr. Karie Kirks.  We'll continue management as started with additions below.  -We will check strict I's and O's. Will add a condom cath or insert Foley catheter. -Consider nephrology consult if the patient's renal function does not improve with IV fluids and supportive treatment. -We'll check a TSH and vitamin B12 level. -Will start Rocephin empirically for possible urinary tract infection. Urine cultures pending. -We'll start a full liquid diet. -We'll order PT consultation.
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
## 2014-08-02 NOTE — H&P (Signed)
[name] is an 81 y.o. male.     Chief Complaint: acute renal failure HPI: 81 y.o. male brought in by ambulance, who presents to the Emergency Department complaining of AMS. Pt was sent here under IVC by family, because of worsening alzheimer's disease and the family's inability to manage him at home.  Pt was found to be in ARF due to dehydration to have hypercalcemia.  Pt is unable to give history due to dementia.   Past Medical History  Diagnosis Date  . Hyperlipidemia   . Hypertension   . Hypothyroid   . COPD (chronic obstructive pulmonary disease)   . Anemia   . ASCVD (arteriosclerotic cardiovascular disease)      Inferior myocardial infarction in 08/1998 requiring PCI of the RCA; moderate residual disease in the left anterior descending and first diagonal; ejection fraction of 45%  . Cerebrovascular disease      Right carotid bruit; plaque without stenosis in 2003 and 2005  . Diabetes mellitus     A1c-7.7 in 2004 with diet-controlled; 6.3 and 11/08 on oral medication  . Tobacco abuse     Consumption tapered to 2 packs per week  . Elevated PSA     prior negative prostate biopsy  . Alcohol abuse, in remission   . MI, old     Past Surgical History  Procedure Laterality Date  . Nasal sinus surgery    . Ulnar nerve repair      right arm  . Dental surgery      Right jaw; continued chronic pain  . Colonoscopy  06/2010    Dr. Laural Golden  . Stents      cardiac stent    Family History  Problem Relation Age of Onset  . Hypertension Mother   . Heart disease Father   . Hyperlipidemia Father   . Hypertension Father   . Diabetes Father    Social History:  reports that he quit smoking about 3 years ago. His smoking use included Cigarettes. He has a 60 pack-year smoking history. He has never used smokeless tobacco. He reports that he does not drink alcohol or use illicit drugs.  Allergies:  Allergies  Allergen Reactions  . Prednisone     Doesn't like to take high doses   Medications reviewed  Results for orders placed or performed during the hospital encounter of 08/01/14 (from the past 48 hour(s))  CBC with Differential     Status: Abnormal   Collection Time: 08/01/14 11:34 PM  Result Value Ref Range   WBC 14.1 (H) 4.0 - 10.5 K/uL   RBC 4.61 4.22 - 5.81 MIL/uL   Hemoglobin 11.9 (L) 13.0 - 17.0 g/dL   HCT 37.4 (L) 39.0 - 52.0 %   MCV 81.1 78.0 - 100.0 fL   MCH 25.8 (L) 26.0 - 34.0 pg   MCHC 31.8 30.0 - 36.0 g/dL   RDW 18.8 (H) 11.5 - 15.5 %   Platelets 240 150 - 400 K/uL   Neutrophils Relative % 73 43 - 77 %   Neutro Abs 10.3 (H) 1.7 - 7.7 K/uL   Lymphocytes Relative 16 12 - 46 %   Lymphs Abs 2.2 0.7 - 4.0 K/uL   Monocytes Relative 11 3 - 12 %   Monocytes Absolute 1.5 (H) 0.1 - 1.0 K/uL   Eosinophils Relative 0 0 - 5 %   Eosinophils Absolute 0.0 0.0 - 0.7 K/uL   Basophils Relative 0 0 - 1 %   Basophils Absolute 0.0 0.0 -  0.1 K/uL  Basic metabolic panel     Status: Abnormal   Collection Time: 08/01/14 11:34 PM  Result Value Ref Range   Sodium 141 135 - 145 mmol/L   Potassium 4.1 3.5 - 5.1 mmol/L   Chloride 101 101 - 111 mmol/L   CO2 26 22 - 32 mmol/L   Glucose, Bld 167 (H) 65 - 99 mg/dL   BUN 74 (H) 6 - 20 mg/dL   Creatinine, Ser 4.85 (H) 0.61 - 1.24 mg/dL   Calcium 10.9 (H) 8.9 - 10.3 mg/dL   GFR calc non Af Amer 10 (L) >60 mL/min   GFR calc Af Amer 12 (L) >60 mL/min    Comment: (NOTE) The eGFR has been calculated using the CKD EPI equation. This calculation has not been validated in all clinical situations. eGFR's persistently <60 mL/min signify possible Chronic Kidney Disease.    Anion gap 14 5 - 15  Ethanol     Status: None   Collection Time: 08/01/14 11:34 PM  Result Value Ref Range   Alcohol, Ethyl (B) <5 <5 mg/dL    Comment:        LOWEST DETECTABLE LIMIT FOR SERUM ALCOHOL IS 5 mg/dL FOR MEDICAL PURPOSES ONLY   Hepatic function panel     Status: Abnormal   Collection Time: 08/01/14 11:34 PM  Result Value Ref Range    Total Protein 7.5 6.5 - 8.1 g/dL   Albumin 3.5 3.5 - 5.0 g/dL   AST 324 (H) 15 - 41 U/L   ALT 47 17 - 63 U/L   Alkaline Phosphatase 53 38 - 126 U/L   Total Bilirubin 0.6 0.3 - 1.2 mg/dL   Bilirubin, Direct 0.1 0.1 - 0.5 mg/dL   Indirect Bilirubin 0.5 0.3 - 0.9 mg/dL  Blood gas, arterial     Status: Abnormal   Collection Time: 08/01/14 11:50 PM  Result Value Ref Range   O2 Content 3.0 L/min   Delivery systems NASAL CANNULA    pH, Arterial 7.342 (L) 7.350 - 7.450   pCO2 arterial 49.3 (H) 35.0 - 45.0 mmHg   pO2, Arterial 100 80.0 - 100.0 mmHg   Bicarbonate 26.0 (H) 20.0 - 24.0 mEq/L   TCO2 23.9 0 - 100 mmol/L   O2 Saturation 96.5 %   Collection site RIGHT RADIAL    Drawn by [id number]    Sample type ARTERIAL DRAW    Allens test (pass/fail) PASS PASS  Urine rapid drug screen (hosp performed)     Status: Abnormal   Collection Time: 08/02/14  1:23 AM  Result Value Ref Range   Opiates NONE DETECTED NONE DETECTED   Cocaine NONE DETECTED NONE DETECTED   Benzodiazepines POSITIVE (A) NONE DETECTED   Amphetamines NONE DETECTED NONE DETECTED   Tetrahydrocannabinol NONE DETECTED NONE DETECTED   Barbiturates NONE DETECTED NONE DETECTED    Comment:        DRUG SCREEN FOR MEDICAL PURPOSES ONLY.  IF CONFIRMATION IS NEEDED FOR ANY PURPOSE, NOTIFY LAB WITHIN 5 DAYS.        LOWEST DETECTABLE LIMITS FOR URINE DRUG SCREEN Drug Class       Cutoff (ng/mL) Amphetamine      1000 Barbiturate      200 Benzodiazepine   200 Tricyclics       300 Opiates          300 Cocaine          300 THC                50   Urinalysis, Routine w reflex microscopic (not at [facility])     Status: Abnormal   Collection Time: 08/02/14  1:23 AM  Result Value Ref Range   Color, Urine BROWN (A) YELLOW    Comment: BIOCHEMICALS MAY BE AFFECTED BY COLOR   APPearance CLEAR CLEAR   Specific Gravity, Urine >1.030 (H) 1.005 - 1.030   pH 5.5 5.0 - 8.0   Glucose, UA NEGATIVE NEGATIVE mg/dL   Hgb urine dipstick LARGE (A) NEGATIVE    Bilirubin Urine SMALL (A) NEGATIVE   Ketones, ur NEGATIVE NEGATIVE mg/dL   Protein, ur 100 (A) NEGATIVE mg/dL   Urobilinogen, UA 0.2 0.0 - 1.0 mg/dL   Nitrite NEGATIVE NEGATIVE   Leukocytes, UA NEGATIVE NEGATIVE  Urine microscopic-add on     Status: Abnormal   Collection Time: 08/02/14  1:23 AM  Result Value Ref Range   Squamous Epithelial / LPF FEW (A) RARE   WBC, UA 0-2 <3 WBC/hpf   RBC / HPF 0-2 <3 RBC/hpf   Bacteria, UA MANY (A) RARE   Urine-Other HEMOSIDERIN GRANULES    Dg Chest 1 View  08/02/2014   CLINICAL DATA:  81-year-old male with cough  EXAM: CHEST  1 VIEW  COMPARISON:  Radiograph dated 06/11/2013  FINDINGS: Single-view of the chest demonstrate interval improvement in the aeration of the lungs with decreased interstitial prominence. There is no focal consolidation, pleural effusion, or pneumothorax. Stable cardiac silhouette. Coronary vascular stent noted. The osseous structures are grossly unremarkable.  IMPRESSION: No active disease.   Electronically Signed   By: Arash  Radparvar M.D.   On: 08/02/2014 00:37   Ct Head Wo Contrast  08/02/2014   CLINICAL DATA:  Acute onset of altered mental status. Initial encounter.  EXAM: CT HEAD WITHOUT CONTRAST  TECHNIQUE: Contiguous axial images were obtained from the base of the skull through the vertex without intravenous contrast.  COMPARISON:  CT of the head performed 06/11/2013  FINDINGS: There is no evidence of acute infarction, mass lesion, or intra- or extra-axial hemorrhage on CT.  Prominence of the ventricles and sulci reflects mild to moderate cortical volume loss. Cerebellar atrophy is noted. Scattered periventricular and subcortical white matter change likely reflects small vessel ischemic microangiopathy. A chronic lacunar infarct is noted at the left basal ganglia.  The brainstem and fourth ventricle are within normal limits. The cerebral hemispheres demonstrate grossly normal gray-white differentiation. No mass effect or midline  shift is seen.  There is no evidence of fracture; visualized osseous structures are unremarkable in appearance. The visualized portions of the orbits are within normal limits. Small mucus retention cysts or polyps are noted at the right maxillary sinus. The remaining paranasal sinuses and mastoid air cells are well-aerated. No significant soft tissue abnormalities are seen.  IMPRESSION: 1. No acute intracranial pathology seen on CT. 2. Mild to moderate cortical volume loss and scattered small vessel ischemic microangiopathy. 3. Chronic lacunar infarct at the left basal ganglia. 4. Small mucus retention cyst or polyp at the right maxillary sinus.   Electronically Signed   By: Jeffery  Chang M.D.   On: 08/02/2014 01:03    Review of Systems  Constitutional: Negative.   HENT: Negative.   Eyes: Negative.   Respiratory: Negative.   Cardiovascular: Negative.   Gastrointestinal: Negative.   Genitourinary: Negative.   Musculoskeletal: Negative.   Skin: Negative.   Neurological: Negative.   Endo/Heme/Allergies: Negative.   Psychiatric/Behavioral: Negative.     Blood pressure 99/55, pulse 79, temperature 98.2 F (36.8   C), temperature source Oral, resp. rate 24, height 6' 1" (1.854 m), weight 104.327 kg (230 lb), SpO2 100 %. Physical Exam  Constitutional: He is oriented to person, place, and time. He appears well-developed and well-nourished.  HENT:  Head: Normocephalic and atraumatic.  Eyes: Conjunctivae and EOM are normal. Pupils are equal, round, and reactive to light. No scleral icterus.  Neck: Normal range of motion. Neck supple. No JVD present. No tracheal deviation present. No thyromegaly present.  Cardiovascular: Normal rate and regular rhythm.  Exam reveals no gallop and no friction rub.   No murmur heard. Respiratory: Effort normal and breath sounds normal. No respiratory distress. He has no wheezes. He has no rales.  GI: Soft. Bowel sounds are normal. He exhibits no distension. There is  no tenderness. There is no rebound and no guarding.  Musculoskeletal: Normal range of motion. He exhibits no edema or tenderness.  Lymphadenopathy:    He has no cervical adenopathy.  Neurological: He is alert and oriented to person, place, and time. He has normal reflexes. He displays normal reflexes. No cranial nerve deficit. He exhibits normal muscle tone. Coordination normal.  Skin: Skin is warm and dry. No rash noted. No erythema. No pallor.  Psychiatric: He has a normal mood and affect. His behavior is normal. Judgment and thought content normal.     Assessment/Plan ARF Stop lisinopril Check urine sodium, urine creatinine, urine eosinophils Check renal ultrasound Hydrate with ns iv Check cmp in am  Dm2 Stop metformin due to renal failure Iss, fsbs ac and qhs  Copd with co2 retension Check abg in am  Anemia Check cbc in am  Hypercalcemia Ns iv Check cmp in am If still elevated, consider  Mag , phos, pth, pth rp, tsh, vitamin D   Hypertension Hold clonidine due to marginal bp  DVT prophylaxis:  Scd, heparin.    , Khadim 08/02/2014, 2:30 AM    

## 2014-08-02 NOTE — Care Management (Signed)
Patient's son Jacqulynn Cadet given list of private duty care agencies at his request and will begin to arrange for 24/7 supervision at discharge.
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
## 2014-08-02 NOTE — Progress Notes (Signed)
Patient not able to void. Bladder scan done earlier today 49ml noted in bladder. Condom cath applied today with no urine output noted. Patient refused to have foley catheter inserted earlier. Discuss with patient the importance of urine output to be monitored agreed to I&0 catheter 857ml of dark tea color cloudy urine noted. Patient tolerated foley catheter insertion well.

## 2014-08-03 DIAGNOSIS — R339 Retention of urine, unspecified: Secondary | ICD-10-CM | POA: Diagnosis present

## 2014-08-03 DIAGNOSIS — N390 Urinary tract infection, site not specified: Secondary | ICD-10-CM

## 2014-08-03 LAB — URINE CULTURE: Culture: NO GROWTH

## 2014-08-03 LAB — COMPREHENSIVE METABOLIC PANEL
ALK PHOS: 52 U/L (ref 38–126)
ALT: 52 U/L (ref 17–63)
ANION GAP: 9 (ref 5–15)
AST: 234 U/L — ABNORMAL HIGH (ref 15–41)
Albumin: 2.8 g/dL — ABNORMAL LOW (ref 3.5–5.0)
BUN: 68 mg/dL — ABNORMAL HIGH (ref 6–20)
CALCIUM: 8.3 mg/dL — AB (ref 8.9–10.3)
CHLORIDE: 107 mmol/L (ref 101–111)
CO2: 26 mmol/L (ref 22–32)
Creatinine, Ser: 3.31 mg/dL — ABNORMAL HIGH (ref 0.61–1.24)
GFR calc non Af Amer: 16 mL/min — ABNORMAL LOW (ref >60)
GFR, EST AFRICAN AMERICAN: 19 mL/min — AB (ref >60)
Glucose, Bld: 103 mg/dL — ABNORMAL HIGH (ref 65–99)
POTASSIUM: 3.8 mmol/L (ref 3.5–5.1)
Sodium: 142 mmol/L (ref 135–145)
Total Bilirubin: 0.3 mg/dL (ref 0.3–1.2)
Total Protein: 6.4 g/dL — ABNORMAL LOW (ref 6.5–8.1)

## 2014-08-03 LAB — PROTEIN ELECTROPHORESIS, SERUM
A/G Ratio: 0.8 (ref 0.7–1.7)
ALBUMIN ELP: 3 g/dL (ref 2.9–4.4)
ALPHA-2-GLOBULIN: 1.3 g/dL — AB (ref 0.4–1.0)
Alpha-1-Globulin: 0.2 g/dL (ref 0.0–0.4)
BETA GLOBULIN: 0.9 g/dL (ref 0.7–1.3)
Gamma Globulin: 1.3 g/dL (ref 0.4–1.8)
Globulin, Total: 3.7 g/dL (ref 2.2–3.9)
Total Protein ELP: 6.7 g/dL (ref 6.0–8.5)

## 2014-08-03 LAB — CBC
HEMATOCRIT: 33.6 % — AB (ref 39.0–52.0)
HEMOGLOBIN: 10.4 g/dL — AB (ref 13.0–17.0)
MCH: 25.9 pg — ABNORMAL LOW (ref 26.0–34.0)
MCHC: 31 g/dL (ref 30.0–36.0)
MCV: 83.6 fL (ref 78.0–100.0)
Platelets: 226 10*3/uL (ref 150–400)
RBC: 4.02 MIL/uL — ABNORMAL LOW (ref 4.22–5.81)
RDW: 19.3 % — ABNORMAL HIGH (ref 11.5–15.5)
WBC: 12.5 10*3/uL — ABNORMAL HIGH (ref 4.0–10.5)

## 2014-08-03 LAB — GLUCOSE, CAPILLARY
GLUCOSE-CAPILLARY: 133 mg/dL — AB (ref 65–99)
GLUCOSE-CAPILLARY: 116 mg/dL — AB (ref 65–99)
Glucose-Capillary: 98 mg/dL (ref 65–99)
Glucose-Capillary: 107 mg/dL — ABNORMAL HIGH (ref 65–99)

## 2014-08-03 LAB — HEMOGLOBIN A1C
HEMOGLOBIN A1C: 7 % — AB (ref 4.8–5.6)
MEAN PLASMA GLUCOSE: 154 mg/dL

## 2014-08-03 LAB — IRON AND TIBC
IRON: 12 ug/dL — AB (ref 45–182)
SATURATION RATIOS: 5 % — AB (ref 17.9–39.5)
TIBC: 255 ug/dL (ref 250–450)
UIBC: 243 ug/dL

## 2014-08-03 LAB — FERRITIN: Ferritin: 30 ng/mL (ref 24–336)

## 2014-08-03 LAB — TSH: TSH: 0.825 u[IU]/mL (ref 0.350–4.500)

## 2014-08-03 LAB — SODIUM, URINE, RANDOM: SODIUM UR: 75 mmol/L

## 2014-08-03 LAB — RETICULOCYTES
RBC.: 3.95 MIL/uL — ABNORMAL LOW (ref 4.22–5.81)
Retic Count, Absolute: 47.4 10*3/uL (ref 19.0–186.0)
Retic Ct Pct: 1.2 % (ref 0.4–3.1)

## 2014-08-03 LAB — FOLATE: Folate: 31 ng/mL (ref >5.9)

## 2014-08-03 MED ORDER — SODIUM CHLORIDE 0.9 % IV SOLN
1000.0000 mL | INTRAVENOUS | Status: DC
  Administered 2014-08-03 – 2014-08-04 (×3): 1000 mL via INTRAVENOUS

## 2014-08-03 MED ORDER — CLONIDINE HCL 0.1 MG PO TABS
0.1000 mg | ORAL_TABLET | Freq: Two times a day (BID) | ORAL | Status: DC
  Administered 2014-08-03 – 2014-08-04 (×3): 0.1 mg via ORAL
  Filled 2014-08-03 (×3): qty 1

## 2014-08-03 MED ORDER — TAMSULOSIN HCL 0.4 MG PO CAPS
0.4000 mg | ORAL_CAPSULE | Freq: Every day | ORAL | Status: DC
  Administered 2014-08-03: 0.4 mg via ORAL
  Filled 2014-08-03: qty 1
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
## 2014-08-03 NOTE — Progress Notes (Signed)
Patient refusing to get out of bed to be weighed on the stand up scale.  Explained the importance of weighing on the same scale every day and patient still refuses stating that he cant stand.  Weighed patient on the bed scale with weight increase.  Will pass to the day nurse to reassess.

## 2014-08-03 NOTE — Progress Notes (Signed)
[facility] PROGRESS NOTE  [name] UXL:[id number] DOB: 1933-07-24 DOA: 08/01/2014 PCP: Robert Bellow, MD  Assessment/Plan: ARF: likely related to presumed urinary retention and UTI in setting of CKD. Creatinine trending down. Will continue IV fluid and holding lisinopril. Await urine sodium, urine creatinine, urine eosinophils. Renal ultrasound reveals increased echogenicity of renal parenchyma bilaterally suggesting medical renal disease. Probable nonobstructive left renal calculus. No hydronephrosis is noted. Urine output good. Continue to monitor  Urinary retention: 840ml with in and out cath. Will start flomax. Will likely discharge with foley tomorrow. Will arrange for OP urology follow up.    UTI: likely secondary to #2. See above. Await cultures. Rocephin day #1. He is afebrile and non-toxic appearing.   Subacute delirium: likely related to above. Close to baseline this am. Continue IV fluids, anti-biotics and fluids as noted above. TSH and B12 within limits of normal. CT head without acute abnormality. Will likely need placement. SW consult  Dm2: holding metformin due to renal failure. HGA1c 7.0. CBG range 98-133. Continue SSI.   Copd with co2 retention: stable at baseline. Oxygen saturation level 98% on room air. Chest xray no active disease.  Anemia: hx of same.  hg trending down slightly. May be diluational. Current Hg 10.4. No s/sx bleeding. Anemia panel  Hypercalcemia: tending down with IV fluids. Follow OP   Hypertension: initially soft BP. Currently 144/92. Will resume home clonidine. montor   Code Status: full Family Communication: none present Disposition Plan: facility hopefully [email or web address]   Consultants:  none  Procedures:  none  Antibiotics:  Rocephin 08/03/14>>  HPI/Subjective: Awake alert oriented to self/place. Denies pain/discomfort  Objective: Filed Vitals:   08/03/14 0500  BP: 149/92  Pulse: 84  Temp: 98.8 F (37.1 C)  Resp:      Intake/Output Summary (Last 24 hours) at 08/03/14 1242 Last data filed at 08/03/14 1220  Gross per 24 hour  Intake 4224.17 ml  Output   2800 ml  Net 1424.17 ml   Filed Weights   08/01/14 2307 08/02/14 0330 08/03/14 0500  Weight: 104.327 kg (230 lb) 78.835 kg (173 lb 12.8 oz) 80.3 kg (177 lb 0.5 oz)    Exam:   General:  Well nourished appears comfortable  Cardiovascular: RRR no MGR no LE edema  Respiratory: normal effort somewhat shallow BS clear no wheeze or crackles  Abdomen: non-distended non-tender +BS  Musculoskeletal: no clubbing or cyanosis   Data Reviewed: Basic Metabolic Panel:  Recent Labs Lab 08/01/14 2334 08/02/14 0601 08/03/14 0519  NA 141 144 142  K 4.1 4.3 3.8  CL 101 105 107  CO2 26 27 26   GLUCOSE 167* 134* 103*  BUN 74* 77* 68*  CREATININE 4.85* 5.04* 3.31*  CALCIUM 10.9* 10.0 8.3*   Liver Function Tests:  Recent Labs Lab 08/01/14 2334 08/02/14 0601 08/03/14 0519  AST 324* 327* 234*  ALT 47 55 52  ALKPHOS 53 50 52  BILITOT 0.6 0.5 0.3  PROT 7.5 6.9 6.4*  ALBUMIN 3.5 3.2* 2.8*   No results for input(s): LIPASE, AMYLASE in the last 168 hours. No results for input(s): AMMONIA in the last 168 hours. CBC:  Recent Labs Lab 08/01/14 2334 08/02/14 0601 08/03/14 0519  WBC 14.1* 14.8* 12.5*  NEUTROABS 10.3*  --   --   HGB 11.9* 10.9* 10.4*  HCT 37.4* 35.2* 33.6*  MCV 81.1 82.1 83.6  PLT 240 255 226   Cardiac Enzymes: No results for input(s): CKTOTAL, CKMB, CKMBINDEX, TROPONINI in the last 168 hours. BNP (  last 3 results) No results for input(s): BNP in the last 8760 hours.  ProBNP (last 3 results) No results for input(s): PROBNP in the last 8760 hours.  CBG:  Recent Labs Lab 08/02/14 1219 08/02/14 1626 08/02/14 2022 08/03/14 0746 08/03/14 1139  GLUCAP 104* 131* 98 98 133*    Recent Results (from the past 240 hour(s))  Urine culture     Status: None   Collection Time: 08/02/14  1:23 AM  Result Value Ref Range  Status   Specimen Description URINE, CLEAN CATCH  Final   Special Requests NONE  Final   Culture   Final    NO GROWTH 1 DAY Performed at [facility]    Report Status 08/03/2014 FINAL  Final     Studies: Dg Chest 1 View  08/02/2014   CLINICAL DATA:  81 year old male with cough  EXAM: CHEST  1 VIEW  COMPARISON:  Radiograph dated 06/11/2013  FINDINGS: Single-view of the chest demonstrate interval improvement in the aeration of the lungs with decreased interstitial prominence. There is no focal consolidation, pleural effusion, or pneumothorax. Stable cardiac silhouette. Coronary vascular stent noted. The osseous structures are grossly unremarkable.  IMPRESSION: No active disease.   Electronically Signed   By: Anner Crete M.D.   On: 08/02/2014 00:37   Ct Head Wo Contrast  08/02/2014   CLINICAL DATA:  Acute onset of altered mental status. Initial encounter.  EXAM: CT HEAD WITHOUT CONTRAST  TECHNIQUE: Contiguous axial images were obtained from the base of the skull through the vertex without intravenous contrast.  COMPARISON:  CT of the head performed 06/11/2013  FINDINGS: There is no evidence of acute infarction, mass lesion, or intra- or extra-axial hemorrhage on CT.  Prominence of the ventricles and sulci reflects mild to moderate cortical volume loss. Cerebellar atrophy is noted. Scattered periventricular and subcortical white matter change likely reflects small vessel ischemic microangiopathy. A chronic lacunar infarct is noted at the left basal ganglia.  The brainstem and fourth ventricle are within normal limits. The cerebral hemispheres demonstrate grossly normal gray-white differentiation. No mass effect or midline shift is seen.  There is no evidence of fracture; visualized osseous structures are unremarkable in appearance. The visualized portions of the orbits are within normal limits. Small mucus retention cysts or polyps are noted at the right maxillary sinus. The remaining  paranasal sinuses and mastoid air cells are well-aerated. No significant soft tissue abnormalities are seen.  IMPRESSION: 1. No acute intracranial pathology seen on CT. 2. Mild to moderate cortical volume loss and scattered small vessel ischemic microangiopathy. 3. Chronic lacunar infarct at the left basal ganglia. 4. Small mucus retention cyst or polyp at the right maxillary sinus.   Electronically Signed   By: Garald Balding M.D.   On: 08/02/2014 01:03   US Renal  08/02/2014   CLINICAL DATA:  Acute renal failure.  EXAM: RENAL / URINARY TRACT ULTRASOUND COMPLETE  COMPARISON:  CT scan of May 26, 2014.  FINDINGS: Right Kidney:  Length: 10.9 cm. Increased echogenicity of renal parenchyma is noted suggesting medical renal disease. No mass or hydronephrosis visualized.  Left Kidney:  Length: 12 cm. 3.6 cm cyst is seen arising from midpole. 14 mm cyst is seen arising from upper pole. Probable 7 mm nonobstructive calculus seen in upper pole collecting system. Increased echogenicity of parenchyma is noted suggesting medical renal disease. No mass or hydronephrosis visualized.  Bladder:  Appears normal for degree of bladder distention. Left ureteral jet is seen. Right ureteral  jet is not visualized.  IMPRESSION: Increased echogenicity of renal parenchyma bilaterally suggesting medical renal disease. Probable nonobstructive left renal calculus. No hydronephrosis is noted.   Electronically Signed   By: Marijo Conception, M.D.   On: 08/02/2014 10:23    Scheduled Meds: . ALPRAZolam  1 mg Oral BID  . ALPRAZolam  1 mg Oral QHS  . antiseptic oral rinse  7 mL Mouth Rinse BID  . calcium carbonate  1 tablet Oral TID  . cefTRIAXone (ROCEPHIN)  IV  1 g Intravenous Q24H  . heparin  5,000 Units Subcutaneous 3 times per day  . insulin aspart  0-5 Units Subcutaneous QHS  . insulin aspart  0-9 Units Subcutaneous TID WC  . levothyroxine  112 mcg Oral QAC breakfast  . memantine  5 mg Oral BID  . sodium chloride  3 mL  Intravenous Q12H  . tamsulosin  0.4 mg Oral QHS   Continuous Infusions: . sodium chloride 1,000 mL (08/03/14 0834)    Principal Problem:   Subacute delirium Active Problems:   Hypothyroidism   Anemia, normocytic normochromic   Diabetes mellitus type 2 with complications   Chronic kidney disease, stage 3   Acute on chronic renal failure   Hypercalcemia   Dehydration   UTI (lower urinary tract infection)   COPD (chronic obstructive pulmonary disease)   Acute respiratory failure with hypoxia   Elevated AST (SGOT)   Urinary retention    Time spent: 30 minutes    [facility] Pager [phone]. If 7PM-7AM, please contact night-coverage at [email or web address], password [facility] 08/03/2014, 12:42 PM  LOS: 1 day

## 2014-08-04 ENCOUNTER — Inpatient Hospital Stay
Admission: RE | Admit: 2014-08-04 | Discharge: 2014-09-26 | Disposition: A | Discharge status: Home / Self Care | Payer: Medicare Other | Source: Ambulatory Visit | Attending: Internal Medicine | Admitting: Internal Medicine

## 2014-08-04 ENCOUNTER — Encounter (HOSPITAL_COMMUNITY): Payer: Self-pay | Admitting: Internal Medicine

## 2014-08-04 DIAGNOSIS — R0989 Other specified symptoms and signs involving the circulatory and respiratory systems: Principal | ICD-10-CM

## 2014-08-04 DIAGNOSIS — D649 Anemia, unspecified: Secondary | ICD-10-CM

## 2014-08-04 LAB — BASIC METABOLIC PANEL
Anion gap: 7 (ref 5–15)
BUN: 42 mg/dL — ABNORMAL HIGH (ref 6–20)
CO2: 26 mmol/L (ref 22–32)
Calcium: 7.7 mg/dL — ABNORMAL LOW (ref 8.9–10.3)
Chloride: 110 mmol/L (ref 101–111)
Creatinine, Ser: 2.04 mg/dL — ABNORMAL HIGH (ref 0.61–1.24)
GFR calc non Af Amer: 29 mL/min — ABNORMAL LOW (ref >60)
GFR, EST AFRICAN AMERICAN: 34 mL/min — AB (ref >60)
Glucose, Bld: 125 mg/dL — ABNORMAL HIGH (ref 65–99)
Potassium: 3.6 mmol/L (ref 3.5–5.1)
Sodium: 143 mmol/L (ref 135–145)

## 2014-08-04 LAB — UIFE/LIGHT CHAINS/TP QN, 24-HR UR
% BETA, URINE: 33.7 %
ALBUMIN, U: 23.3 %
ALPHA 1 URINE: 4.1 %
Alpha 2, Urine: 25.5 %
Free Kappa/Lambda Ratio: 5.56 (ref 2.04–10.37)
Free Lambda Lt Chains,Ur: 52.9 mg/L — ABNORMAL HIGH (ref 0.24–6.66)
Free Lt Chn Excr Rate: 294 mg/L — ABNORMAL HIGH (ref 1.35–24.19)
GAMMA GLOBULIN URINE: 13.5 %
TIME-UPE24: 2500 h
TOTAL PROTEIN, URINE-UR/DAY: 792.5 mg/(24.h) — AB (ref 30.0–150.0)
Total Protein, Urine: 31.7 mg/dL

## 2014-08-04 LAB — GLUCOSE, CAPILLARY
Glucose-Capillary: 163 mg/dL — ABNORMAL HIGH (ref 65–99)
Glucose-Capillary: 190 mg/dL — ABNORMAL HIGH (ref 65–99)

## 2014-08-04 LAB — CALCIUM / CREATININE RATIO, URINE
CALCIUM UR: 3.9 mg/dL
Calcium/Creat.Ratio: 69 mg/g creat (ref 0–260)
Creatinine, Urine: 56.5 mg/dL

## 2014-08-04 LAB — CBC
HEMATOCRIT: 32.4 % — AB (ref 39.0–52.0)
Hemoglobin: 9.8 g/dL — ABNORMAL LOW (ref 13.0–17.0)
MCH: 25.4 pg — ABNORMAL LOW (ref 26.0–34.0)
MCHC: 30.2 g/dL (ref 30.0–36.0)
MCV: 83.9 fL (ref 78.0–100.0)
PLATELETS: 200 10*3/uL (ref 150–400)
RBC: 3.86 MIL/uL — ABNORMAL LOW (ref 4.22–5.81)
RDW: 19.5 % — ABNORMAL HIGH (ref 11.5–15.5)
WBC: 8.5 10*3/uL (ref 4.0–10.5)

## 2014-08-04 MED ORDER — ALPRAZOLAM 1 MG PO TABS: 1.0000 mg | ORAL_TABLET | Freq: Two times a day (BID) | ORAL | Status: DC

## 2014-08-04 MED ORDER — ALPRAZOLAM 1 MG PO TABS: 1.0000 mg | ORAL_TABLET | Freq: Every day | ORAL | Status: DC

## 2014-08-04 MED ORDER — CIPROFLOXACIN HCL 250 MG PO TABS
500.0000 mg | ORAL_TABLET | Freq: Two times a day (BID) | ORAL | Status: DC
  Administered 2014-08-04: 500 mg via ORAL
  Filled 2014-08-04: qty 2

## 2014-08-04 MED ORDER — FERROUS SULFATE 325 (65 FE) MG PO TABS: 325.0000 mg | ORAL_TABLET | Freq: Two times a day (BID) | ORAL | Status: AC

## 2014-08-04 MED ORDER — CIPROFLOXACIN HCL 500 MG PO TABS
500.0000 mg | ORAL_TABLET | Freq: Two times a day (BID) | ORAL | Status: DC
Start: 2014-08-04 — End: 2015-06-09

## 2014-08-04 MED ORDER — TAMSULOSIN HCL 0.4 MG PO CAPS: 0.4000 mg | ORAL_CAPSULE | Freq: Every day | ORAL | Status: AC

## 2014-08-04 MED ORDER — SODIUM CHLORIDE 0.9 % IV SOLN
1000.0000 mL | INTRAVENOUS | Status: DC
  Administered 2014-08-04: 1000 mL via INTRAVENOUS

## 2014-08-04 MED ORDER — FERROUS SULFATE 325 (65 FE) MG PO TABS: 325.0000 mg | ORAL_TABLET | Freq: Two times a day (BID) | ORAL | Status: DC

## 2014-08-04 NOTE — Clinical Social Work Note (Addendum)
Patient is now under the guardianship of [facility]. His social worker is Carlis Stable [phone]. CSW placed protective order on patient's chart.     Ihor Gully, [place]

## 2014-08-04 NOTE — Discharge Summary (Signed)
Physician Discharge Summary  [name] RAQ:[id number] DOB: Mar 03, 1933 DOA: 08/01/2014  PCP: Robert Bellow, MD  Admit date: 08/01/2014 Discharge date: 08/04/2014  Time spent: 40 minutes  Recommendations for Outpatient Follow-up:  1. Follow up with Dr Alyson Ingles urology 08/18/14 for evaluation of urinary retention. flomax started and being discharged with foley. Recommend BMET 1 week to track creatinine and cbc to track Hg. . Follow hepatitis panel 2. Being discharged to snf  Discharge Diagnoses:  Principal Problem:   Acute on chronic renal failure Active Problems:   Hypothyroidism   Anemia, normocytic normochromic   Diabetes mellitus type 2 with complications   Chronic kidney disease, stage 3   Hypercalcemia   Dehydration   UTI (lower urinary tract infection)   Subacute delirium   COPD (chronic obstructive pulmonary disease)   Acute respiratory failure with hypoxia   Elevated AST (SGOT)   Urinary retention   Discharge Condition: stable  Diet recommendation: carb modified heart healthy  Filed Weights   08/02/14 0330 08/03/14 0500 08/04/14 0611  Weight: 78.835 kg (173 lb 12.8 oz) 80.3 kg (177 lb 0.5 oz) 84.55 kg (186 lb 6.4 oz)    History of present illness:  81 y.o. male brought in by ambulance, who presented to the Emergency Department on 08/02/14 complaining of AMS. Pt was sent by family, because of worsening alzheimer's disease and the family's inability to manage him at home. Pt was found to be in ARF due to dehydration to have hypercalcemia. Pt unable to give history due to dementia.    Hospital Course:  Acute on chronic renal failure: stage II-II: likely related to urinary retention and UTI in setting of CKD. Foley inserted and patient provided with IV fluids and creatinine trended down daily. At discharge creatinine 2.04. Chart review indicates baseline range is 1.4-2.2. Urine sodium, urine creatinine within limits of normal. Renal ultrasound reveals increased  echogenicity of renal parenchyma bilaterally suggesting medical renal disease. Probable nonobstructive left renal calculus. No hydronephrosis is noted. Urine output good. Recommend BMET 1 week to trend creatinine. Has follow up appointment with urology 08/18/14.   Urinary retention: 844ml with in and out cath. Foley inserted. Renal US as noted above.  Flomax started. Will  discharge with foley  OP urology follow up scheduled 08/18/14   UTI: likely secondary to #2. See above. Cultures with no growth at discharge. He received Rocephin for 2 days. Will discharge with Cipro to complete 7 day course. He remained afebrile and non-toxic appearing.   Subacute delirium: likely related to above. Quickly resolved. TSH and B12 within limits of normal. CT head without acute abnormality. Evaluated by PT who recommended SNF. At baseline at discharge  Dm2:  HGA1c 7.0. Oral agents held on admission due to above. Provided SSI. Resume home regimen at discharge   Copd with co2 retention: stable at baseline. Oxygen saturation level 98% on room air. Chest xray no active disease.  Anemia: normocytic IDA likely related to chronic disease. Hg at baseline. No s/sx bleeding.   Hypercalcemia: calcium level 7.7 at discharge.    Hypertension: initially soft BP but resolved with fluids. home clonidine resumed.  Elevated AST: other transaminases within limits of normal. Follow hepatitis panel   Procedures:  none  Consultations:  none  Discharge Exam: Filed Vitals:   08/04/14 0611  BP: 140/55  Pulse: 76  Temp: 97.9 F (36.6 C)  Resp: 20    General: appears comfortable Cardiovascular: S1 and S2 no MGR no LE edema Respiratory: normal effort  BS clear bilaterally no wheeze or crakles  Discharge Instructions    Current Discharge Medication List    START taking these medications   Details  ciprofloxacin (CIPRO) 500 MG tablet Take 1 tablet (500 mg total) by mouth 2 (two) times daily. Qty: 10 tablet,  Refills: 0    tamsulosin (FLOMAX) 0.4 MG CAPS capsule Take 1 capsule (0.4 mg total) by mouth at bedtime. Qty: 30 capsule, Refills: 0      CONTINUE these medications which have NOT CHANGED   Details  ALPRAZolam (XANAX) 1 MG tablet Take 1-2 mg by mouth 2 (two) times daily. TAKES ONE TABLET IN THE MORNING AND TWO AT BEDTIME Refills: 2    aspirin 81 MG EC tablet Take 81 mg by mouth daily.      cloNIDine (CATAPRES) 0.1 MG tablet Take 0.1 mg by mouth 2 (two) times daily.     glipiZIDE (GLUCOTROL) 5 MG tablet Take 5 mg by mouth at bedtime.     levothyroxine (SYNTHROID, LEVOTHROID) 112 MCG tablet Take 112 mcg by mouth daily before breakfast.    losartan (COZAAR) 50 MG tablet Take 50 mg by mouth daily.  Refills: 10    Melatonin 3 MG CAPS Take 6 mg by mouth at bedtime.    memantine (NAMENDA) 10 MG tablet Take 10 mg by mouth 2 (two) times daily. Refills: 5    metFORMIN (GLUCOPHAGE-XR) 500 MG 24 hr tablet Take 500 mg by mouth daily.    nitroGLYCERIN (NITROSTAT) 0.4 MG SL tablet Place 0.4 mg under the tongue every 5 (five) minutes as needed for chest pain.     senna (SENOKOT) 8.6 MG tablet Take 1 tablet by mouth daily as needed for constipation.      STOP taking these medications     pravastatin (PRAVACHOL) 40 MG tablet        Allergies  Allergen Reactions  . Prednisone     Doesn't like to take high doses   Follow-up Information    Follow up with Nicolette Bang  On 08/18/2014.   Why:  appointment at [place]. evaluation of urinary retention   Contact information:          The results of significant diagnostics from this hospitalization (including imaging, microbiology, ancillary and laboratory) are listed below for reference.    Significant Diagnostic Studies: Dg Chest 1 View  08/02/2014   CLINICAL DATA:  81 year old male with cough  EXAM: CHEST  1 VIEW  COMPARISON:  Radiograph dated 06/11/2013  FINDINGS: Single-view of the chest demonstrate interval improvement in  the aeration of the lungs with decreased interstitial prominence. There is no focal consolidation, pleural effusion, or pneumothorax. Stable cardiac silhouette. Coronary vascular stent noted. The osseous structures are grossly unremarkable.  IMPRESSION: No active disease.   Electronically Signed   By: Anner Crete M.D.   On: 08/02/2014 00:37   Ct Head Wo Contrast  08/02/2014   CLINICAL DATA:  Acute onset of altered mental status. Initial encounter.  EXAM: CT HEAD WITHOUT CONTRAST  TECHNIQUE: Contiguous axial images were obtained from the base of the skull through the vertex without intravenous contrast.  COMPARISON:  CT of the head performed 06/11/2013  FINDINGS: There is no evidence of acute infarction, mass lesion, or intra- or extra-axial hemorrhage on CT.  Prominence of the ventricles and sulci reflects mild to moderate cortical volume loss. Cerebellar atrophy is noted. Scattered periventricular and subcortical white matter change likely reflects small vessel ischemic microangiopathy. A chronic lacunar infarct is noted at  the left basal ganglia.  The brainstem and fourth ventricle are within normal limits. The cerebral hemispheres demonstrate grossly normal gray-white differentiation. No mass effect or midline shift is seen.  There is no evidence of fracture; visualized osseous structures are unremarkable in appearance. The visualized portions of the orbits are within normal limits. Small mucus retention cysts or polyps are noted at the right maxillary sinus. The remaining paranasal sinuses and mastoid air cells are well-aerated. No significant soft tissue abnormalities are seen.  IMPRESSION: 1. No acute intracranial pathology seen on CT. 2. Mild to moderate cortical volume loss and scattered small vessel ischemic microangiopathy. 3. Chronic lacunar infarct at the left basal ganglia. 4. Small mucus retention cyst or polyp at the right maxillary sinus.   Electronically Signed   By: Garald Balding M.D.    On: 08/02/2014 01:03   US Renal  08/02/2014   CLINICAL DATA:  Acute renal failure.  EXAM: RENAL / URINARY TRACT ULTRASOUND COMPLETE  COMPARISON:  CT scan of May 26, 2014.  FINDINGS: Right Kidney:  Length: 10.9 cm. Increased echogenicity of renal parenchyma is noted suggesting medical renal disease. No mass or hydronephrosis visualized.  Left Kidney:  Length: 12 cm. 3.6 cm cyst is seen arising from midpole. 14 mm cyst is seen arising from upper pole. Probable 7 mm nonobstructive calculus seen in upper pole collecting system. Increased echogenicity of parenchyma is noted suggesting medical renal disease. No mass or hydronephrosis visualized.  Bladder:  Appears normal for degree of bladder distention. Left ureteral jet is seen. Right ureteral jet is not visualized.  IMPRESSION: Increased echogenicity of renal parenchyma bilaterally suggesting medical renal disease. Probable nonobstructive left renal calculus. No hydronephrosis is noted.   Electronically Signed   By: Marijo Conception, M.D.   On: 08/02/2014 10:23    Microbiology: Recent Results (from the past 240 hour(s))  Urine culture     Status: None   Collection Time: 08/02/14  1:23 AM  Result Value Ref Range Status   Specimen Description URINE, CLEAN CATCH  Final   Special Requests NONE  Final   Culture   Final    NO GROWTH 1 DAY Performed at [facility]    Report Status 08/03/2014 FINAL  Final     Labs: Basic Metabolic Panel:  Recent Labs Lab 08/01/14 2334 08/02/14 0601 08/03/14 0519 08/04/14 0643  NA 141 144 142 143  K 4.1 4.3 3.8 3.6  CL 101 105 107 110  CO2 26 27 26 26   GLUCOSE 167* 134* 103* 125*  BUN 74* 77* 68* 42*  CREATININE 4.85* 5.04* 3.31* 2.04*  CALCIUM 10.9* 10.0 8.3* 7.7*   Liver Function Tests:  Recent Labs Lab 08/01/14 2334 08/02/14 0601 08/03/14 0519  AST 324* 327* 234*  ALT 47 55 52  ALKPHOS 53 50 52  BILITOT 0.6 0.5 0.3  PROT 7.5 6.9 6.4*  ALBUMIN 3.5 3.2* 2.8*   No results for input(s):  LIPASE, AMYLASE in the last 168 hours. No results for input(s): AMMONIA in the last 168 hours. CBC:  Recent Labs Lab 08/01/14 2334 08/02/14 0601 08/03/14 0519 08/04/14 0643  WBC 14.1* 14.8* 12.5* 8.5  NEUTROABS 10.3*  --   --   --   HGB 11.9* 10.9* 10.4* 9.8*  HCT 37.4* 35.2* 33.6* 32.4*  MCV 81.1 82.1 83.6 83.9  PLT 240 255 226 200   Cardiac Enzymes: No results for input(s): CKTOTAL, CKMB, CKMBINDEX, TROPONINI in the last 168 hours. BNP: BNP (last 3 results)  No results for input(s): BNP in the last 8760 hours.  ProBNP (last 3 results) No results for input(s): PROBNP in the last 8760 hours.  CBG:  Recent Labs Lab 08/03/14 1139 08/03/14 1702 08/03/14 2134 08/04/14 0735 08/04/14 1104  GLUCAP 133* 116* 107* 163* 190*       Signed:  Kaeden Mester M  [facility] 08/04/2014, 12:49 PM

## 2014-08-04 NOTE — Discharge Summary (Deleted)
Physician Discharge Summary  [name] TTS:[id number] DOB: August 07, 1933 DOA: 08/01/2014  PCP: Robert Bellow, MD  Admit date: 08/01/2014 Discharge date: 08/04/2014  Time spent: 40 minutes  Recommendations for Outpatient Follow-up:  1. Follow up with Dr Alyson Ingles urology 08/18/14 for evaluation of urinary retention. flomax started and being discharged with foley. Recommend BMET 1 week to track creatinine and cbc to track Hg. . Follow hepatitis panel 2. Being discharged to snf  Discharge Diagnoses:  Principal Problem:   Acute on chronic renal failure Active Problems:   Dehydration   UTI (lower urinary tract infection)   Hypercalcemia   Acute respiratory failure with hypoxia   Urinary retention   Hypothyroidism   Anemia, normocytic normochromic   Diabetes mellitus type 2 with complications   Chronic kidney disease, stage 3   Subacute delirium   COPD (chronic obstructive pulmonary disease)   Elevated AST (SGOT)   Discharge Condition: stable  Diet recommendation: carb modified heart healthy  Filed Weights   08/02/14 0330 08/03/14 0500 08/04/14 0611  Weight: 78.835 kg (173 lb 12.8 oz) 80.3 kg (177 lb 0.5 oz) 84.55 kg (186 lb 6.4 oz)    History of present illness:  81 y.o. male brought in by ambulance, who presented to the Emergency Department on 08/02/14 complaining of AMS. Pt was sent by family, because of worsening alzheimer's disease and the family's inability to manage him at home. Pt was found to be in ARF due to dehydration to have hypercalcemia. Pt unable to give history due to dementia.    Hospital Course:  Acute on chronic renal failure: stage II-II: likely related to urinary retention and UTI in setting of CKD. Foley inserted and patient provided with IV fluids and creatinine trended down daily. At discharge creatinine 2.04. Chart review indicates baseline range is 1.4-2.2. Urine sodium, urine creatinine within limits of normal. Renal ultrasound reveals increased  echogenicity of renal parenchyma bilaterally suggesting medical renal disease. Probable nonobstructive left renal calculus. No hydronephrosis is noted. Urine output good. Recommend BMET 1 week to trend creatinine. Has follow up appointment with urology 08/18/14.   Urinary retention: 860ml with in and out cath. Foley inserted. Renal US as noted above.  Flomax started. Will  discharge with foley  OP urology follow up scheduled 08/18/14   UTI: likely secondary to #2. See above. Cultures with no growth at discharge. He received Rocephin for 2 days. Will discharge with Cipro to complete 7 day course. He remained afebrile and non-toxic appearing.   Subacute delirium: likely related to above. Quickly resolved. TSH and B12 within limits of normal. CT head without acute abnormality. Evaluated by PT who recommended SNF. At baseline at discharge  Dm2:  HGA1c 7.0. Oral agents held on admission due to above. Provided SSI. Resume home regimen at discharge   Copd with co2 retention: stable at baseline. Oxygen saturation level 98% on room air. Chest xray no active disease.  Anemia: normocytic IDA likely related to chronic disease. Hg at baseline. No s/sx bleeding.   Hypercalcemia: calcium level 7.7 at discharge.    Hypertension: initially soft BP but resolved with fluids. home clonidine resumed.  Elevated AST: other transaminases within limits of normal. Follow hepatitis panel   Procedures:  none  Consultations:  none  Discharge Exam: Filed Vitals:   08/04/14 0611  BP: 140/55  Pulse: 76  Temp: 97.9 F (36.6 C)  Resp: 20    General: appears comfortable Cardiovascular: S1 and S2 no MGR no LE edema Respiratory: normal effort  BS clear bilaterally no wheeze or crakles  Discharge Instructions    Current Discharge Medication List    START taking these medications   Details  ciprofloxacin (CIPRO) 500 MG tablet Take 1 tablet (500 mg total) by mouth 2 (two) times daily. Qty: 10 tablet,  Refills: 0    ferrous sulfate 325 (65 FE) MG tablet Take 1 tablet (325 mg total) by mouth 2 (two) times daily with a meal. Refills: 3    tamsulosin (FLOMAX) 0.4 MG CAPS capsule Take 1 capsule (0.4 mg total) by mouth at bedtime. Qty: 30 capsule, Refills: 0      CONTINUE these medications which have NOT CHANGED   Details  ALPRAZolam (XANAX) 1 MG tablet Take 1-2 mg by mouth 2 (two) times daily. TAKES ONE TABLET IN THE MORNING AND TWO AT BEDTIME Refills: 2    aspirin 81 MG EC tablet Take 81 mg by mouth daily.      cloNIDine (CATAPRES) 0.1 MG tablet Take 0.1 mg by mouth 2 (two) times daily.     glipiZIDE (GLUCOTROL) 5 MG tablet Take 5 mg by mouth at bedtime.     levothyroxine (SYNTHROID, LEVOTHROID) 112 MCG tablet Take 112 mcg by mouth daily before breakfast.    losartan (COZAAR) 50 MG tablet Take 50 mg by mouth daily.  Refills: 10    Melatonin 3 MG CAPS Take 6 mg by mouth at bedtime.    memantine (NAMENDA) 10 MG tablet Take 10 mg by mouth 2 (two) times daily. Refills: 5    metFORMIN (GLUCOPHAGE-XR) 500 MG 24 hr tablet Take 500 mg by mouth daily.    nitroGLYCERIN (NITROSTAT) 0.4 MG SL tablet Place 0.4 mg under the tongue every 5 (five) minutes as needed for chest pain.     senna (SENOKOT) 8.6 MG tablet Take 1 tablet by mouth daily as needed for constipation.      STOP taking these medications     pravastatin (PRAVACHOL) 40 MG tablet        Allergies  Allergen Reactions  . Prednisone     Doesn't like to take high doses   Follow-up Information    Follow up with Nicolette Bang  On 08/18/2014.   Why:  appointment at [facility]. evaluation of urinary retention   Contact information:   [place]       The results of significant diagnostics from this hospitalization (including imaging, microbiology, ancillary and laboratory) are listed below for reference.    Significant Diagnostic Studies: Dg Chest 1 View  08/02/2014   CLINICAL DATA:  81 year old male with cough  EXAM:  CHEST  1 VIEW  COMPARISON:  Radiograph dated 06/11/2013  FINDINGS: Single-view of the chest demonstrate interval improvement in the aeration of the lungs with decreased interstitial prominence. There is no focal consolidation, pleural effusion, or pneumothorax. Stable cardiac silhouette. Coronary vascular stent noted. The osseous structures are grossly unremarkable.  IMPRESSION: No active disease.   Electronically Signed   By: Anner Crete M.D.   On: 08/02/2014 00:37   Ct Head Wo Contrast  08/02/2014   CLINICAL DATA:  Acute onset of altered mental status. Initial encounter.  EXAM: CT HEAD WITHOUT CONTRAST  TECHNIQUE: Contiguous axial images were obtained from the base of the skull through the vertex without intravenous contrast.  COMPARISON:  CT of the head performed 06/11/2013  FINDINGS: There is no evidence of acute infarction, mass lesion, or intra- or extra-axial hemorrhage on CT.  Prominence of the ventricles and sulci reflects mild to moderate  cortical volume loss. Cerebellar atrophy is noted. Scattered periventricular and subcortical white matter change likely reflects small vessel ischemic microangiopathy. A chronic lacunar infarct is noted at the left basal ganglia.  The brainstem and fourth ventricle are within normal limits. The cerebral hemispheres demonstrate grossly normal gray-white differentiation. No mass effect or midline shift is seen.  There is no evidence of fracture; visualized osseous structures are unremarkable in appearance. The visualized portions of the orbits are within normal limits. Small mucus retention cysts or polyps are noted at the right maxillary sinus. The remaining paranasal sinuses and mastoid air cells are well-aerated. No significant soft tissue abnormalities are seen.  IMPRESSION: 1. No acute intracranial pathology seen on CT. 2. Mild to moderate cortical volume loss and scattered small vessel ischemic microangiopathy. 3. Chronic lacunar infarct at the left basal  ganglia. 4. Small mucus retention cyst or polyp at the right maxillary sinus.   Electronically Signed   By: Garald Balding M.D.   On: 08/02/2014 01:03   US Renal  08/02/2014   CLINICAL DATA:  Acute renal failure.  EXAM: RENAL / URINARY TRACT ULTRASOUND COMPLETE  COMPARISON:  CT scan of May 26, 2014.  FINDINGS: Right Kidney:  Length: 10.9 cm. Increased echogenicity of renal parenchyma is noted suggesting medical renal disease. No mass or hydronephrosis visualized.  Left Kidney:  Length: 12 cm. 3.6 cm cyst is seen arising from midpole. 14 mm cyst is seen arising from upper pole. Probable 7 mm nonobstructive calculus seen in upper pole collecting system. Increased echogenicity of parenchyma is noted suggesting medical renal disease. No mass or hydronephrosis visualized.  Bladder:  Appears normal for degree of bladder distention. Left ureteral jet is seen. Right ureteral jet is not visualized.  IMPRESSION: Increased echogenicity of renal parenchyma bilaterally suggesting medical renal disease. Probable nonobstructive left renal calculus. No hydronephrosis is noted.   Electronically Signed   By: Marijo Conception, M.D.   On: 08/02/2014 10:23    Microbiology: Recent Results (from the past 240 hour(s))  Urine culture     Status: None   Collection Time: 08/02/14  1:23 AM  Result Value Ref Range Status   Specimen Description URINE, CLEAN CATCH  Final   Special Requests NONE  Final   Culture   Final    NO GROWTH 1 DAY Performed at [facility]    Report Status 08/03/2014 FINAL  Final     Labs: Basic Metabolic Panel:  Recent Labs Lab 08/01/14 2334 08/02/14 0601 08/03/14 0519 08/04/14 0643  NA 141 144 142 143  K 4.1 4.3 3.8 3.6  CL 101 105 107 110  CO2 26 27 26 26   GLUCOSE 167* 134* 103* 125*  BUN 74* 77* 68* 42*  CREATININE 4.85* 5.04* 3.31* 2.04*  CALCIUM 10.9* 10.0 8.3* 7.7*   Liver Function Tests:  Recent Labs Lab 08/01/14 2334 08/02/14 0601 08/03/14 0519  AST 324* 327* 234*   ALT 47 55 52  ALKPHOS 53 50 52  BILITOT 0.6 0.5 0.3  PROT 7.5 6.9 6.4*  ALBUMIN 3.5 3.2* 2.8*   No results for input(s): LIPASE, AMYLASE in the last 168 hours. No results for input(s): AMMONIA in the last 168 hours. CBC:  Recent Labs Lab 08/01/14 2334 08/02/14 0601 08/03/14 0519 08/04/14 0643  WBC 14.1* 14.8* 12.5* 8.5  NEUTROABS 10.3*  --   --   --   HGB 11.9* 10.9* 10.4* 9.8*  HCT 37.4* 35.2* 33.6* 32.4*  MCV 81.1 82.1 83.6 83.9  PLT 240 255 226 200   Cardiac Enzymes: No results for input(s): CKTOTAL, CKMB, CKMBINDEX, TROPONINI in the last 168 hours. BNP: BNP (last 3 results) No results for input(s): BNP in the last 8760 hours.  ProBNP (last 3 results) No results for input(s): PROBNP in the last 8760 hours.  CBG:  Recent Labs Lab 08/03/14 1139 08/03/14 1702 08/03/14 2134 08/04/14 0735 08/04/14 1104  GLUCAP 133* 116* 107* 163* 190*       Signed:  BLACK, KAREN  [facility] 08/04/2014, 3:00 PM

## 2014-08-04 NOTE — Progress Notes (Signed)
Patient has order to be discharge to the [facility]. Report called to Santiago Glad, nurse. Discharge packet sent with patient. Patient stable. Patient transported via staff.

## 2014-08-04 NOTE — Clinical Social Work Note (Signed)
CSW facilitated discharge.  CSW notified facility and provided auth number and review date.  CSW notified Carlis Stable of patient's discharge.  CSW signing off.   Novalyn Lajara D, LCSW. [phone]

## 2014-08-04 NOTE — Care Management Note (Signed)
Case Management Note  Patient Details  Name: ESTELL DILLINGER MRN: [id number] Date of Birth: 1933-08-14  Expected Discharge Date:                  Expected Discharge Plan:  [place]  In-House Referral:  Clinical Social Work  Discharge planning Services  CM Consult  Post Acute Care Choice:  NA Choice offered to:  NA  DME Arranged:    DME Agency:     HH Arranged:    [place] Agency:     Status of Service:  Completed, signed off  Medicare Important Message Given:    Date Medicare IM Given:    Medicare IM give by:    Date Additional Medicare IM Given:    Additional Medicare Important Message give by:     If discussed at [place] of Stay Meetings, dates discussed:    Additional Comments: DSS has taken temporary guardianship of pt. Pt will be discharge to SNF for rehab purposes. CSW is aware and working to arrange for placement. [facility] has been made aware of change to discharge plan. No further CM needs. Patient discharging today.  Sherald Barge, RN 08/04/2014, 12:37 PM

## 2014-08-04 NOTE — Clinical Social Work Placement (Signed)
   CLINICAL SOCIAL WORK PLACEMENT  NOTE  Date:  08/04/2014  Patient Details  Name: [name] MRN: [id number] Date of Birth: 06/23/1933  Clinical Social Work is seeking post-discharge placement for this patient at the [place] level of care (*CSW will initial, date and re-position this form in  chart as items are completed):  Yes   Patient/family provided with [place] Work Department's list of facilities offering this level of care within the geographic area requested by the patient (or if unable, by the patient's family).  Yes   Patient/family informed of their freedom to choose among providers that offer the needed level of care, that participate in Medicare, Medicaid or managed care program needed by the patient, have an available bed and are willing to accept the patient.  Yes   Patient/family informed of [facility]'s ownership interest in [facility] and [facility], as well as of the fact that they are under no obligation to receive care at these facilities.  PASRR submitted to EDS on 08/03/14     PASRR number received on 08/03/14     Existing PASRR number confirmed on       FL2 transmitted to all facilities in geographic area requested by pt/family on 08/04/14     FL2 transmitted to all facilities within larger geographic area on       Patient informed that his/her managed care company has contracts with or will negotiate with certain facilities, including the following:  [facility]     Yes   Patient/family informed of bed offers received.  Patient chooses bed at [facility]     Physician recommends and patient chooses bed at      Patient to be transferred to [facility] on 08/04/14.  Patient to be transferred to facility by [facility]     Patient family notified on 08/04/14 of transfer.  Name of family member notified:  Carlis Stable, DSS Social Worker     PHYSICIAN       Additional Comment:   Auth details  Auth number: [id number]  Approved for four days, next review date is Monday 08/07/14.  Preliminary [place] level RVB. _______________________________________________ Ambrose Pancoast D, LCSW 08/04/2014, 1:05 PM [phone]

## 2014-08-05 LAB — HEPATITIS PANEL, ACUTE
HCV Ab: <0.1 s/co ratio (ref 0.0–0.9)
HEP A IGM: NEGATIVE
HEP B C IGM: NEGATIVE
Hepatitis B Surface Ag: NEGATIVE

## 2014-08-06 ENCOUNTER — Non-Acute Institutional Stay (SKILLED_NURSING_FACILITY): Payer: Medicare Other | Admitting: Internal Medicine

## 2014-08-06 DIAGNOSIS — E1121 Type 2 diabetes mellitus with diabetic nephropathy: Secondary | ICD-10-CM

## 2014-08-06 DIAGNOSIS — N179 Acute kidney failure, unspecified: Secondary | ICD-10-CM

## 2014-08-06 DIAGNOSIS — R339 Retention of urine, unspecified: Secondary | ICD-10-CM | POA: Diagnosis not present

## 2014-08-06 DIAGNOSIS — R269 Unspecified abnormalities of gait and mobility: Secondary | ICD-10-CM | POA: Diagnosis not present

## 2014-08-06 DIAGNOSIS — N183 Chronic kidney disease, stage 3 unspecified: Secondary | ICD-10-CM
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
## 2014-08-07 ENCOUNTER — Encounter (HOSPITAL_COMMUNITY)
Admission: RE | Admit: 2014-08-07 | Discharge: 2014-08-07 | Disposition: A | Discharge status: Home / Self Care | Payer: Medicare Other | Source: Skilled Nursing Facility | Attending: Internal Medicine | Admitting: Internal Medicine

## 2014-08-07 ENCOUNTER — Other Ambulatory Visit: Payer: Self-pay | Admitting: *Deleted

## 2014-08-07 LAB — COMPREHENSIVE METABOLIC PANEL
ALBUMIN: 2.5 g/dL — AB (ref 3.5–5.0)
ALT: 52 U/L (ref 17–63)
ANION GAP: 5 (ref 5–15)
AST: 84 U/L — AB (ref 15–41)
Alkaline Phosphatase: 46 U/L (ref 38–126)
BILIRUBIN TOTAL: 0.3 mg/dL (ref 0.3–1.2)
BUN: 23 mg/dL — ABNORMAL HIGH (ref 6–20)
CO2: 27 mmol/L (ref 22–32)
Calcium: 7.2 mg/dL — ABNORMAL LOW (ref 8.9–10.3)
Chloride: 108 mmol/L (ref 101–111)
Creatinine, Ser: 1.48 mg/dL — ABNORMAL HIGH (ref 0.61–1.24)
GFR calc Af Amer: 50 mL/min — ABNORMAL LOW (ref >60)
GFR calc non Af Amer: 43 mL/min — ABNORMAL LOW (ref >60)
Glucose, Bld: 98 mg/dL (ref 65–99)
Potassium: 3.6 mmol/L (ref 3.5–5.1)
Sodium: 140 mmol/L (ref 135–145)
TOTAL PROTEIN: 5.7 g/dL — AB (ref 6.5–8.1)

## 2014-08-07 LAB — CBC
HCT: 30.6 % — ABNORMAL LOW (ref 39.0–52.0)
Hemoglobin: 9.6 g/dL — ABNORMAL LOW (ref 13.0–17.0)
MCH: 25.9 pg — ABNORMAL LOW (ref 26.0–34.0)
MCHC: 31.4 g/dL (ref 30.0–36.0)
MCV: 82.5 fL (ref 78.0–100.0)
Platelets: 240 10*3/uL (ref 150–400)
RBC: 3.71 MIL/uL — AB (ref 4.22–5.81)
RDW: 19.9 % — ABNORMAL HIGH (ref 11.5–15.5)
WBC: 8 10*3/uL (ref 4.0–10.5)

## 2014-08-07 MED ORDER — ALPRAZOLAM 1 MG PO TABS: ORAL_TABLET | ORAL | Status: AC

## 2014-08-07 NOTE — Telephone Encounter (Signed)
[facility] 

## 2014-08-09 ENCOUNTER — Non-Acute Institutional Stay (SKILLED_NURSING_FACILITY): Payer: Medicare Other | Admitting: Internal Medicine

## 2014-08-09 ENCOUNTER — Ambulatory Visit (HOSPITAL_COMMUNITY): Payer: Medicare Other | Attending: Internal Medicine

## 2014-08-09 DIAGNOSIS — Z87891 Personal history of nicotine dependence: Secondary | ICD-10-CM | POA: Insufficient documentation

## 2014-08-09 DIAGNOSIS — E119 Type 2 diabetes mellitus without complications: Secondary | ICD-10-CM | POA: Insufficient documentation

## 2014-08-09 DIAGNOSIS — J441 Chronic obstructive pulmonary disease with (acute) exacerbation: Secondary | ICD-10-CM | POA: Diagnosis not present

## 2014-08-09 DIAGNOSIS — J449 Chronic obstructive pulmonary disease, unspecified: Secondary | ICD-10-CM | POA: Insufficient documentation

## 2014-08-09 DIAGNOSIS — R062 Wheezing: Secondary | ICD-10-CM | POA: Diagnosis present

## 2014-08-09 DIAGNOSIS — N179 Acute kidney failure, unspecified: Secondary | ICD-10-CM

## 2014-08-09 DIAGNOSIS — I2510 Atherosclerotic heart disease of native coronary artery without angina pectoris: Secondary | ICD-10-CM | POA: Diagnosis not present

## 2014-08-09 DIAGNOSIS — N183 Chronic kidney disease, stage 3 unspecified: Secondary | ICD-10-CM

## 2014-08-12 LAB — CBC WITH DIFFERENTIAL/PLATELET
Basophils Absolute: 0.1 10*3/uL (ref 0.0–0.1)
Basophils Relative: 1 % (ref 0–1)
Eosinophils Absolute: 0.6 10*3/uL (ref 0.0–0.7)
Eosinophils Relative: 8 % — ABNORMAL HIGH (ref 0–5)
HCT: 29.7 % — ABNORMAL LOW (ref 39.0–52.0)
HEMOGLOBIN: 9.3 g/dL — AB (ref 13.0–17.0)
LYMPHS ABS: 2.5 10*3/uL (ref 0.7–4.0)
Lymphocytes Relative: 32 % (ref 12–46)
MCH: 26.3 pg (ref 26.0–34.0)
MCHC: 31.3 g/dL (ref 30.0–36.0)
MCV: 83.9 fL (ref 78.0–100.0)
Monocytes Absolute: 0.6 10*3/uL (ref 0.1–1.0)
Monocytes Relative: 8 % (ref 3–12)
NEUTROS PCT: 51 % (ref 43–77)
Neutro Abs: 4 10*3/uL (ref 1.7–7.7)
Platelets: 285 10*3/uL (ref 150–400)
RBC: 3.54 MIL/uL — ABNORMAL LOW (ref 4.22–5.81)
RDW: 20.6 % — ABNORMAL HIGH (ref 11.5–15.5)
WBC: 7.8 10*3/uL (ref 4.0–10.5)

## 2014-08-12 LAB — COMPREHENSIVE METABOLIC PANEL
ALT: 36 U/L (ref 17–63)
AST: 30 U/L (ref 15–41)
Albumin: 2.6 g/dL — ABNORMAL LOW (ref 3.5–5.0)
Alkaline Phosphatase: 49 U/L (ref 38–126)
Anion gap: 7 (ref 5–15)
BILIRUBIN TOTAL: 0.4 mg/dL (ref 0.3–1.2)
BUN: 21 mg/dL — AB (ref 6–20)
CALCIUM: 7.2 mg/dL — AB (ref 8.9–10.3)
CHLORIDE: 108 mmol/L (ref 101–111)
CO2: 26 mmol/L (ref 22–32)
Creatinine, Ser: 1.49 mg/dL — ABNORMAL HIGH (ref 0.61–1.24)
GFR calc Af Amer: 50 mL/min — ABNORMAL LOW (ref >60)
GFR calc non Af Amer: 43 mL/min — ABNORMAL LOW (ref >60)
GLUCOSE: 64 mg/dL — AB (ref 65–99)
Potassium: 4.1 mmol/L (ref 3.5–5.1)
Sodium: 141 mmol/L (ref 135–145)
Total Protein: 5.9 g/dL — ABNORMAL LOW (ref 6.5–8.1)

## 2014-08-14 ENCOUNTER — Encounter (HOSPITAL_COMMUNITY)
Admission: AD | Admit: 2014-08-14 | Discharge: 2014-08-14 | Disposition: A | Discharge status: Home / Self Care | Payer: Medicare Other | Source: Skilled Nursing Facility | Attending: Internal Medicine | Admitting: Internal Medicine

## 2014-08-18 NOTE — Progress Notes (Signed)
Patient ID: [name], male   DOB: [birth year], 81 y.o.   MRN: [id number]                PROGRESS NOTE  DATE:  08/09/2014            FACILITY: [facility]                              LEVEL OF CARE:   SNF   Acute Visit                CHIEF COMPLAINT:  Shortness of breath, decreased O2 sats.     HISTORY OF PRESENT ILLNESS:  This is a 81 year-old man who was admitted to hospital with acute renal failure, felt to be secondary to dehydration, nephrotoxic drugs.  He was rehydrated.  A renal ultrasound showed medical renal disease.  He was started on Flomax, but discharged with a Foley catheter.  He had 800 cc of residual urine.  Was discharged with a creatinine of 2.04.     Noted by the staff to have a low O2 sat in the mid 80s this morning.  He was put on oxygen.    REVIEW OF SYSTEMS:    HEENT:  The patient states he does not have any difficulty swallowing.   CHEST/RESPIRATORY:  No shortness of breath.  No cough.   CARDIAC:  No chest pain.   GI:  He makes reference to an esophageal problem, what sounds like reflux although I do not see anything about that in his past medical history.  No abdominal pain.  No nausea.  No vomiting.   GU:  He has a Foley catheter in place.  Following with Urology.   MUSCULOSKELETAL:  No lower extremity pain.  He is apparently working with Physical Therapy although they find him to be very ataxic, as well, in discussion.    PHYSICAL EXAMINATION:   VITAL SIGNS:     PULSE:  81.     RESPIRATIONS:  20.     02 SATURATIONS:  94% on 2 L.    GENERAL APPEARANCE:  The patient is not in any overt distress.      CHEST/RESPIRATORY:  Mildly decreased expiratory phase.   There is expiratory wheezing.  His work of breathing is normal.  There is no accessory muscle use.   CARDIOVASCULAR:   CARDIAC:  Heart sounds are distant.  His JVP is not elevated.  There are no gallops.      GASTROINTESTINAL:   ABDOMEN:  No masses.  No tenderness.      LIVER/SPLEEN/KIDNEYS:  No liver, no spleen.   GENITOURINARY:   BLADDER:  No suprapubic or costovertebral angle tenderness.   CIRCULATION:   EDEMA/VARICOSITIES:  Extremities:  No evidence of a DVT.  There is minimal edema.     PSYCHIATRIC:   MENTAL STATUS:    He is orientated to the year, month, place, city.  He was able to state the President and, with prompting, even named the [employer].  He was able to tell me his address, his date of birth, but had trouble coming up with his exact age.  Also had trouble telling me when he exactly came here and from where.       ASSESSMENT/PLAN:                     COPD acute.   He does have COPD  listed on his past medical history.  I am going to give him routine nebulizers and get a chest x-ray.    Acute on chronic renal failure.  His BUN and creatinine from 08/07/2014 were 23 and 1.48, respectively, versus 42 and 2.04 on 08/04/2014.    This represents a continued improvement.    Hypercalcemia in the hospital.  This is resolved.  His calcium is 7.2.    Hypoalbuminemia with an albumin of 2.5.  Indicative of some degree of protein calorie malnutrition.    Dementia.  I went over this with him today.  I would suspect that this is probably mild to moderate.  He has apparently already been deemed incompetent and somebody is obtaining guardianship.  His function today was even better than what I expected.     CPT CODE: 32549

## 2014-08-19 LAB — BASIC METABOLIC PANEL
ANION GAP: 7 (ref 5–15)
BUN: 23 mg/dL — AB (ref 6–20)
CALCIUM: 8.8 mg/dL — AB (ref 8.9–10.3)
CHLORIDE: 105 mmol/L (ref 101–111)
CO2: 27 mmol/L (ref 22–32)
Creatinine, Ser: 1.37 mg/dL — ABNORMAL HIGH (ref 0.61–1.24)
GFR calc Af Amer: 55 mL/min — ABNORMAL LOW (ref >60)
GFR, EST NON AFRICAN AMERICAN: 47 mL/min — AB (ref >60)
Glucose, Bld: 75 mg/dL (ref 65–99)
Potassium: 4 mmol/L (ref 3.5–5.1)
Sodium: 139 mmol/L (ref 135–145)

## 2014-08-19 LAB — CBC WITH DIFFERENTIAL/PLATELET
BASOS ABS: 0.2 10*3/uL — AB (ref 0.0–0.1)
Basophils Relative: 2 % — ABNORMAL HIGH (ref 0–1)
Eosinophils Absolute: 0.7 10*3/uL (ref 0.0–0.7)
Eosinophils Relative: 9 % — ABNORMAL HIGH (ref 0–5)
HCT: 32.9 % — ABNORMAL LOW (ref 39.0–52.0)
Hemoglobin: 10 g/dL — ABNORMAL LOW (ref 13.0–17.0)
LYMPHS ABS: 3 10*3/uL (ref 0.7–4.0)
Lymphocytes Relative: 41 % (ref 12–46)
MCH: 25.8 pg — ABNORMAL LOW (ref 26.0–34.0)
MCHC: 30.4 g/dL (ref 30.0–36.0)
MCV: 85 fL (ref 78.0–100.0)
MONO ABS: 0.6 10*3/uL (ref 0.1–1.0)
Monocytes Relative: 8 % (ref 3–12)
Neutro Abs: 3 10*3/uL (ref 1.7–7.7)
Neutrophils Relative %: 41 % — ABNORMAL LOW (ref 43–77)
Platelets: 327 10*3/uL (ref 150–400)
RBC: 3.87 MIL/uL — ABNORMAL LOW (ref 4.22–5.81)
RDW: 21.2 % — AB (ref 11.5–15.5)
WBC: 7.4 10*3/uL (ref 4.0–10.5)

## 2014-08-28 ENCOUNTER — Encounter (HOSPITAL_COMMUNITY)
Admission: RE | Admit: 2014-08-28 | Discharge: 2014-08-28 | Disposition: A | Discharge status: Home / Self Care | Payer: Medicare Other | Source: Skilled Nursing Facility | Attending: Internal Medicine | Admitting: Internal Medicine

## 2014-08-28 LAB — CBC
HCT: 34.2 % — ABNORMAL LOW (ref 39.0–52.0)
Hemoglobin: 10.5 g/dL — ABNORMAL LOW (ref 13.0–17.0)
MCH: 26.7 pg (ref 26.0–34.0)
MCHC: 30.7 g/dL (ref 30.0–36.0)
MCV: 87 fL (ref 78.0–100.0)
PLATELETS: 265 10*3/uL (ref 150–400)
RBC: 3.93 MIL/uL — AB (ref 4.22–5.81)
RDW: 20.7 % — ABNORMAL HIGH (ref 11.5–15.5)
WBC: 7.4 10*3/uL (ref 4.0–10.5)

## 2014-08-28 LAB — BASIC METABOLIC PANEL
Anion gap: 7 (ref 5–15)
BUN: 23 mg/dL — ABNORMAL HIGH (ref 6–20)
CHLORIDE: 107 mmol/L (ref 101–111)
CO2: 28 mmol/L (ref 22–32)
Calcium: 8.8 mg/dL — ABNORMAL LOW (ref 8.9–10.3)
Creatinine, Ser: 1.37 mg/dL — ABNORMAL HIGH (ref 0.61–1.24)
GFR calc Af Amer: 55 mL/min — ABNORMAL LOW (ref >60)
GFR, EST NON AFRICAN AMERICAN: 47 mL/min — AB (ref >60)
Glucose, Bld: 84 mg/dL (ref 65–99)
POTASSIUM: 4.1 mmol/L (ref 3.5–5.1)
Sodium: 142 mmol/L (ref 135–145)

## 2014-09-25 ENCOUNTER — Non-Acute Institutional Stay (SKILLED_NURSING_FACILITY): Payer: Medicare Other | Admitting: Internal Medicine

## 2014-09-25 DIAGNOSIS — E1122 Type 2 diabetes mellitus with diabetic chronic kidney disease: Secondary | ICD-10-CM

## 2014-09-25 DIAGNOSIS — E1121 Type 2 diabetes mellitus with diabetic nephropathy: Secondary | ICD-10-CM

## 2014-09-25 DIAGNOSIS — K2100 Gastro-esophageal reflux disease with esophagitis, without bleeding: Secondary | ICD-10-CM

## 2014-09-25 DIAGNOSIS — N183 Chronic kidney disease, stage 3 unspecified: Secondary | ICD-10-CM

## 2014-09-26 ENCOUNTER — Inpatient Hospital Stay
Admission: RE | Admit: 2014-09-26 | Discharge: 2014-10-16 | Disposition: A | Discharge status: Home / Self Care | Payer: Medicare Other | Source: Ambulatory Visit | Attending: Internal Medicine | Admitting: Internal Medicine

## 2014-09-29 NOTE — Progress Notes (Signed)
Patient ID: [name], male   DOB: 1933-11-05, 81 y.o.   MRN: [id number]               PROGRESS NOTE  DATE:  09/25/2014         FACILITY: [facility]                   LEVEL OF CARE:   SNF   Acute Visit                        CHIEF COMPLAINT:  Review of medical issues, gastroesophageal reflux.    HISTORY OF PRESENT ILLNESS:  This is a patient who came to [place] after a stay at [facility] from 08/01/2014 through 08/04/2014.  This was largely prompted by acute on chronic renal failure secondary to type 2 diabetes with chronic renal failure stage III.  This largely was felt to be secondary to a UTI.  He was discharged to [place] with a Foley catheter.  He followed up with Urology in early August.  I believe the Foley catheter was removed.  He seems to have done well.    He is complaining of what sounds like gastroesophageal reflux with acid up into his mouth and a bitter taste which is worse at night.  He has a history of coronary artery disease with an inferior MI in 2000 requiring, I believe, a stent.  However, he is not complaining of exertional chest pain.    CURRENT MEDICATIONS:  Medication list is reviewed.       Aspirin 81 q.d.      Catapres 0.1 b.i.d.       Cozaar 50 q.d.      Iron 325 b.i.d.       Flomax 0.4 q.h.s.      Glipizide 5 mg once a day.    Melatonin 6 mg at bedtime.      Metformin 500 extended release once a day.    Namenda 10 b.i.d.      Nitrostat p.r.n.    Senokot 8.6 once a day.    Synthroid 112 daily.     Pepcid 20 mg once a day.      LABORATORY DATA:   Last lab work on 08/28/2014:    BUN 23, creatinine 1.37, which is stable for him.    An estimated GFR was 47, giving him stage III chronic renal failure.    Hemoglobin 10.5, also stable, probably secondary to chronic renal insufficiency.  Platelet count 265, white count 7.4.     REVIEW OF SYSTEMS:    CHEST/RESPIRATORY:  No shortness of breath.   CARDIAC:  No chest pain.  No  exertional chest pain or discomfort.   GI:  As noted, reflux type symptoms.  No chest pain.  No heartburn.  No nausea or vomiting.   GU:  No dysuria.     PHYSICAL EXAMINATION:   GENERAL APPEARANCE:  The patient is not in any distress.       CHEST/RESPIRATORY:  Clear air entry bilaterally.    CARDIOVASCULAR:   CARDIAC:  Heart sounds are soft.  There are no murmurs.   No S3.  No elevation of his jugular venous pressure.   GASTROINTESTINAL:   ABDOMEN:  No masses.    LIVER/SPLEEN/KIDNEYS:  No liver, no spleen.  No tenderness.     GENITOURINARY:   BLADDER:  Not distended.      CIRCULATION:   EDEMA/VARICOSITIES:  Extremities:  No edema.      ASSESSMENT/PLAN:                  Gastroesophageal reflux disease.  The patient states he saw a gastroenterologist in [place] who diagnosed this.  It would appear that he had an endoscopy in 2012, which was normal.   Colonoscopy at the time showed multiple diverticula.    History of diabetic-induced chronic renal failure.  This is stage III and stable.    Anemia of chronic renal failure, which also seems stable although I note his RDW is 20.7.  White count and platelet count are normal.    Urinary retention.  This appears to have resolved with Flomax and treatment of a UTI.    I am going to stop his ferrous sulfate, which could be contributing to this.  With regards to his gastroesophageal reflux type symptoms, he is already on Pepcid.  I will leave him his Tums that he requests.    He had a hemoglobin A1c in July at 7.    TSH in July was also at 0.825, which is stable.     CPT CODE: 97353

## 2014-10-16 ENCOUNTER — Non-Acute Institutional Stay: Payer: Medicare Other | Admitting: Internal Medicine

## 2014-10-16 DIAGNOSIS — E1121 Type 2 diabetes mellitus with diabetic nephropathy: Secondary | ICD-10-CM | POA: Diagnosis not present

## 2014-10-16 DIAGNOSIS — E1122 Type 2 diabetes mellitus with diabetic chronic kidney disease: Secondary | ICD-10-CM

## 2014-10-16 DIAGNOSIS — N183 Chronic kidney disease, stage 3 (moderate): Secondary | ICD-10-CM | POA: Diagnosis not present

## 2014-10-17 NOTE — Progress Notes (Signed)
Patient ID: [name], male   DOB: Dec 25, 1933, 81 y.o.   MRN: [id number]                PROGRESS NOTE  DATE:  10/16/2014     FACILITY: [facility]                 LEVEL OF CARE:   SNF   Acute Visit/Discharge Visit   CHIEF COMPLAINT:  Pre-discharge review.      HISTORY OF PRESENT ILLNESS:  This is a patient who came to [place] after a stay at [facility] in July.  This was prompted by acute on chronic renal failure secondary to type 2 diabetes, chronic renal failure stage III, urinary tract infection, and he came to [place] with a Foley catheter.    He followed up with Urology in August.  The Foley catheter was discontinued.  He seems to have done well.  There are no voiding difficulties.     The patient is a diabetic, on glipizide and metformin.  His blood sugars are really quite well controlled at 102 and 106 yesterday at 6 a.m. and 9 p.m.    He is being discharged today to [facility] assisted living.  Apparently, this is for financial reasons.    CURRENT MEDICATIONS:  Discharge medications include:     Pepcid 20 once a day.     Tums 500 three times a day.      Aspirin 81 q.d.      Catapres 0.1 twice a day.      Cozaar 50 once a day.     Ferrous sulfate 325 b.i.d.     Flomax 0.4 q.h.s.      Glucotrol 5 q.d.      Melatonin 6 mg at bedtime.     Metformin extended release 500 once a day.      Namenda 10 mg b.i.d.       Nitroglycerin 0.4 sublingual daily p.r.n.        LABORATORY DATA:   The patient's last lab work was on 08/28/2014:    Sodium 142, potassium 4.1, CO2 of 28, BUN 23, creatinine 1.37.     White count 7.4, hemoglobin 10.5 (stable).       REVIEW OF SYSTEMS:    CHEST/RESPIRATORY:  The patient is not complaining of shortness of breath.      CARDIAC:  No chest pain.   GI:  No nausea, vomiting, or diarrhea.          GU:  He completely denies voiding difficulties.      PHYSICAL EXAMINATION:   GENERAL APPEARANCE:  The patient does not look to be  in any distress.   He is in a wheelchair.   CHEST/RESPIRATORY:  Clear air entry bilaterally.    CARDIOVASCULAR:   CARDIAC:  Heart sounds are normal.  His JVP is not elevated.   GASTROINTESTINAL:   ABDOMEN:  Soft.     LIVER/SPLEEN/KIDNEYS:  No liver, no spleen.  No tenderness.      GENITOURINARY:   BLADDER:  Not obviously enlarged at the bedside.     CIRCULATION:   EDEMA/VARICOSITIES:  Extremities:  Some edema, which I think is mostly dependent/venous stasis.      ASSESSMENT/PLAN:                  Diabetic renal failure stage III.  This is stable.  He probably should have this followed up with his primary physician, who is Dr. Karie Kirks.  Alzheimer's disease.  He is on Namenda.  I did not think this was severe.  However, he has apparently been declared incompetent and has guardians.    Type 2 diabetes.   This is very stable.     The patient will go to assisted living with a standard wheelchair, although he looks fairly stable on his feet.      CPT CODE: 83382

## 2015-06-09 ENCOUNTER — Encounter (HOSPITAL_COMMUNITY): Payer: Self-pay

## 2015-06-09 ENCOUNTER — Observation Stay (HOSPITAL_COMMUNITY)
Admission: EM | Admit: 2015-06-09 | Discharge: 2015-06-12 | Disposition: A | Discharge status: Home / Self Care | Payer: Medicare Other | Attending: Internal Medicine | Admitting: Internal Medicine

## 2015-06-09 ENCOUNTER — Emergency Department (HOSPITAL_COMMUNITY): Payer: Medicare Other

## 2015-06-09 DIAGNOSIS — I252 Old myocardial infarction: Secondary | ICD-10-CM | POA: Insufficient documentation

## 2015-06-09 DIAGNOSIS — N179 Acute kidney failure, unspecified: Secondary | ICD-10-CM | POA: Diagnosis present

## 2015-06-09 DIAGNOSIS — F13239 Sedative, hypnotic or anxiolytic dependence with withdrawal, unspecified: Secondary | ICD-10-CM | POA: Diagnosis present

## 2015-06-09 DIAGNOSIS — E785 Hyperlipidemia, unspecified: Secondary | ICD-10-CM | POA: Insufficient documentation

## 2015-06-09 DIAGNOSIS — Z7984 Long term (current) use of oral hypoglycemic drugs: Secondary | ICD-10-CM | POA: Insufficient documentation

## 2015-06-09 DIAGNOSIS — Z87891 Personal history of nicotine dependence: Secondary | ICD-10-CM | POA: Insufficient documentation

## 2015-06-09 DIAGNOSIS — E039 Hypothyroidism, unspecified: Secondary | ICD-10-CM | POA: Diagnosis present

## 2015-06-09 DIAGNOSIS — Z7982 Long term (current) use of aspirin: Secondary | ICD-10-CM | POA: Insufficient documentation

## 2015-06-09 DIAGNOSIS — E1121 Type 2 diabetes mellitus with diabetic nephropathy: Secondary | ICD-10-CM | POA: Diagnosis present

## 2015-06-09 DIAGNOSIS — E860 Dehydration: Principal | ICD-10-CM | POA: Insufficient documentation

## 2015-06-09 DIAGNOSIS — G9340 Encephalopathy, unspecified: Secondary | ICD-10-CM | POA: Diagnosis present

## 2015-06-09 DIAGNOSIS — I10 Essential (primary) hypertension: Secondary | ICD-10-CM | POA: Insufficient documentation

## 2015-06-09 DIAGNOSIS — F0392 Unspecified dementia, unspecified severity, with psychotic disturbance: Secondary | ICD-10-CM | POA: Diagnosis present

## 2015-06-09 DIAGNOSIS — F05 Delirium due to known physiological condition: Secondary | ICD-10-CM | POA: Diagnosis present

## 2015-06-09 DIAGNOSIS — F13939 Sedative, hypnotic or anxiolytic use, unspecified with withdrawal, unspecified: Secondary | ICD-10-CM | POA: Diagnosis present

## 2015-06-09 DIAGNOSIS — J9611 Chronic respiratory failure with hypoxia: Secondary | ICD-10-CM | POA: Diagnosis present

## 2015-06-09 DIAGNOSIS — R4182 Altered mental status, unspecified: Secondary | ICD-10-CM | POA: Diagnosis present

## 2015-06-09 DIAGNOSIS — E119 Type 2 diabetes mellitus without complications: Secondary | ICD-10-CM | POA: Diagnosis not present

## 2015-06-09 DIAGNOSIS — F0390 Unspecified dementia without behavioral disturbance: Secondary | ICD-10-CM | POA: Diagnosis present

## 2015-06-09 DIAGNOSIS — J449 Chronic obstructive pulmonary disease, unspecified: Secondary | ICD-10-CM | POA: Diagnosis not present

## 2015-06-09 DIAGNOSIS — R410 Disorientation, unspecified: Secondary | ICD-10-CM | POA: Diagnosis present

## 2015-06-09 DIAGNOSIS — R778 Other specified abnormalities of plasma proteins: Secondary | ICD-10-CM | POA: Diagnosis present

## 2015-06-09 DIAGNOSIS — R7989 Other specified abnormal findings of blood chemistry: Secondary | ICD-10-CM | POA: Diagnosis present

## 2015-06-09 DIAGNOSIS — N183 Chronic kidney disease, stage 3 unspecified: Secondary | ICD-10-CM | POA: Diagnosis present

## 2015-06-09 LAB — COMPREHENSIVE METABOLIC PANEL
ALT: 18 U/L (ref 17–63)
AST: 21 U/L (ref 15–41)
Albumin: 4.2 g/dL (ref 3.5–5.0)
Alkaline Phosphatase: 52 U/L (ref 38–126)
Anion gap: 10 (ref 5–15)
BUN: 38 mg/dL — AB (ref 6–20)
CO2: 27 mmol/L (ref 22–32)
CREATININE: 1.6 mg/dL — AB (ref 0.61–1.24)
Calcium: 9.4 mg/dL (ref 8.9–10.3)
Chloride: 101 mmol/L (ref 101–111)
GFR calc Af Amer: 45 mL/min — ABNORMAL LOW (ref >60)
GFR, EST NON AFRICAN AMERICAN: 39 mL/min — AB (ref >60)
Glucose, Bld: 174 mg/dL — ABNORMAL HIGH (ref 65–99)
Potassium: 4.2 mmol/L (ref 3.5–5.1)
SODIUM: 138 mmol/L (ref 135–145)
Total Bilirubin: 0.5 mg/dL (ref 0.3–1.2)
Total Protein: 8.2 g/dL — ABNORMAL HIGH (ref 6.5–8.1)

## 2015-06-09 LAB — CBC WITH DIFFERENTIAL/PLATELET
Basophils Absolute: 0.1 10*3/uL (ref 0.0–0.1)
Basophils Relative: 1 %
EOS PCT: 4 %
Eosinophils Absolute: 0.4 10*3/uL (ref 0.0–0.7)
HCT: 48.6 % (ref 39.0–52.0)
Hemoglobin: 16.1 g/dL (ref 13.0–17.0)
Lymphocytes Relative: 25 %
Lymphs Abs: 2.7 10*3/uL (ref 0.7–4.0)
MCH: 31.3 pg (ref 26.0–34.0)
MCHC: 33.1 g/dL (ref 30.0–36.0)
MCV: 94.4 fL (ref 78.0–100.0)
MONO ABS: 1 10*3/uL (ref 0.1–1.0)
Monocytes Relative: 9 %
NEUTROS PCT: 61 %
Neutro Abs: 6.4 10*3/uL (ref 1.7–7.7)
Platelets: 250 10*3/uL (ref 150–400)
RBC: 5.15 MIL/uL (ref 4.22–5.81)
RDW: 14 % (ref 11.5–15.5)
WBC: 10.5 10*3/uL (ref 4.0–10.5)

## 2015-06-09 LAB — TROPONIN I: TROPONIN I: 0.06 ng/mL — AB (ref <0.031)

## 2015-06-09 LAB — LACTIC ACID, PLASMA: LACTIC ACID, VENOUS: 1.5 mmol/L (ref 0.5–2.0)

## 2015-06-09 NOTE — ED Notes (Signed)
He is real confused and hallucinating. He is staying at [facility] now.  He has changed a lot over the past few days.  Concerned he may have a UTI.

## 2015-06-09 NOTE — ED Notes (Signed)
EDP aware that pt is refusing an in an out.

## 2015-06-09 NOTE — ED Provider Notes (Signed)
CSN: [id number]     Arrival date & time 06/09/15  2117 History  By signing my name below, I, [name], attest that this documentation has been prepared under the direction and in the presence of physician practitioner, Fredia Sorrow, MD. Electronically Signed: [name], Scribe. 06/09/2015. 10:50 PM.    Chief Complaint  Patient presents with  . Altered Mental Status    Patient is a 82 y.o. male presenting with altered mental status. The history is provided by the patient and a relative. No language interpreter was used.  Altered Mental Status Presenting symptoms: confusion   Severity:  Severe Most recent episode:  More than 2 days ago Episode history:  Unable to specify Duration:  3 days Timing:  Constant Progression:  Worsening Chronicity:  New Associated symptoms: no abdominal pain, no fever, no headaches, no nausea, no rash and no vomiting      HPI Comments: LEVEL 5 CAVEAT DUE TO MENTAL STATUS CHANGE [name] is a 82 y.o. male with h/o alcohol abuse, brought in by family members, who presents to the Emergency Department complaining of constant, worsening confusion over the last few days. Per his sister in law, pt has been much more confused than usual recently. She states that pt has not slept much in the past 2 or 3 days. Pt's sister in law reports that pt called her several times today and was increasingly confused each time; she states that pt told her that "Jesus Jovita Gamma is coming to get him tonight" approximately 3 hours ago. Per his sister in law, pt is able to ambulate and his gait has not changed recently. Per his sister in law, pt denies fever, nausea, vomiting, pain, or any other associated symptoms. Pt lives at [facility].  Past Medical History  Diagnosis Date  . Hyperlipidemia   . Hypertension   . Hypothyroid   . COPD (chronic obstructive pulmonary disease) ([place])   . Anemia   . ASCVD (arteriosclerotic cardiovascular disease)      Inferior  myocardial infarction in 08/1998 requiring PCI of the RCA; moderate residual disease in the left anterior descending and first diagonal; ejection fraction of 45%  . Cerebrovascular disease      Right carotid bruit; plaque without stenosis in 2003 and 2005  . Diabetes mellitus     A1c-7.7 in 2004 with diet-controlled; 6.3 and 11/08 on oral medication  . Tobacco abuse     Consumption tapered to 2 packs per week  . Elevated PSA     prior negative prostate biopsy  . Alcohol abuse, in remission   . MI, old   . Urinary retention     07/2014   Past Surgical History  Procedure Laterality Date  . Nasal sinus surgery    . Ulnar nerve repair      right arm  . Dental surgery      Right jaw; continued chronic pain  . Colonoscopy  06/2010    Dr. Laural Golden  . Stents      cardiac stent   Family History  Problem Relation Age of Onset  . Hypertension Mother   . Heart disease Father   . Hyperlipidemia Father   . Hypertension Father   . Diabetes Father    Social History  Substance Use Topics  . Smoking status: Former Smoker -- 1.00 packs/day for 60 years    Types: Cigarettes    Quit date: 04/21/2011  . Smokeless tobacco: Never Used  . Alcohol Use: No  Comment: Former Abuse    Review of Systems  Unable to perform ROS: Mental status change  Constitutional: Negative for fever and chills.  HENT: Negative for rhinorrhea and sore throat.   Eyes: Negative for visual disturbance.  Respiratory: Negative for cough and shortness of breath.   Cardiovascular: Negative for chest pain.  Gastrointestinal: Negative for nausea, vomiting, abdominal pain and diarrhea.  Genitourinary: Negative for dysuria.  Musculoskeletal: Negative for back pain and joint swelling.  Skin: Negative for rash.  Neurological: Negative for headaches.  Hematological: Does not bruise/bleed easily.  Psychiatric/Behavioral: Positive for confusion.  Review of system obtained through the patient's family member.  Allergies   Prednisone  Home Medications   Prior to Admission medications   Medication Sig Start Date End Date Taking? Authorizing Provider  aspirin 81 MG EC tablet Take 81 mg by mouth daily.     Yes Historical Provider, MD  calcium carbonate (ANTACID CALCIUM) 500 MG chewable tablet Chew 2 tablets by mouth 3 (three) times daily with meals.   Yes Historical Provider, MD  cloNIDine (CATAPRES) 0.1 MG tablet Take 0.1 mg by mouth 2 (two) times daily.  09/22/11  Yes Historical Provider, MD  famotidine (PEPCID) 20 MG tablet Take 20 mg by mouth daily.   Yes Historical Provider, MD  ferrous sulfate 325 (65 FE) MG tablet Take 1 tablet (325 mg total) by mouth 2 (two) times daily with a meal. 08/04/14  Yes Lezlie Octave Black, NP  glipiZIDE (GLUCOTROL) 5 MG tablet Take 5 mg by mouth daily.    Yes Historical Provider, MD  levothyroxine (SYNTHROID, LEVOTHROID) 112 MCG tablet Take 112 mcg by mouth daily before breakfast.   Yes Historical Provider, MD  LORazepam (ATIVAN) 0.5 MG tablet Take 0.5-1 mg by mouth 2 (two) times daily. 1 in the morning and 2 at bedtime   Yes Historical Provider, MD  losartan (COZAAR) 50 MG tablet Take 50 mg by mouth daily.  05/09/14  Yes Historical Provider, MD  Melatonin 3 MG CAPS Take 6 mg by mouth at bedtime.   Yes Historical Provider, MD  memantine (NAMENDA) 10 MG tablet Take 10 mg by mouth 2 (two) times daily. 07/21/14  Yes Historical Provider, MD  metFORMIN (GLUCOPHAGE) 500 MG tablet Take 500 mg by mouth daily.   Yes Historical Provider, MD  nitroGLYCERIN (NITROSTAT) 0.4 MG SL tablet Place 0.4 mg under the tongue every 5 (five) minutes as needed for chest pain.    Yes Historical Provider, MD  tamsulosin (FLOMAX) 0.4 MG CAPS capsule Take 1 capsule (0.4 mg total) by mouth at bedtime. 08/04/14  Yes Radene Gunning, NP  ALPRAZolam Duanne Moron) 1 MG tablet Take one tablet by mouth in the morning and two tablets at bedtime for anxiety/rest Patient not taking: Reported on 06/09/2015 08/07/14   Tiffany L Reed, DO    BP 124/83 mmHg  Pulse 96  Temp(Src) 97.5 F (36.4 C) (Oral)  Resp 22  Ht 5\' 11"  (1.803 m)  Wt 84.369 kg  BMI 25.95 kg/m2  SpO2 89% Physical Exam  Constitutional: He appears well-developed and well-nourished. No distress.  HENT:  Head: Normocephalic and atraumatic.  Eyes: Conjunctivae and EOM are normal.  Neck: Neck supple. No tracheal deviation present.  Cardiovascular: Normal rate, regular rhythm and normal heart sounds.   Pulmonary/Chest: Effort normal. No respiratory distress.  Abdominal: Soft. Bowel sounds are normal. There is no tenderness.  Musculoskeletal: Normal range of motion. He exhibits no edema.  Neurological: He is alert. No cranial nerve deficit.  He exhibits normal muscle tone. Coordination normal.  Skin: Skin is warm and dry.  Psychiatric: He has a normal mood and affect. His behavior is normal.  Nursing note and vitals reviewed.   ED Course  Procedures (including critical care time)  DIAGNOSTIC STUDIES: Oxygen Saturation is 94% on RA, adequate by my interpretation.    COORDINATION OF CARE: 10:50 PM Discussed treatment plan with pt's sister in law at bedside and she agreed to plan.  Medications - No data to display  Results for orders placed or performed during the hospital encounter of 06/09/15  Comprehensive metabolic panel  Result Value Ref Range   Sodium 138 135 - 145 mmol/L   Potassium 4.2 3.5 - 5.1 mmol/L   Chloride 101 101 - 111 mmol/L   CO2 27 22 - 32 mmol/L   Glucose, Bld 174 (H) 65 - 99 mg/dL   BUN 38 (H) 6 - 20 mg/dL   Creatinine, Ser 1.60 (H) 0.61 - 1.24 mg/dL   Calcium 9.4 8.9 - 10.3 mg/dL   Total Protein 8.2 (H) 6.5 - 8.1 g/dL   Albumin 4.2 3.5 - 5.0 g/dL   AST 21 15 - 41 U/L   ALT 18 17 - 63 U/L   Alkaline Phosphatase 52 38 - 126 U/L   Total Bilirubin 0.5 0.3 - 1.2 mg/dL   GFR calc non Af Amer 39 (L) >60 mL/min   GFR calc Af Amer 45 (L) >60 mL/min   Anion gap 10 5 - 15  Troponin I  Result Value Ref Range   Troponin I 0.06  (H) <0.031 ng/mL  Lactic acid, plasma  Result Value Ref Range   Lactic Acid, Venous 1.5 0.5 - 2.0 mmol/L  CBC with Differential  Result Value Ref Range   WBC 10.5 4.0 - 10.5 K/uL   RBC 5.15 4.22 - 5.81 MIL/uL   Hemoglobin 16.1 13.0 - 17.0 g/dL   HCT 48.6 39.0 - 52.0 %   MCV 94.4 78.0 - 100.0 fL   MCH 31.3 26.0 - 34.0 pg   MCHC 33.1 30.0 - 36.0 g/dL   RDW 14.0 11.5 - 15.5 %   Platelets 250 150 - 400 K/uL   Neutrophils Relative % 61 %   Neutro Abs 6.4 1.7 - 7.7 K/uL   Lymphocytes Relative 25 %   Lymphs Abs 2.7 0.7 - 4.0 K/uL   Monocytes Relative 9 %   Monocytes Absolute 1.0 0.1 - 1.0 K/uL   Eosinophils Relative 4 %   Eosinophils Absolute 0.4 0.0 - 0.7 K/uL   Basophils Relative 1 %   Basophils Absolute 0.1 0.0 - 0.1 K/uL   Dg Chest 2 View  06/09/2015  CLINICAL DATA:  Altered mental status EXAM: CHEST  2 VIEW COMPARISON:  08/09/2014 chest radiograph. FINDINGS: Stable cardiomediastinal silhouette with normal heart size. No pneumothorax. No pleural effusion. Hyperinflated lungs. No pulmonary edema. No acute consolidative airspace disease. IMPRESSION: Hyperinflated lungs, suggesting obstructive lung disease. Otherwise no active disease in the chest. Electronically Signed   By: Ilona Sorrel M.D.   On: 06/09/2015 23:28   Ct Head Wo Contrast  06/09/2015  CLINICAL DATA:  Acute onset of confusion and hallucinations. Altered mental status. Initial encounter. EXAM: CT HEAD WITHOUT CONTRAST TECHNIQUE: Contiguous axial images were obtained from the base of the skull through the vertex without intravenous contrast. COMPARISON:  CT of the head performed 08/02/2014 FINDINGS: There is no evidence of acute infarction, mass lesion, or intra- or extra-axial hemorrhage on CT. Prominence of  the ventricles and sulci reflects mild cortical volume loss. Mild cerebellar atrophy is noted. Scattered periventricular and subcortical white matter change likely reflects small vessel ischemic microangiopathy. A small  chronic lacunar infarct is noted at the left basal ganglia. The brainstem and fourth ventricle are within normal limits. The cerebral hemispheres demonstrate grossly normal gray-white differentiation. No mass effect or midline shift is seen. There is no evidence of fracture; visualized osseous structures are unremarkable in appearance. The orbits are within normal limits. A small mucus retention cyst or polyp is noted at the right maxillary sinus. The remaining paranasal sinuses and mastoid air cells are well-aerated. No significant soft tissue abnormalities are seen. IMPRESSION: 1. No acute intracranial pathology seen on CT. 2. Mild cortical volume loss and scattered small vessel ischemic microangiopathy. 3. Small chronic lacunar infarct at the left basal ganglia. 4. Small mucus retention cyst or polyp at the right maxillary sinus. Electronically Signed   By: Garald Balding M.D.   On: 06/09/2015 23:31   EKG not crossing over from MUSE   ED ECG REPORT   Date: 06/10/2015  Rate: 97  Rhythm: normal sinus rhythm  QRS Axis: normal  Intervals: normal  ST/T Wave abnormalities: nonspecific ST/T changes  Conduction Disutrbances:right bundle branch block  Narrative Interpretation:   Old EKG Reviewed: unchanged Inferior Q waves suggestive of old inferior infarct. I have personally reviewed the EKG tracing and agree with the computerized printout as noted.    MDM   Final diagnoses:  Confusion  Dehydration    Patient brought in by family members from assisted living. Patient stays at [facility]. Patient has been confused more than usual for about 3 days. Family states this sometimes happens when he has a urinary tract infection. Family states that there has been no other symptoms other than there perhaps a complaint of chest pain on Monday. And states that assisted living gave him a nitroglycerin.  Workup here tonight without any acute findings urinalysis still pending however troponin was slightly  elevated at 0.06.  Patient will have a repeat troponin at 1:00 in the morning. Amer still waiting for urinalysis. Patient refuses to have an in and out cath so we are hydrating him. And we'll wait until we have a urinalysis to check. Patient's BUN and creatinine was slightly elevated suggestive of some dehydration. Labs otherwise without any significant findings. Chest x-rays negative for any evidence of pneumonia or pulmonary edema or pneumothorax.    I personally performed the services described in this documentation, which was scribed in my presence. The recorded information has been reviewed and is accurate.     Fredia Sorrow, MD 06/10/15 [phone]

## 2015-06-09 NOTE — ED Notes (Signed)
Pt adamantly refusing an in and out cath. EDP aware and orders for a 250 ml bolus of NS was received and maintenance fluid at 52ml/hr after the bolus.

## 2015-06-10 ENCOUNTER — Encounter (HOSPITAL_COMMUNITY): Payer: Self-pay

## 2015-06-10 DIAGNOSIS — I10 Essential (primary) hypertension: Secondary | ICD-10-CM

## 2015-06-10 DIAGNOSIS — R410 Disorientation, unspecified: Secondary | ICD-10-CM | POA: Diagnosis present

## 2015-06-10 DIAGNOSIS — F0390 Unspecified dementia without behavioral disturbance: Secondary | ICD-10-CM | POA: Diagnosis not present

## 2015-06-10 DIAGNOSIS — F0392 Unspecified dementia, unspecified severity, with psychotic disturbance: Secondary | ICD-10-CM | POA: Diagnosis present

## 2015-06-10 DIAGNOSIS — F05 Delirium due to known physiological condition: Secondary | ICD-10-CM

## 2015-06-10 DIAGNOSIS — F13939 Sedative, hypnotic or anxiolytic use, unspecified with withdrawal, unspecified: Secondary | ICD-10-CM | POA: Diagnosis present

## 2015-06-10 DIAGNOSIS — R7989 Other specified abnormal findings of blood chemistry: Secondary | ICD-10-CM | POA: Diagnosis not present

## 2015-06-10 DIAGNOSIS — F13239 Sedative, hypnotic or anxiolytic dependence with withdrawal, unspecified: Secondary | ICD-10-CM | POA: Diagnosis present

## 2015-06-10 LAB — URINALYSIS, ROUTINE W REFLEX MICROSCOPIC
BILIRUBIN URINE: NEGATIVE
Glucose, UA: NEGATIVE mg/dL
Ketones, ur: NEGATIVE mg/dL
Leukocytes, UA: NEGATIVE
NITRITE: NEGATIVE
Protein, ur: 30 mg/dL — AB
SPECIFIC GRAVITY, URINE: 1.025 (ref 1.005–1.030)
pH: 6 (ref 5.0–8.0)

## 2015-06-10 LAB — URINE MICROSCOPIC-ADD ON

## 2015-06-10 LAB — GLUCOSE, CAPILLARY
GLUCOSE-CAPILLARY: 143 mg/dL — AB (ref 65–99)
GLUCOSE-CAPILLARY: 130 mg/dL — AB (ref 65–99)
Glucose-Capillary: 137 mg/dL — ABNORMAL HIGH (ref 65–99)
Glucose-Capillary: 146 mg/dL — ABNORMAL HIGH (ref 65–99)

## 2015-06-10 LAB — TROPONIN I
TROPONIN I: 0.08 ng/mL — AB (ref <0.031)
TROPONIN I: 0.09 ng/mL — AB (ref <0.031)
Troponin I: 0.08 ng/mL — ABNORMAL HIGH (ref <0.031)
Troponin I: 0.11 ng/mL — ABNORMAL HIGH (ref <0.031)

## 2015-06-10 LAB — MRSA PCR SCREENING: MRSA BY PCR: NEGATIVE

## 2015-06-10 MED ORDER — HEPARIN SODIUM (PORCINE) 5000 UNIT/ML IJ SOLN
5000.0000 [IU] | Freq: Three times a day (TID) | INTRAMUSCULAR | Status: DC
  Administered 2015-06-10 – 2015-06-12 (×7): 5000 [IU] via SUBCUTANEOUS
  Filled 2015-06-10 (×7): qty 1

## 2015-06-10 MED ORDER — SODIUM CHLORIDE 0.9% FLUSH
3.0000 mL | Freq: Two times a day (BID) | INTRAVENOUS | Status: DC
  Administered 2015-06-10 – 2015-06-12 (×3): 3 mL via INTRAVENOUS

## 2015-06-10 MED ORDER — HEPARIN BOLUS VIA INFUSION
4000.0000 [IU] | Freq: Once | INTRAVENOUS | Status: AC
  Administered 2015-06-10: 4000 [IU] via INTRAVENOUS

## 2015-06-10 MED ORDER — MEMANTINE HCL 10 MG PO TABS
10.0000 mg | ORAL_TABLET | Freq: Two times a day (BID) | ORAL | Status: DC
  Administered 2015-06-10 – 2015-06-12 (×5): 10 mg via ORAL
  Filled 2015-06-10 (×5): qty 1

## 2015-06-10 MED ORDER — FAMOTIDINE 20 MG PO TABS
20.0000 mg | ORAL_TABLET | Freq: Every day | ORAL | Status: DC
  Administered 2015-06-10 – 2015-06-12 (×3): 20 mg via ORAL
  Filled 2015-06-10 (×3): qty 1

## 2015-06-10 MED ORDER — DEXTROSE 5 % IV SOLN
1.0000 g | INTRAVENOUS | Status: DC
  Filled 2015-06-10 (×2): qty 10

## 2015-06-10 MED ORDER — LEVOTHYROXINE SODIUM 112 MCG PO TABS
112.0000 ug | ORAL_TABLET | Freq: Every day | ORAL | Status: DC
  Administered 2015-06-10 – 2015-06-12 (×3): 112 ug via ORAL
  Filled 2015-06-10 (×3): qty 1

## 2015-06-10 MED ORDER — HEPARIN (PORCINE) IN NACL 100-0.45 UNIT/ML-% IJ SOLN
1100.0000 [IU]/h | INTRAMUSCULAR | Status: DC
  Administered 2015-06-10: 1100 [IU]/h via INTRAVENOUS
  Filled 2015-06-10: qty 250

## 2015-06-10 MED ORDER — INSULIN ASPART 100 UNIT/ML ~~LOC~~ SOLN
0.0000 [IU] | Freq: Three times a day (TID) | SUBCUTANEOUS | Status: DC
  Administered 2015-06-10 – 2015-06-12 (×7): 1 [IU] via SUBCUTANEOUS

## 2015-06-10 MED ORDER — INSULIN ASPART 100 UNIT/ML ~~LOC~~ SOLN: 0.0000 [IU] | Freq: Every day | SUBCUTANEOUS | Status: DC

## 2015-06-10 MED ORDER — TAMSULOSIN HCL 0.4 MG PO CAPS
0.4000 mg | ORAL_CAPSULE | Freq: Every day | ORAL | Status: DC
  Administered 2015-06-10 – 2015-06-11 (×2): 0.4 mg via ORAL
  Filled 2015-06-10 (×2): qty 1

## 2015-06-10 MED ORDER — LORAZEPAM 1 MG PO TABS
1.0000 mg | ORAL_TABLET | Freq: Every evening | ORAL | Status: DC | PRN
  Administered 2015-06-10 (×2): 1 mg via ORAL
  Filled 2015-06-10 (×4): qty 1

## 2015-06-10 MED ORDER — ASPIRIN EC 81 MG PO TBEC
81.0000 mg | DELAYED_RELEASE_TABLET | Freq: Every day | ORAL | Status: DC
  Administered 2015-06-10 – 2015-06-12 (×3): 81 mg via ORAL
  Filled 2015-06-10 (×3): qty 1

## 2015-06-10 MED ORDER — LORAZEPAM 1 MG PO TABS
1.0000 mg | ORAL_TABLET | Freq: Every morning | ORAL | Status: DC
  Administered 2015-06-10 – 2015-06-11 (×2): 1 mg via ORAL
  Filled 2015-06-10: qty 1

## 2015-06-10 MED ORDER — SODIUM CHLORIDE 0.9 % IV SOLN: INTRAVENOUS | Status: AC

## 2015-06-10 NOTE — ED Notes (Signed)
Pt resting with eyes closed.

## 2015-06-10 NOTE — H&P (Signed)
[facility] History and Physical  [name] [id number] DOB: 1933/01/28    PCP:   Robert Bellow, MD   Chief Complaint:  Brought in for altered mental status for 3 days.   HPI: [name] is an 82 y.o. male with hx of HLD, HTN, known CAD, moderate to advanced dementia, assisted living resident, brought in by POV by his sister in law (HCP) as he was found to be more confused the past 3 days.  She said in the past, it means he has a UTI.  He denied to me any chest pain, SOB, nausea, or vomiting.  Evaluation in the ER included a troponin of 0.06 and 0.08, with EKG showing no ischemia, and he was started on IV heparin, and hospitalist was asked to admit him for elevated troponins.  His CT of the head showed no acute process, and his CXR was clear.  In speaking with his step daughter and his sister in laws, he is a DNR.    Rewiew of Systems:  Constitutional: Negative for malaise, fever and chills. No significant weight loss or weight gain Eyes: Negative for eye pain, redness and discharge, diplopia, visual changes, or flashes of light. ENMT: Negative for ear pain, hoarseness, nasal congestion, sinus pressure and sore throat. No headaches; tinnitus, drooling, or problem swallowing. Cardiovascular: Negative for chest pain, palpitations, diaphoresis, dyspnea and peripheral edema. ; No orthopnea, PND Respiratory: Negative for cough, hemoptysis, wheezing and stridor. No pleuritic chestpain. Gastrointestinal: Negative for nausea, vomiting, diarrhea, constipation, abdominal pain, melena, blood in stool, hematemesis, jaundice and rectal bleeding.    Genitourinary: Negative for frequency, dysuria, incontinence,flank pain and hematuria; Musculoskeletal: Negative for back pain and neck pain. Negative for swelling and trauma.;  Skin: . Negative for pruritus, rash, abrasions, bruising and skin lesion.; ulcerations Neuro: Negative for headache, lightheadedness and neck stiffness. Negative  for weakness, altered level of consciousness , altered mental status, extremity weakness, burning feet, involuntary movement, seizure and syncope.  Psych: negative for anxiety, depression, insomnia, tearfulness, panic attacks, hallucinations, paranoia, suicidal or homicidal ideation    Past Medical History  Diagnosis Date  . Hyperlipidemia   . Hypertension   . Hypothyroid   . COPD (chronic obstructive pulmonary disease) ([place])   . Anemia   . ASCVD (arteriosclerotic cardiovascular disease)      Inferior myocardial infarction in 08/1998 requiring PCI of the RCA; moderate residual disease in the left anterior descending and first diagonal; ejection fraction of 45%  . Cerebrovascular disease      Right carotid bruit; plaque without stenosis in 2003 and 2005  . Diabetes mellitus     A1c-7.7 in 2004 with diet-controlled; 6.3 and 11/08 on oral medication  . Tobacco abuse     Consumption tapered to 2 packs per week  . Elevated PSA     prior negative prostate biopsy  . Alcohol abuse, in remission   . MI, old   . Urinary retention     07/2014    Past Surgical History  Procedure Laterality Date  . Nasal sinus surgery    . Ulnar nerve repair      right arm  . Dental surgery      Right jaw; continued chronic pain  . Colonoscopy  06/2010    Dr. Laural Golden  . Stents      cardiac stent    Medications:  HOME MEDS: Prior to Admission medications   Medication Sig Start Date End Date Taking? Authorizing Provider  aspirin 81 MG EC  tablet Take 81 mg by mouth daily.     Yes Historical Provider, MD  calcium carbonate (ANTACID CALCIUM) 500 MG chewable tablet Chew 2 tablets by mouth 3 (three) times daily with meals.   Yes Historical Provider, MD  cloNIDine (CATAPRES) 0.1 MG tablet Take 0.1 mg by mouth 2 (two) times daily.  09/22/11  Yes Historical Provider, MD  famotidine (PEPCID) 20 MG tablet Take 20 mg by mouth daily.   Yes Historical Provider, MD  ferrous sulfate 325 (65 FE) MG tablet Take 1 tablet  (325 mg total) by mouth 2 (two) times daily with a meal. 08/04/14  Yes Lezlie Octave Black, NP  glipiZIDE (GLUCOTROL) 5 MG tablet Take 5 mg by mouth daily.    Yes Historical Provider, MD  levothyroxine (SYNTHROID, LEVOTHROID) 112 MCG tablet Take 112 mcg by mouth daily before breakfast.   Yes Historical Provider, MD  LORazepam (ATIVAN) 0.5 MG tablet Take 0.5-1 mg by mouth 2 (two) times daily. 1 in the morning and 2 at bedtime   Yes Historical Provider, MD  losartan (COZAAR) 50 MG tablet Take 50 mg by mouth daily.  05/09/14  Yes Historical Provider, MD  Melatonin 3 MG CAPS Take 6 mg by mouth at bedtime.   Yes Historical Provider, MD  memantine (NAMENDA) 10 MG tablet Take 10 mg by mouth 2 (two) times daily. 07/21/14  Yes Historical Provider, MD  metFORMIN (GLUCOPHAGE) 500 MG tablet Take 500 mg by mouth daily.   Yes Historical Provider, MD  nitroGLYCERIN (NITROSTAT) 0.4 MG SL tablet Place 0.4 mg under the tongue every 5 (five) minutes as needed for chest pain.    Yes Historical Provider, MD  tamsulosin (FLOMAX) 0.4 MG CAPS capsule Take 1 capsule (0.4 mg total) by mouth at bedtime. 08/04/14  Yes Radene Gunning, NP  ALPRAZolam Duanne Moron) 1 MG tablet Take one tablet by mouth in the morning and two tablets at bedtime for anxiety/rest Patient not taking: Reported on 06/09/2015 08/07/14   Gayland Curry, DO     Allergies:  Allergies  Allergen Reactions  . Prednisone     Doesn't like to take high doses    Social History:   reports that he quit smoking about 4 years ago. His smoking use included Cigarettes. He has a 60 pack-year smoking history. He has never used smokeless tobacco. He reports that he does not drink alcohol or use illicit drugs.  Family History: Family History  Problem Relation Age of Onset  . Hypertension Mother   . Heart disease Father   . Hyperlipidemia Father   . Hypertension Father   . Diabetes Father      Physical Exam: Filed Vitals:   06/10/15 0200 06/10/15 0230 06/10/15 0330 06/10/15  0342  BP: 128/84 107/49 141/85 158/80  Pulse: 72   70  Temp:      TempSrc:      Resp: 18 21 18 18   Height:      Weight:      SpO2: 89%   90%   Blood pressure 158/80, pulse 70, temperature 97.5 F (36.4 C), temperature source Oral, resp. rate 18, height 5\' 11"  (1.803 m), weight 84.369 kg (186 lb), SpO2 90 %.  GEN:  Pleasant patient lying in the stretcher in no acute distress; cooperative with exam. PSYCH:  alert and oriented x4; does not appear anxious or depressed; affect is appropriate. HEENT: Mucous membranes pink and anicteric; PERRLA; EOM intact; no cervical lymphadenopathy nor thyromegaly or carotid bruit; no JVD; There were  no stridor. Neck is very supple. Breasts:: Not examined CHEST WALL: No tenderness CHEST: Normal respiration, clear to auscultation bilaterally.  HEART: Regular rate and rhythm.  There are no murmur, rub, or gallops.   BACK: No kyphosis or scoliosis; no CVA tenderness ABDOMEN: soft and non-tender; no masses, no organomegaly, normal abdominal bowel sounds; no pannus; no intertriginous candida. There is no rebound and no distention. Rectal Exam: Not done EXTREMITIES: No bone or joint deformity; age-appropriate arthropathy of the hands and knees; no edema; no ulcerations.  There is no calf tenderness. Genitalia: not examined PULSES: 2+ and symmetric SKIN: Normal hydration no rash or ulceration CNS: Cranial nerves 2-12 grossly intact no focal lateralizing neurologic deficit.  Speech is fluent; uvula elevated with phonation, facial symmetry and tongue midline. DTR are normal bilaterally, cerebella exam is intact, barbinski is negative and strengths are equaled bilaterally.  No sensory loss.   Labs on Admission:  Basic Metabolic Panel:  Recent Labs Lab 06/09/15 2200  NA 138  K 4.2  CL 101  CO2 27  GLUCOSE 174*  BUN 38*  CREATININE 1.60*  CALCIUM 9.4   Liver Function Tests:  Recent Labs Lab 06/09/15 2200  AST 21  ALT 18  ALKPHOS 52  BILITOT 0.5   PROT 8.2*  ALBUMIN 4.2   CBC:  Recent Labs Lab 06/09/15 2200  WBC 10.5  NEUTROABS 6.4  HGB 16.1  HCT 48.6  MCV 94.4  PLT 250   Cardiac Enzymes:  Recent Labs Lab 06/09/15 2200 06/10/15 0125  TROPONINI 0.06* 0.08*    Radiological Exams on Admission: Dg Chest 2 View  06/09/2015  CLINICAL DATA:  Altered mental status EXAM: CHEST  2 VIEW COMPARISON:  08/09/2014 chest radiograph. FINDINGS: Stable cardiomediastinal silhouette with normal heart size. No pneumothorax. No pleural effusion. Hyperinflated lungs. No pulmonary edema. No acute consolidative airspace disease. IMPRESSION: Hyperinflated lungs, suggesting obstructive lung disease. Otherwise no active disease in the chest. Electronically Signed   By: Ilona Sorrel M.D.   On: 06/09/2015 23:28   Ct Head Wo Contrast  06/09/2015  CLINICAL DATA:  Acute onset of confusion and hallucinations. Altered mental status. Initial encounter. EXAM: CT HEAD WITHOUT CONTRAST TECHNIQUE: Contiguous axial images were obtained from the base of the skull through the vertex without intravenous contrast. COMPARISON:  CT of the head performed 08/02/2014 FINDINGS: There is no evidence of acute infarction, mass lesion, or intra- or extra-axial hemorrhage on CT. Prominence of the ventricles and sulci reflects mild cortical volume loss. Mild cerebellar atrophy is noted. Scattered periventricular and subcortical white matter change likely reflects small vessel ischemic microangiopathy. A small chronic lacunar infarct is noted at the left basal ganglia. The brainstem and fourth ventricle are within normal limits. The cerebral hemispheres demonstrate grossly normal gray-white differentiation. No mass effect or midline shift is seen. There is no evidence of fracture; visualized osseous structures are unremarkable in appearance. The orbits are within normal limits. A small mucus retention cyst or polyp is noted at the right maxillary sinus. The remaining paranasal sinuses  and mastoid air cells are well-aerated. No significant soft tissue abnormalities are seen. IMPRESSION: 1. No acute intracranial pathology seen on CT. 2. Mild cortical volume loss and scattered small vessel ischemic microangiopathy. 3. Small chronic lacunar infarct at the left basal ganglia. 4. Small mucus retention cyst or polyp at the right maxillary sinus. Electronically Signed   By: Garald Balding M.D.   On: 06/09/2015 23:31    EKG: Independently reviewed.    Assessment/Plan  1/  Elevated Troponins:   This is likely due to AKI, or demand ischemia at most.  Would d/c heparin.  Continue with ASA and his meds. Will follow troponins, though it is unlikely that he would be a candidate for any intervention.   2/  AMS:  Family said he has UTI in the past.  Will admit him for IVF and start Rocephin.  Obtain UA via I/O cath.   3/  AKI:  Cr is elevated slightly, will give IVF and follow Cr.  WIll hold his Losartan.  4/  HTN: BP is OK,   Hold Lorsartan due to AKI.      Other plans as per orders. Code Status: DNR.  This was confirmed with HCP tonight.    Orvan Falconer, MD. FACP [facility] Pager [phone] 7pm to 7am.  06/10/2015, 4:36 AM

## 2015-06-10 NOTE — Progress Notes (Signed)
ANTICOAGULATION CONSULT NOTE - Preliminary  Pharmacy Consult for heparin Indication: chest pain/ACS  Allergies  Allergen Reactions  . Prednisone     Doesn't like to take high doses    Patient Measurements: Height: 5\' 11"  (180.3 cm) Weight: 186 lb (84.369 kg) IBW/kg (Calculated) : 75.3 HEPARIN DW (KG): 84.4   Vital Signs: Temp: 97.5 F (36.4 C) (05/27 2132) Temp Source: Oral (05/27 2132) BP: 107/49 mmHg (05/28 0230) Pulse Rate: 72 (05/28 0200)  Labs:  Recent Labs  06/09/15 2200 06/10/15 0125  HGB 16.1  --   HCT 48.6  --   PLT 250  --   CREATININE 1.60*  --   TROPONINI 0.06* 0.08*   Estimated Creatinine Clearance: 39.2 mL/min (by C-G formula based on Cr of 1.6).  Medical History: Past Medical History  Diagnosis Date  . Hyperlipidemia   . Hypertension   . Hypothyroid   . COPD (chronic obstructive pulmonary disease) ([place])   . Anemia   . ASCVD (arteriosclerotic cardiovascular disease)      Inferior myocardial infarction in 08/1998 requiring PCI of the RCA; moderate residual disease in the left anterior descending and first diagonal; ejection fraction of 45%  . Cerebrovascular disease      Right carotid bruit; plaque without stenosis in 2003 and 2005  . Diabetes mellitus     A1c-7.7 in 2004 with diet-controlled; 6.3 and 11/08 on oral medication  . Tobacco abuse     Consumption tapered to 2 packs per week  . Elevated PSA     prior negative prostate biopsy  . Alcohol abuse, in remission   . MI, old   . Urinary retention     07/2014    Medications:  Scheduled:  . heparin  4,000 Units Intravenous Once   Infusions:  . heparin     PRN:  Anti-infectives    None      Assessment: 74 male presented to ED from SNF with AMS.  Tropinin elevated, starting heparin.  No bleeding per RN.   Goal of Therapy:  Heparin level 0.3-0.7 units/ml   Plan:  Give 4000 units bolus x 1 Start heparin infusion at 1100 units/hr Check anti-Xa level in 8 hours and daily while  on heparin Continue to monitor H&H and platelets Preliminary review of pertinent patient information completed.  Forestine Na clinical pharmacist will complete review during morning rounds to assess the patient and finalize treatment regimen.  Nyra Capes, [facility] 06/10/2015,3:04 AM

## 2015-06-10 NOTE — ED Notes (Signed)
Called lab, troponin is running now.

## 2015-06-10 NOTE — Progress Notes (Signed)
Patient seen and examined, database reviewed. Discussed with family members at bedside, all questions answered and updated on plan of care. Patient sent from ALF due to altered mental status for the past 3 days. Family members relate that he usually becomes this way when he has a UTI. Since UA was not available on admission he was empirically placed on Rocephin. All sources of infection are negative including UA and chest x-ray. CT scan of the head does not have acute abnormalities. Upon discussion with family members it appears that his Xanax was recently changed to Ativan and dose was decreased by two thirds. Suspect his altered mental status is due to benzodiazepine withdrawal and dementia with delirium. Will increase his Ativan dose somewhat and see how he responds. Suspect he might be able to return to ALF in 24-48 hours. We'll continue to follow.  Domingo Mend, MD [facility] Pager: [phone]

## 2015-06-11 DIAGNOSIS — G9340 Encephalopathy, unspecified: Secondary | ICD-10-CM | POA: Diagnosis not present

## 2015-06-11 DIAGNOSIS — R778 Other specified abnormalities of plasma proteins: Secondary | ICD-10-CM | POA: Diagnosis present

## 2015-06-11 DIAGNOSIS — F0391 Unspecified dementia with behavioral disturbance: Secondary | ICD-10-CM

## 2015-06-11 DIAGNOSIS — R7989 Other specified abnormal findings of blood chemistry: Secondary | ICD-10-CM

## 2015-06-11 DIAGNOSIS — F13231 Sedative, hypnotic or anxiolytic dependence with withdrawal delirium: Secondary | ICD-10-CM

## 2015-06-11 DIAGNOSIS — N179 Acute kidney failure, unspecified: Secondary | ICD-10-CM | POA: Diagnosis present

## 2015-06-11 DIAGNOSIS — J9611 Chronic respiratory failure with hypoxia: Secondary | ICD-10-CM

## 2015-06-11 DIAGNOSIS — E1121 Type 2 diabetes mellitus with diabetic nephropathy: Secondary | ICD-10-CM | POA: Diagnosis present

## 2015-06-11 DIAGNOSIS — N183 Chronic kidney disease, stage 3 unspecified: Secondary | ICD-10-CM | POA: Diagnosis present

## 2015-06-11 DIAGNOSIS — E038 Other specified hypothyroidism: Secondary | ICD-10-CM

## 2015-06-11 DIAGNOSIS — E119 Type 2 diabetes mellitus without complications: Secondary | ICD-10-CM

## 2015-06-11 LAB — BASIC METABOLIC PANEL
ANION GAP: 8 (ref 5–15)
BUN: 25 mg/dL — ABNORMAL HIGH (ref 6–20)
CALCIUM: 8.6 mg/dL — AB (ref 8.9–10.3)
CO2: 25 mmol/L (ref 22–32)
CREATININE: 1.33 mg/dL — AB (ref 0.61–1.24)
Chloride: 102 mmol/L (ref 101–111)
GFR, EST AFRICAN AMERICAN: 57 mL/min — AB (ref >60)
GFR, EST NON AFRICAN AMERICAN: 49 mL/min — AB (ref >60)
GLUCOSE: 122 mg/dL — AB (ref 65–99)
Potassium: 3.9 mmol/L (ref 3.5–5.1)
Sodium: 135 mmol/L (ref 135–145)

## 2015-06-11 LAB — GLUCOSE, CAPILLARY
GLUCOSE-CAPILLARY: 111 mg/dL — AB (ref 65–99)
Glucose-Capillary: 120 mg/dL — ABNORMAL HIGH (ref 65–99)
Glucose-Capillary: 135 mg/dL — ABNORMAL HIGH (ref 65–99)
Glucose-Capillary: 126 mg/dL — ABNORMAL HIGH (ref 65–99)

## 2015-06-11 LAB — AMMONIA: Ammonia: 31 umol/L (ref 9–35)

## 2015-06-11 LAB — CBC
HCT: 47.8 % (ref 39.0–52.0)
HEMOGLOBIN: 16 g/dL (ref 13.0–17.0)
MCH: 31.1 pg (ref 26.0–34.0)
MCHC: 33.5 g/dL (ref 30.0–36.0)
MCV: 93 fL (ref 78.0–100.0)
PLATELETS: 218 10*3/uL (ref 150–400)
RBC: 5.14 MIL/uL (ref 4.22–5.81)
RDW: 13.8 % (ref 11.5–15.5)
WBC: 10.5 10*3/uL (ref 4.0–10.5)

## 2015-06-11 LAB — TSH: TSH: 6.837 u[IU]/mL — ABNORMAL HIGH (ref 0.350–4.500)

## 2015-06-11 LAB — T4, FREE: FREE T4: 1.12 ng/dL (ref 0.61–1.12)

## 2015-06-11 LAB — VITAMIN B12: Vitamin B-12: 462 pg/mL (ref 180–914)

## 2015-06-11 MED ORDER — ENSURE ENLIVE PO LIQD
237.0000 mL | Freq: Two times a day (BID) | ORAL | Status: DC
  Administered 2015-06-11 – 2015-06-12 (×2): 237 mL via ORAL

## 2015-06-11 MED ORDER — HALOPERIDOL LACTATE 5 MG/ML IJ SOLN: 1.0000 mg | Freq: Four times a day (QID) | INTRAMUSCULAR | Status: DC | PRN

## 2015-06-11 MED ORDER — ALPRAZOLAM 1 MG PO TABS
2.0000 mg | ORAL_TABLET | Freq: Every day | ORAL | Status: DC
  Administered 2015-06-11: 2 mg via ORAL
  Filled 2015-06-11: qty 2

## 2015-06-11 MED ORDER — ALPRAZOLAM 1 MG PO TABS
1.0000 mg | ORAL_TABLET | Freq: Every morning | ORAL | Status: DC
  Administered 2015-06-12: 1 mg via ORAL
  Filled 2015-06-11 (×2): qty 1

## 2015-06-11 MED ORDER — CLONIDINE HCL 0.1 MG PO TABS
0.1000 mg | ORAL_TABLET | Freq: Two times a day (BID) | ORAL | Status: DC
  Administered 2015-06-11 – 2015-06-12 (×3): 0.1 mg via ORAL
  Filled 2015-06-11 (×3): qty 1

## 2015-06-11 NOTE — Progress Notes (Signed)
Pt's iv came out, attempted to start new iv 2x with no success.

## 2015-06-11 NOTE — Progress Notes (Signed)
Initial Nutrition Assessment  DOCUMENTATION CODES:   Obesity unspecified  INTERVENTION:  Ensure Enlive BID. Each supplement provides 350 kcals and 20 g of protein.   NUTRITION DIAGNOSIS:   Inadequate oral intake related to lethargy/confusion (UTI) as evidenced by per patient/family report, meal completion < 25%.  GOAL:   Patient will meet greater than or equal to 90% of their needs  MONITOR:   PO intake, Supplement acceptance, Labs, Weight trends, Skin, I & O's  REASON FOR ASSESSMENT:   Low Braden    ASSESSMENT:   Pt with hx of HLD, HTN, known CAD, moderate to advanced dementia, assisted living resident, brought in by POV by his sister in law (HCP) as he was found to be more confused the past 3 days. She said in the past, it means he has a UTI. He denied to me any chest pain, SOB, nausea, or vomiting. Evaluation in the ER included a troponin of 0.06 and 0.08, with EKG showing no ischemia, and he was started on IV heparin, and hospitalist was asked to admit him for elevated troponins. His CT of the head showed no acute process, and his CXR was clear. In speaking with his step daughter and his sister in laws, he is a DNR.   Pt in mits and with sitter at time of visit. Pt very confused and could not communicate. Unable to obtain any diet or weight hx from pt. Sitter reports pt ate poorly (25%) at breakfast today. No PO's recorded in chart. Will order Ensure BID to supplement poor PO's.  NFPE reveals no muscle or fat depletion, no edema, Labs reviewed; BUN 25, creat 1.33,  Ca 8.6, GFR 49, CBGs 120-146.  Meds reviewed.  Diet Order:  Diet Carb Modified Fluid consistency:: Thin; Room service appropriate?: Yes  Skin:  Reviewed, no issues  Last BM:  5/28  Height:   Ht Readings from Last 1 Encounters:  06/10/15 5\' 7"  (1.702 m)    Weight:   Wt Readings from Last 1 Encounters:  06/10/15 210 lb (95.255 kg)    Ideal Body Weight:  67.3 kg  BMI:  Body mass index is  32.88 kg/(m^2).  Estimated Nutritional Needs:   Kcal:  1350-1550  Protein:  65-75   Fluid:  1.5 L  EDUCATION NEEDS:   No education needs identified at this time  Geoffery Lyons, [place] Intern Pager [phone]

## 2015-06-11 NOTE — Clinical Social Work Note (Signed)
Clinical Social Work Assessment  Patient Details  Name: [name] MRN: [id number] Date of Birth: 12-Nov-1933  Date of referral:  06/11/15               Reason for consult:  Discharge Planning                Permission sought to share information with:  Chartered certified accountant granted to share information::  Yes, Verbal Permission Granted  Name::        Agency::  [facility]  Relationship::  facility  Contact Information:     Housing/Transportation Living arrangements for the past 2 months:  [place] of Information:  Facility, Adult Children Patient Interpreter Needed:  None Criminal Activity/Legal Involvement Pertinent to Current Situation/Hospitalization:  No - Comment as needed Significant Relationships:  Adult Children, Other Family Members Lives with:  Facility Resident Do you feel safe going back to the place where you live?  Yes Need for family participation in patient care:  Yes (Comment)  Care giving concerns:  None reported. Pt is long term resident at ALF.    Social Worker assessment / plan:  CSW spoke with pt's daughter/guardian, Engineer, civil (consulting). Pt has been a resident at [facility] for almost a year now. [name] lives in [place] and visits on weekends, but other family is in town and see him often. She indicates pt has done very well at [facility] and requests that he return there at d/c. Per Butch Penny at facility, pt requires assist with bathing and dressing. He ambulates independently. Okay to return and no home health prior to admission. She said that in the middle of the night, pt had increased confusion from baseline and was hallucinating. Pt has dementia and some agitation at baseline.   Employment status:  Retired Nurse, adult PT Recommendations:  Not assessed at this time Information / Referral to community resources:  Other (Comment Required) (return to [facility])  Patient/Family's Response to care:   Pt's daughter requests return to [facility].   Patient/Family's Understanding of and Emotional Response to Diagnosis, Current Treatment, and Prognosis: Pt's family is aware of admission diagnosis and shared that pt's Xanax was changed last week which they feel caused increased confusion.   Emotional Assessment Appearance:  Appears stated age Attitude/Demeanor/Rapport:  Unable to Assess Affect (typically observed):  Unable to Assess Orientation:  Oriented to Self Alcohol / Substance use:  Not Applicable Psych involvement (Current and /or in the community):  No (Comment)  Discharge Needs  Concerns to be addressed:  Discharge Planning Concerns Readmission within the last 30 days:  No Current discharge risk:  Cognitively Impaired Barriers to Discharge:  Continued Medical Work up   Salome Arnt, [place] 06/11/2015, 10:42 AM [phone]

## 2015-06-11 NOTE — Progress Notes (Addendum)
PROGRESS NOTE    [name]  [id number] DOB: 16-Sep-1933 DOA: 06/09/2015 PCP: Rosita Fire, MD    Brief Narrative:  Patient is an 82 year old man with a history of moderate to advanced dementia, CAD, HTN, who presented from [facility] with altered mental status for 3 days as reported by his POA and family, [name]. In the ED, patient was afebrile and borderline hypoxic with an oxygen saturation of 88-94%. CT of his head revealed no acute intracranial findings, but with small chronic lacunar infarct at the left basal ganglia. His chest x-ray revealed hyperinflated lungs suggesting obstructive lung disease, but otherwise negative. His lab data were significant for BUN of 38, creatinine of 1.60, glucose 174, troponin I of 0.06, unremarkable urinalysis, and normal CBC. He was admitted for further evaluation and management.  Of note, the patient's POA, [name], found out that there was a change in some of the patient's medications at the ALF. Apparently, Xanax was discontinued a few days ago in favor of lorazepam. [name] feels that the change could explain his altered behavior. She went on to say that [name] is no longer his PCP, but he now sees [name]. [name] was notified after I had already examined the patient and made orders. He will see the patient on 06/12/15.   Assessment & Plan:   Principal Problem:   Acute encephalopathy Active Problems:   Chronic respiratory failure with hypoxia (HCC)   Elevated troponin I level   Diabetes mellitus with nephropathy (HCC)   Hypothyroidism   Confusion   Senile dementia with delirium   Benzodiazepine withdrawal (HCC)   AKI (acute kidney injury) ([place])   CKD (chronic kidney disease), stage III   1. Acute encephalopathy in the setting of chronic moderate dementia; possibly from benzodiazepine withdrawal (Xanax withdrawal). Evaluation studies revealed no acute infarct on the CT of the head, no evidence of pneumonia, and no  evidence of a urinary tract infection. He was not hypoglycemic on admission. His troponin I was minimally elevated, but doubt ACS. The family reported that there was a recent change in his benzodiazepines from Xanax to Ativan. The family feels that this change may be the cause of his altered mental status. The patient has chronic dementia, but the family says that his current lethargy is not his baseline. -He was restarted on Namenda. -Ativan was discontinued and Xanax was restarted at the dose he had been on previously as reported by his family. -We will order additional studies for evaluation including ammonia, TSH, free T4, and vitamin B12 level. -We'll add when necessary Haldol for agitation. Continue Air cabin crew. -His PCP, [name] was notified and will follow-up with the patient tomorrow.  Mildly elevated troponin I. Patient's EKG reveals stable right bundle branch block, normal sinus rhythm, an old infarct; no acute changes compared to the previous EKG. Elevated troponin I is likely from mild demand ischemia. He is being continued on aspirin. Given his age, DO NOT RESUSCITATE status, and debilitation, he does not appear to be a candidate for further diagnostic and/or therapeutic measures.  Hypoxia, chronic respiratory failure with hypoxia. Patient's oxygen saturations were 88-94% on room air in the ED. His family reports that the he is supposed to be on oxygen chronically, but he does not wear it. -We'll continue nasal cannula oxygen started.  Hypertension. Patient was restarted on clonidine. Losartan was held due to mild acute on chronic renal failure. His blood pressure is stable. -Consider restarting losartan in the  next day or 2 depending on his renal function.  Acute kidney injury superimposed on chronic kidney disease, likely from prerenal azotemia. Per chart review, the patient's creatinine at baseline appears to be 1.35-1.4. It was 1.6 on admission. He was started on gentle IV  fluids. His creatinine has improved to baseline at 1.33.  Diabetes mellitus. Patient is treated chronically with glipizide. It is currently on hold. Sliding scale NovoLog has been started. His CBGs are reasonable.   DVT prophylaxis: subcutaneous heparin  Code Status: DO NOT RESUSCITATE  Family Communication: discussed with [name] who is the POA; also discussed with patient's daughter.  Disposition Plan: anticipate discharge back to ALF when clinically appropriate.    Consultants:   None   Procedures:   None   Antimicrobials:  None    Subjective: Safety sitter is at the bedside. Patient apparently became agitated overnight and was given a next dose of Ativan. His family later came into the room. Questions were answered and concerns addressed.   Objective: Filed Vitals:   06/10/15 1453 06/10/15 2149 06/10/15 2203 06/11/15 0646  BP: 151/97  153/76 153/70  Pulse: 91  88 91  Temp: 97.9 F (36.6 C)  98.1 F (36.7 C) 98.2 F (36.8 C)  TempSrc: Oral  Oral Oral  Resp: 20  20 20   Height:      Weight:      SpO2: 88% 92% 90% 92%   No intake or output data in the 24 hours ending 06/11/15 1435 Filed Weights   06/09/15 2132 06/10/15 0642  Weight: 84.369 kg (186 lb) 95.255 kg (210 lb)    Examination:  General exam: Large frame lethargic 82 year old Caucasian man in no acute distress.   Respiratory system: Clear to auscultation. Respiratory effort normal. Cardiovascular system: S1 & S2 heard, RRR. No JVD, murmurs, rubs, gallops or clicks. No pedal edema. Gastrointestinal system: Abdomen is Mildly obese nondistended, soft and nontender. No organomegaly or masses felt. Normal bowel sounds heard. Central nervous system: he is lethargic, opens his eyes and mumbles, but falls back to sleep. No overt cranial nerve deficits. Exam is limited by the patient's lethargy and chronic confusion from dementia.  Extremities: no pedal edema and no acute hot red joints. Skin: No rashes,  lesions or ulcers Psychiatry: Lethargic and chronically confused.      Data Reviewed: I have personally reviewed following labs and imaging studies  CBC:  Recent Labs Lab 06/09/15 2200 06/11/15 0742  WBC 10.5 10.5  NEUTROABS 6.4  --   HGB 16.1 16.0  HCT 48.6 47.8  MCV 94.4 93.0  PLT 250 99991111   Basic Metabolic Panel:  Recent Labs Lab 06/09/15 2200 06/11/15 0742  NA 138 135  K 4.2 3.9  CL 101 102  CO2 27 25  GLUCOSE 174* 122*  BUN 38* 25*  CREATININE 1.60* 1.33*  CALCIUM 9.4 8.6*   GFR: Estimated Creatinine Clearance: 48.7 mL/min (by C-G formula based on Cr of 1.33). Liver Function Tests:  Recent Labs Lab 06/09/15 2200  AST 21  ALT 18  ALKPHOS 52  BILITOT 0.5  PROT 8.2*  ALBUMIN 4.2   No results for input(s): LIPASE, AMYLASE in the last 168 hours. No results for input(s): AMMONIA in the last 168 hours. Coagulation Profile: No results for input(s): INR, PROTIME in the last 168 hours. Cardiac Enzymes:  Recent Labs Lab 06/09/15 2200 06/10/15 0125 06/10/15 0640 06/10/15 1236 06/10/15 1850  TROPONINI 0.06* 0.08* 0.08* 0.09* 0.11*   BNP (last 3 results)  No results for input(s): PROBNP in the last 8760 hours. HbA1C: No results for input(s): HGBA1C in the last 72 hours. CBG:  Recent Labs Lab 06/10/15 1219 06/10/15 1548 06/10/15 2019 06/11/15 0806 06/11/15 1139  GLUCAP 130* 137* 146* 120* 135*   Lipid Profile: No results for input(s): CHOL, HDL, LDLCALC, TRIG, CHOLHDL, LDLDIRECT in the last 72 hours. Thyroid Function Tests: No results for input(s): TSH, T4TOTAL, FREET4, T3FREE, THYROIDAB in the last 72 hours. Anemia Panel: No results for input(s): VITAMINB12, FOLATE, FERRITIN, TIBC, IRON, RETICCTPCT in the last 72 hours. Sepsis Labs:  Recent Labs Lab 06/09/15 2200  LATICACIDVEN 1.5    Recent Results (from the past 240 hour(s))  MRSA PCR Screening     Status: None   Collection Time: 06/10/15  7:51 AM  Result Value Ref Range Status    MRSA by PCR NEGATIVE NEGATIVE Final    Comment:        The GeneXpert MRSA Assay (FDA approved for NASAL specimens only), is one component of a comprehensive MRSA colonization surveillance program. It is not intended to diagnose MRSA infection nor to guide or monitor treatment for MRSA infections.          Radiology Studies: Dg Chest 2 View  06/09/2015  CLINICAL DATA:  Altered mental status EXAM: CHEST  2 VIEW COMPARISON:  08/09/2014 chest radiograph. FINDINGS: Stable cardiomediastinal silhouette with normal heart size. No pneumothorax. No pleural effusion. Hyperinflated lungs. No pulmonary edema. No acute consolidative airspace disease. IMPRESSION: Hyperinflated lungs, suggesting obstructive lung disease. Otherwise no active disease in the chest. Electronically Signed   By: Ilona Sorrel M.D.   On: 06/09/2015 23:28   Ct Head Wo Contrast  06/09/2015  CLINICAL DATA:  Acute onset of confusion and hallucinations. Altered mental status. Initial encounter. EXAM: CT HEAD WITHOUT CONTRAST TECHNIQUE: Contiguous axial images were obtained from the base of the skull through the vertex without intravenous contrast. COMPARISON:  CT of the head performed 08/02/2014 FINDINGS: There is no evidence of acute infarction, mass lesion, or intra- or extra-axial hemorrhage on CT. Prominence of the ventricles and sulci reflects mild cortical volume loss. Mild cerebellar atrophy is noted. Scattered periventricular and subcortical white matter change likely reflects small vessel ischemic microangiopathy. A small chronic lacunar infarct is noted at the left basal ganglia. The brainstem and fourth ventricle are within normal limits. The cerebral hemispheres demonstrate grossly normal gray-white differentiation. No mass effect or midline shift is seen. There is no evidence of fracture; visualized osseous structures are unremarkable in appearance. The orbits are within normal limits. A small mucus retention cyst or polyp is  noted at the right maxillary sinus. The remaining paranasal sinuses and mastoid air cells are well-aerated. No significant soft tissue abnormalities are seen. IMPRESSION: 1. No acute intracranial pathology seen on CT. 2. Mild cortical volume loss and scattered small vessel ischemic microangiopathy. 3. Small chronic lacunar infarct at the left basal ganglia. 4. Small mucus retention cyst or polyp at the right maxillary sinus. Electronically Signed   By: Garald Balding M.D.   On: 06/09/2015 23:31        Scheduled Meds: . ALPRAZolam  1 mg Oral q morning - 10a  . ALPRAZolam  2 mg Oral QHS  . aspirin EC  81 mg Oral Daily  . cloNIDine  0.1 mg Oral BID  . famotidine  20 mg Oral Daily  . feeding supplement (ENSURE ENLIVE)  237 mL Oral BID BM  . heparin  5,000 Units Subcutaneous Q8H  .  insulin aspart  0-5 Units Subcutaneous QHS  . insulin aspart  0-9 Units Subcutaneous TID WC  . levothyroxine  112 mcg Oral QAC breakfast  . memantine  10 mg Oral BID  . sodium chloride flush  3 mL Intravenous Q12H  . tamsulosin  0.4 mg Oral QHS   Continuous Infusions:       Time spent: 38-40 minutes including discussion with POA, [name].    Rexene Alberts, MD [facility] Pager [phone]   If 7PM-7AM, please contact night-coverage [email or web address] Password [email or web address] 06/11/2015, 2:35 PM

## 2015-06-11 NOTE — Care Management Obs Status (Signed)
[facility] NOTIFICATION   Patient Details  Name: CRAYTON MARTINA MRN: [id number] Date of Birth: 05/14/1933   Medicare Observation Status Notification Given:  Yes    Sherald Barge, RN 06/11/2015, 11:04 AM

## 2015-06-12 LAB — BASIC METABOLIC PANEL
ANION GAP: 13 (ref 5–15)
BUN: 31 mg/dL — AB (ref 6–20)
CHLORIDE: 102 mmol/L (ref 101–111)
CO2: 26 mmol/L (ref 22–32)
Calcium: 8.9 mg/dL (ref 8.9–10.3)
Creatinine, Ser: 1.49 mg/dL — ABNORMAL HIGH (ref 0.61–1.24)
GFR calc Af Amer: 49 mL/min — ABNORMAL LOW (ref >60)
GFR calc non Af Amer: 43 mL/min — ABNORMAL LOW (ref >60)
Glucose, Bld: 99 mg/dL (ref 65–99)
POTASSIUM: 3.9 mmol/L (ref 3.5–5.1)
SODIUM: 141 mmol/L (ref 135–145)

## 2015-06-12 LAB — HEMOGLOBIN A1C
Hgb A1c MFr Bld: 6.9 % — ABNORMAL HIGH (ref 4.8–5.6)
Mean Plasma Glucose: 151 mg/dL

## 2015-06-12 LAB — URINE CULTURE: CULTURE: NO GROWTH

## 2015-06-12 LAB — GLUCOSE, CAPILLARY
GLUCOSE-CAPILLARY: 99 mg/dL (ref 65–99)
GLUCOSE-CAPILLARY: 129 mg/dL — AB (ref 65–99)

## 2015-06-12 NOTE — Care Management Note (Signed)
Case Management Note  Patient Details  Name: [name] MRN: [id number] Date of Birth: [birth year]  Subjective/Objective: Pt is from [facility] and will be discharging back to [facility] today. CSW aware and arranging return back to [facility]. Patient is back to his baseline. No CM needs.                   Action/Plan: Patient discharging today. No CM needs. Will sign off.   Expected Discharge Date:                  Expected Discharge Plan:  Assisted Living / Rest Home  In-House Referral:     Discharge planning Services  CM Consult  Post Acute Care Choice:  NA Choice offered to:  NA  DME Arranged:    DME Agency:     HH Arranged:    [place] Agency:     Status of Service:  Completed, signed off  Medicare Important Message Given:    Date Medicare IM Given:    Medicare IM give by:    Date Additional Medicare IM Given:    Additional Medicare Important Message give by:     If discussed at [place] of Stay Meetings, dates discussed:    Additional Comments:  [name], Johnathan Reading, RN 06/12/2015, 11:09 AM

## 2015-06-12 NOTE — Discharge Summary (Signed)
Physician Discharge Summary  Patient ID: [name] MRN: [id number] DOB/AGE: 1933/05/26 82 y.o. Primary Care Physician:Lavoy Bernards, MD Admit date: 06/09/2015 Discharge date: 06/12/2015    Discharge Diagnose Principal Problem:   Acute encephalopathy Active Problems:   Hypothyroidism   Confusion   Senile dementia with delirium   Benzodiazepine withdrawal (HCC)   AKI (acute kidney injury) ([place])   CKD (chronic kidney disease), stage III   Chronic respiratory failure with hypoxia (HCC)   Elevated troponin I level   Diabetes mellitus with nephropathy ([place])     Medication List    STOP taking these medications        LORazepam 0.5 MG tablet  Commonly known as:  ATIVAN      TAKE these medications        ALPRAZolam 1 MG tablet  Commonly known as:  XANAX  Take one tablet by mouth in the morning and two tablets at bedtime for anxiety/rest     ANTACID CALCIUM 500 MG chewable tablet  Generic drug:  calcium carbonate  Chew 2 tablets by mouth 3 (three) times daily with meals.     aspirin 81 MG EC tablet  Take 81 mg by mouth daily.     cloNIDine 0.1 MG tablet  Commonly known as:  CATAPRES  Take 0.1 mg by mouth 2 (two) times daily.     famotidine 20 MG tablet  Commonly known as:  PEPCID  Take 20 mg by mouth daily.     ferrous sulfate 325 (65 FE) MG tablet  Take 1 tablet (325 mg total) by mouth 2 (two) times daily with a meal.     glipiZIDE 5 MG tablet  Commonly known as:  GLUCOTROL  Take 5 mg by mouth daily.     levothyroxine 112 MCG tablet  Commonly known as:  SYNTHROID, LEVOTHROID  Take 112 mcg by mouth daily before breakfast.     losartan 50 MG tablet  Commonly known as:  COZAAR  Take 50 mg by mouth daily.     Melatonin 3 MG Caps  Take 6 mg by mouth at bedtime.     memantine 10 MG tablet  Commonly known as:  NAMENDA  Take 10 mg by mouth 2 (two) times daily.     metFORMIN 500 MG tablet  Commonly known as:  GLUCOPHAGE  Take 500 mg by mouth daily.     nitroGLYCERIN 0.4 MG SL tablet  Commonly known as:  NITROSTAT  Place 0.4 mg under the tongue every 5 (five) minutes as needed for chest pain.     tamsulosin 0.4 MG Caps capsule  Commonly known as:  FLOMAX  Take 1 capsule (0.4 mg total) by mouth at bedtime.        Discharged Condition: improved    Consults None  Significant Diagnostic Studies: Dg Chest 2 View  06/09/2015  CLINICAL DATA:  Altered mental status EXAM: CHEST  2 VIEW COMPARISON:  08/09/2014 chest radiograph. FINDINGS: Stable cardiomediastinal silhouette with normal heart size. No pneumothorax. No pleural effusion. Hyperinflated lungs. No pulmonary edema. No acute consolidative airspace disease. IMPRESSION: Hyperinflated lungs, suggesting obstructive lung disease. Otherwise no active disease in the chest. Electronically Signed   By: Ilona Sorrel M.D.   On: 06/09/2015 23:28   Ct Head Wo Contrast  06/09/2015  CLINICAL DATA:  Acute onset of confusion and hallucinations. Altered mental status. Initial encounter. EXAM: CT HEAD WITHOUT CONTRAST TECHNIQUE: Contiguous axial images were obtained from the base of the skull through the vertex without intravenous  contrast. COMPARISON:  CT of the head performed 08/02/2014 FINDINGS: There is no evidence of acute infarction, mass lesion, or intra- or extra-axial hemorrhage on CT. Prominence of the ventricles and sulci reflects mild cortical volume loss. Mild cerebellar atrophy is noted. Scattered periventricular and subcortical white matter change likely reflects small vessel ischemic microangiopathy. A small chronic lacunar infarct is noted at the left basal ganglia. The brainstem and fourth ventricle are within normal limits. The cerebral hemispheres demonstrate grossly normal gray-white differentiation. No mass effect or midline shift is seen. There is no evidence of fracture; visualized osseous structures are unremarkable in appearance. The orbits are within normal limits. A small mucus  retention cyst or polyp is noted at the right maxillary sinus. The remaining paranasal sinuses and mastoid air cells are well-aerated. No significant soft tissue abnormalities are seen. IMPRESSION: 1. No acute intracranial pathology seen on CT. 2. Mild cortical volume loss and scattered small vessel ischemic microangiopathy. 3. Small chronic lacunar infarct at the left basal ganglia. 4. Small mucus retention cyst or polyp at the right maxillary sinus. Electronically Signed   By: Garald Balding M.D.   On: 06/09/2015 23:31    Lab Results: Basic Metabolic Panel:  Recent Labs  06/11/15 0742 06/12/15 0702  NA 135 141  K 3.9 3.9  CL 102 102  CO2 25 26  GLUCOSE 122* 99  BUN 25* 31*  CREATININE 1.33* 1.49*  CALCIUM 8.6* 8.9   Liver Function Tests:  Recent Labs  06/09/15 2200  AST 21  ALT 18  ALKPHOS 52  BILITOT 0.5  PROT 8.2*  ALBUMIN 4.2     CBC:  Recent Labs  06/09/15 2200 06/11/15 0742  WBC 10.5 10.5  NEUTROABS 6.4  --   HGB 16.1 16.0  HCT 48.6 47.8  MCV 94.4 93.0  PLT 250 218    Recent Results (from the past 240 hour(s))  Urine culture     Status: None   Collection Time: 06/10/15  4:52 AM  Result Value Ref Range Status   Specimen Description URINE, CATHETERIZED  Final   Special Requests NONE  Final   Culture NO GROWTH Performed at [facility]   Final   Report Status 06/12/2015 FINAL  Final  MRSA PCR Screening     Status: None   Collection Time: 06/10/15  7:51 AM  Result Value Ref Range Status   MRSA by PCR NEGATIVE NEGATIVE Final    Comment:        The GeneXpert MRSA Assay (FDA approved for NASAL specimens only), is one component of a comprehensive MRSA colonization surveillance program. It is not intended to diagnose MRSA infection nor to guide or monitor treatment for MRSA infections.      Hospital Course:  This is an 82 years old male patient from assisted living was admitted due to change in mental status. CT Scan of the head and  blood test and chest x-ray was negative for any acute illness. Patient was was monitored. He Benzodiazepines waas adjusted. Patient is back to his baseline. He was evaluated by physical therapy for ambulation. Patient is stable to be discharged back to assisted living and continue his treatment.  Discharge Exam: Blood pressure 119/60, pulse 90, temperature 97.6 F (36.4 C), temperature source Oral, resp. rate 20, height 5\' 7"  (1.702 m), weight 95.255 kg (210 lb), SpO2 93 %.   Disposition: stable        Follow-up Information    Follow up with [facility], MD In 1  week.   Specialty:  Internal Medicine   Contact information:   [place] [address] [phone]       Signed: Rosita Fire   06/12/2015, 3:49 PM

## 2015-06-12 NOTE — Progress Notes (Signed)
Subjective: Patient was admitted due to change in mental status and confusion. His workup was negative. His benzodiazepine was re-ordered. Patient is more alert and awake but remained confused.  Objective: Vital signs in last 24 hours: Temp:  [97.8 F (36.6 C)-98.3 F (36.8 C)] 98.3 F (36.8 C) (05/30 0717) Pulse Rate:  [63-87] 87 (05/30 0717) Resp:  [16-20] 20 (05/30 0717) BP: (129-143)/(71-91) 143/91 mmHg (05/30 0717) SpO2:  [95 %-96 %] 96 % (05/30 0717) Weight change:  Last BM Date: 06/10/15  Intake/Output from previous day: 05/29 0701 - 05/30 0700 In: 120 [P.O.:120] Out: -   PHYSICAL EXAM General appearance: alert and delirious Resp: clear to auscultation bilaterally Cardio: S1, S2 normal GI: soft, non-tender; bowel sounds normal; no masses,  no organomegaly Extremities: extremities normal, atraumatic, no cyanosis or edema  Lab Results:  Results for orders placed or performed during the hospital encounter of 06/09/15 (from the past 48 hour(s))  Glucose, capillary     Status: Abnormal   Collection Time: 06/10/15 12:19 PM  Result Value Ref Range   Glucose-Capillary 130 (H) 65 - 99 mg/dL  Troponin I     Status: Abnormal   Collection Time: 06/10/15 12:36 PM  Result Value Ref Range   Troponin I 0.09 (H) <0.031 ng/mL    Comment:        PERSISTENTLY INCREASED TROPONIN VALUES IN THE RANGE OF 0.04-0.49 ng/mL CAN BE SEEN IN:       -UNSTABLE ANGINA       -CONGESTIVE HEART FAILURE       -MYOCARDITIS       -CHEST TRAUMA       -ARRYHTHMIAS       -LATE PRESENTING MYOCARDIAL INFARCTION       -COPD   CLINICAL FOLLOW-UP RECOMMENDED.   Glucose, capillary     Status: Abnormal   Collection Time: 06/10/15  3:48 PM  Result Value Ref Range   Glucose-Capillary 137 (H) 65 - 99 mg/dL   Comment 1 Notify RN    Comment 2 Document in Chart   Troponin I     Status: Abnormal   Collection Time: 06/10/15  6:50 PM  Result Value Ref Range   Troponin I 0.11 (H) <0.031 ng/mL    Comment:         PERSISTENTLY INCREASED TROPONIN VALUES IN THE RANGE OF 0.04-0.49 ng/mL CAN BE SEEN IN:       -UNSTABLE ANGINA       -CONGESTIVE HEART FAILURE       -MYOCARDITIS       -CHEST TRAUMA       -ARRYHTHMIAS       -LATE PRESENTING MYOCARDIAL INFARCTION       -COPD   CLINICAL FOLLOW-UP RECOMMENDED.   Glucose, capillary     Status: Abnormal   Collection Time: 06/10/15  8:19 PM  Result Value Ref Range   Glucose-Capillary 146 (H) 65 - 99 mg/dL  Basic metabolic panel     Status: Abnormal   Collection Time: 06/11/15  7:42 AM  Result Value Ref Range   Sodium 135 135 - 145 mmol/L   Potassium 3.9 3.5 - 5.1 mmol/L   Chloride 102 101 - 111 mmol/L   CO2 25 22 - 32 mmol/L   Glucose, Bld 122 (H) 65 - 99 mg/dL   BUN 25 (H) 6 - 20 mg/dL   Creatinine, Ser 1.33 (H) 0.61 - 1.24 mg/dL   Calcium 8.6 (L) 8.9 - 10.3 mg/dL   GFR calc non Af  Amer 49 (L) >60 mL/min   GFR calc Af Amer 57 (L) >60 mL/min    Comment: (NOTE) The eGFR has been calculated using the CKD EPI equation. This calculation has not been validated in all clinical situations. eGFR's persistently <60 mL/min signify possible Chronic Kidney Disease.    Anion gap 8 5 - 15  CBC     Status: None   Collection Time: 06/11/15  7:42 AM  Result Value Ref Range   WBC 10.5 4.0 - 10.5 K/uL   RBC 5.14 4.22 - 5.81 MIL/uL   Hemoglobin 16.0 13.0 - 17.0 g/dL   HCT 47.8 39.0 - 52.0 %   MCV 93.0 78.0 - 100.0 fL   MCH 31.1 26.0 - 34.0 pg   MCHC 33.5 30.0 - 36.0 g/dL   RDW 13.8 11.5 - 15.5 %   Platelets 218 150 - 400 K/uL  Glucose, capillary     Status: Abnormal   Collection Time: 06/11/15  8:06 AM  Result Value Ref Range   Glucose-Capillary 120 (H) 65 - 99 mg/dL   Comment 1 Notify RN   Glucose, capillary     Status: Abnormal   Collection Time: 06/11/15 11:39 AM  Result Value Ref Range   Glucose-Capillary 135 (H) 65 - 99 mg/dL   Comment 1 Notify RN   Hemoglobin A1c     Status: Abnormal   Collection Time: 06/11/15  2:14 PM  Result Value Ref  Range   Hgb A1c MFr Bld 6.9 (H) 4.8 - 5.6 %    Comment: (NOTE)         Pre-diabetes: 5.7 - 6.4         Diabetes: >6.4         Glycemic control for adults with diabetes: <7.0    Mean Plasma Glucose 151 mg/dL    Comment: (NOTE) Performed At: [facility] [place], [place] [id number] Lindon Romp MD OE:[id number]   Vitamin B12     Status: None   Collection Time: 06/11/15  2:14 PM  Result Value Ref Range   Vitamin B-12 462 180 - 914 pg/mL    Comment: (NOTE) This assay is not validated for testing neonatal or myeloproliferative syndrome specimens for Vitamin B12 levels. Performed at [facility]   TSH     Status: Abnormal   Collection Time: 06/11/15  2:14 PM  Result Value Ref Range   TSH 6.837 (H) 0.350 - 4.500 uIU/mL  T4, free     Status: None   Collection Time: 06/11/15  2:14 PM  Result Value Ref Range   Free T4 1.12 0.61 - 1.12 ng/dL    Comment: Performed at [facility]  Ammonia     Status: None   Collection Time: 06/11/15  2:14 PM  Result Value Ref Range   Ammonia 31 9 - 35 umol/L  Glucose, capillary     Status: Abnormal   Collection Time: 06/11/15  4:36 PM  Result Value Ref Range   Glucose-Capillary 126 (H) 65 - 99 mg/dL   Comment 1 Notify RN   Glucose, capillary     Status: Abnormal   Collection Time: 06/11/15  8:56 PM  Result Value Ref Range   Glucose-Capillary 111 (H) 65 - 99 mg/dL   Comment 1 Notify RN   Basic metabolic panel     Status: Abnormal   Collection Time: 06/12/15  7:02 AM  Result Value Ref Range   Sodium 141 135 - 145 mmol/L   Potassium  3.9 3.5 - 5.1 mmol/L   Chloride 102 101 - 111 mmol/L   CO2 26 22 - 32 mmol/L   Glucose, Bld 99 65 - 99 mg/dL   BUN 31 (H) 6 - 20 mg/dL   Creatinine, Ser 1.49 (H) 0.61 - 1.24 mg/dL   Calcium 8.9 8.9 - 10.3 mg/dL   GFR calc non Af Amer 43 (L) >60 mL/min   GFR calc Af Amer 49 (L) >60 mL/min    Comment: (NOTE) The eGFR has been calculated using the CKD EPI equation. This  calculation has not been validated in all clinical situations. eGFR's persistently <60 mL/min signify possible Chronic Kidney Disease.    Anion gap 13 5 - 15  Glucose, capillary     Status: None   Collection Time: 06/12/15  7:43 AM  Result Value Ref Range   Glucose-Capillary 99 65 - 99 mg/dL    ABGS No results for input(s): PHART, PO2ART, TCO2, HCO3 in the last 72 hours.  Invalid input(s): PCO2 CULTURES Recent Results (from the past 240 hour(s))  MRSA PCR Screening     Status: None   Collection Time: 06/10/15  7:51 AM  Result Value Ref Range Status   MRSA by PCR NEGATIVE NEGATIVE Final    Comment:        The GeneXpert MRSA Assay (FDA approved for NASAL specimens only), is one component of a comprehensive MRSA colonization surveillance program. It is not intended to diagnose MRSA infection nor to guide or monitor treatment for MRSA infections.    Studies/Results: No results found.  Medications: I have reviewed the patient's current medications.  Assesment:   Principal Problem:   Acute encephalopathy Active Problems:   Hypothyroidism   Confusion   Senile dementia with delirium   Benzodiazepine withdrawal (HCC)   AKI (acute kidney injury) ([place])   CKD (chronic kidney disease), stage III   Chronic respiratory failure with hypoxia (HCC)   Elevated troponin I level   Diabetes mellitus with nephropathy ([place])    Plan:  Medications reviewed Will do physical therapy for possible ambulation Continue current treatment and supportive care.      Brylea Pita 06/12/2015, 8:15 AM

## 2015-06-12 NOTE — Clinical Social Work Note (Signed)
Pt d/c today and to return to [facility]. CSW spoke with Butch Penny at facility and notified MD of need for d/c summary for return to ALF.   [name], [name]

## 2015-06-12 NOTE — Progress Notes (Signed)
Pt discharged back to [facility] today per Dr. Legrand Rams.  Pt's IV site D/C'd and WDL.  Pt's VSS.  Pt's sister-in-law, [name], at bedside updated regarding plan of care.  Verbalized understanding.  [facility] staff on unit to transport patient back to facility at this time.

## 2015-06-12 NOTE — NC FL2 (Signed)
[place] LEVEL OF CARE SCREENING TOOL     IDENTIFICATION  Patient Name: [name] Birthdate: April 02, 1933 Sex: male Admission Date (Current Location): 06/09/2015  [facility] and [place] Number:  [employer] and Address:  [place] [address] [place]      Provider Number: [id number]  Attending Physician Name and Address:  Rosita Fire, MD  Relative Name and Phone Number:       Current Level of Care: Hospital Recommended Level of Care: [place] Prior Approval Number:    Date Approved/Denied:   PASRR Number:    Discharge Plan: Other (Comment) (ALF)    Current Diagnoses: Patient Active Problem List   Diagnosis Date Noted  . AKI (acute kidney injury) ([place]) 06/11/2015  . CKD (chronic kidney disease), stage III 06/11/2015  . Chronic respiratory failure with hypoxia ([place]) 06/11/2015  . Elevated troponin I level 06/11/2015  . Diabetes mellitus with nephropathy ([place]) 06/11/2015  . Senile dementia with delirium 06/10/2015  . Benzodiazepine withdrawal ([place]) 06/10/2015  . Confusion   . Urinary retention 08/03/2014  . Acute on chronic renal failure ([place]) 08/02/2014  . Hypercalcemia 08/02/2014  . Dehydration 08/02/2014  . UTI (lower urinary tract infection) 08/02/2014  . Subacute delirium 08/02/2014  . COPD (chronic obstructive pulmonary disease) ([place]) 08/02/2014  . Acute respiratory failure with hypoxia ([place]) 08/02/2014  . Elevated AST (SGOT) 08/02/2014  . Acute encephalopathy 06/11/2013  . Hypoxia 02/12/2013  . Chronic kidney disease, stage 3 03/01/2012  . Peripheral vascular disease ([place]) 06/12/2010  . ASCVD (arteriosclerotic cardiovascular disease)   . Cerebrovascular disease   . Diabetes mellitus type 2 with complications ([place])   . Tobacco abuse   . Elevated PSA   . Alcohol abuse, in remission   . Anemia, normocytic normochromic 05/22/2010  . Hypertension 05/13/2010  . HYPERLIPIDEMIA  01/09/2010  . CHRONIC OBSTRUCTIVE PULMONARY DISEASE 11/10/2008  . Hypothyroidism 07/18/2008    Orientation RESPIRATION BLADDER Height & Weight     Self, Place  normal Incontinent Weight: 210 lb (95.255 kg) Height:  5\' 7"  (170.2 cm)  BEHAVIORAL SYMPTOMS/MOOD NEUROLOGICAL BOWEL NUTRITION STATUS  Other (Comment) (n/a)  (n/a) Incontinent Diet (Carb modified)  AMBULATORY STATUS COMMUNICATION OF NEEDS Skin   Limited Assist Verbally Bruising                       Personal Care Assistance Level of Assistance  Bathing, Feeding, Dressing Bathing Assistance: Limited assistance Feeding assistance: Limited assistance Dressing Assistance: Limited assistance     Functional Limitations Info  Sight, Hearing, Speech Sight Info: Adequate Hearing Info: Adequate Speech Info: Adequate    SPECIAL CARE FACTORS FREQUENCY                       Contractures Contractures Info: Not present    Additional Factors Info  Insulin Sliding Scale, Psychotropic Code Status Info: DNR Allergies Info: Prednisone Psychotropic Info: Ativan         Current Medications (06/12/2015):  This is the current hospital active medication list Current Facility-Administered Medications  Medication Dose Route Frequency Provider Last Rate Last Dose  . ALPRAZolam (XANAX) tablet 1 mg  1 mg Oral q morning - 10a Rexene Alberts, MD   1 mg at 06/12/15 1005  . ALPRAZolam Duanne Moron) tablet 2 mg  2 mg Oral QHS Rexene Alberts, MD   2 mg at 06/11/15 2247  . aspirin EC tablet 81 mg  81 mg Oral Daily Orvan Falconer, MD   81 mg at 06/12/15 1006  . cloNIDine (CATAPRES) tablet 0.1 mg  0.1 mg Oral BID Rexene Alberts, MD   0.1 mg at 06/12/15 1006  . famotidine (PEPCID) tablet 20 mg  20 mg Oral Daily Orvan Falconer, MD   20 mg at 06/12/15 1005  . feeding supplement (ENSURE ENLIVE) (ENSURE ENLIVE) liquid 237 mL  237 mL Oral BID BM Jeneen Rinks, RD   237 mL at 06/12/15 1000  . haloperidol lactate (HALDOL) injection 1 mg  1 mg Intravenous Q6H  PRN Rexene Alberts, MD      . heparin injection 5,000 Units  5,000 Units Subcutaneous Q8H Orvan Falconer, MD   5,000 Units at 06/12/15 1505  . insulin aspart (novoLOG) injection 0-5 Units  0-5 Units Subcutaneous QHS Orvan Falconer, MD   0 Units at 06/10/15 2113  . insulin aspart (novoLOG) injection 0-9 Units  0-9 Units Subcutaneous TID WC Orvan Falconer, MD   1 Units at 06/12/15 1210  . levothyroxine (SYNTHROID, LEVOTHROID) tablet 112 mcg  112 mcg Oral QAC breakfast Orvan Falconer, MD   112 mcg at 06/12/15 1005  . memantine (NAMENDA) tablet 10 mg  10 mg Oral BID Orvan Falconer, MD   10 mg at 06/12/15 1006  . sodium chloride flush (NS) 0.9 % injection 3 mL  3 mL Intravenous Q12H Orvan Falconer, MD   3 mL at 06/12/15 1008  . tamsulosin (FLOMAX) capsule 0.4 mg  0.4 mg Oral QHS Orvan Falconer, MD   0.4 mg at 06/11/15 2247     Discharge Medications: Medication List    STOP taking these medications       LORazepam 0.5 MG tablet  Commonly known as: ATIVAN      TAKE these medications       ALPRAZolam 1 MG tablet  Commonly known as: XANAX  Take one tablet by mouth in the morning and two tablets at bedtime for anxiety/rest     ANTACID CALCIUM 500 MG chewable tablet  Generic drug: calcium carbonate  Chew 2 tablets by mouth 3 (three) times daily with meals.     aspirin 81 MG EC tablet  Take 81 mg by mouth daily.     cloNIDine 0.1 MG tablet  Commonly known as: CATAPRES  Take 0.1 mg by mouth 2 (two) times daily.     famotidine 20 MG tablet  Commonly known as: PEPCID  Take 20 mg by mouth daily.     ferrous sulfate 325 (65 FE) MG tablet  Take 1 tablet (325 mg total) by mouth 2 (two) times daily with a meal.     glipiZIDE 5 MG tablet  Commonly known as: GLUCOTROL  Take 5 mg by mouth daily.     levothyroxine 112 MCG tablet  Commonly known as: SYNTHROID, LEVOTHROID  Take 112 mcg by mouth daily before breakfast.     losartan 50 MG tablet  Commonly known as:  COZAAR  Take 50 mg by mouth daily.     Melatonin 3 MG Caps  Take 6 mg by mouth at bedtime.     memantine 10 MG tablet  Commonly known as: NAMENDA  Take 10 mg by mouth 2 (two) times daily.     metFORMIN 500 MG tablet  Commonly known as: GLUCOPHAGE  Take 500 mg by mouth daily.     nitroGLYCERIN 0.4 MG SL tablet  Commonly known as: NITROSTAT  Place 0.4 mg under the tongue every 5 (five) minutes  as needed for chest pain.     tamsulosin 0.4 MG Caps capsule  Commonly known as: FLOMAX  Take 1 capsule (0.4 mg total) by mouth at bedtime.           Relevant Imaging Results:  Relevant Lab Results:   Additional Information Guardians: [name], [place], [place], [place]

## 2015-06-12 NOTE — Evaluation (Signed)
Physical Therapy Evaluation Patient Details Name: [name] MRN: [id number] DOB: [birth year] Today's Date: 06/12/2015   History of Present Illness  82 yo M admitted with increased confusion x 3days.  Head CT (-), troponin elevated to 0.11 on 5/28 likely due to AKI or demand ischemia.  Dx: Acute encephalopathy in the setting of chronic moderate dementia; possibly from benzodiazepine withdrawal (Xanax withdrawal).  PMH: HTN, CAD, moderate-advanced dementia, lives at ALF, COPD, ASCVD, cerebrovascular disease, (+) tobacco use, elevated PSA, ETOH abuse - in remission, MI, urinary retention, ulnar nerve repair, cardiac stent.   Clinical Impression  Pt received in bed, and was agreeable to PT evaluation.  Pt lives at an ALF, and normally does not use any DME for ambulation, and he states he is independent with dressing, but they assist him with bathing.  Today, pt ambulate 271ft with Min guard/supervision with no overt LOB or gait deviations.  Pt does have a history of dementia, but demonstrated good safety awareness today.  Pt is recommended to return to his ALF, no further follow up PT needed at this time as pt seems to be at his baseline level of function.     Follow Up Recommendations      Equipment Recommendations  None recommended by PT    Recommendations for Other Services       Precautions / Restrictions Restrictions Weight Bearing Restrictions: No      Mobility  Bed Mobility Overal bed mobility: Modified Independent                Transfers Overall transfer level: Needs assistance Equipment used: None Transfers: Sit to/from Stand Sit to Stand: Min guard;Supervision            Ambulation/Gait Ambulation/Gait assistance: Min guard;Supervision Ambulation Distance (Feet): 250 Feet Assistive device: None Gait Pattern/deviations: Step-through pattern     General Gait Details: shortened strides bilaterally, with decreased cadence.   Stairs             Wheelchair Mobility    Modified Rankin (Stroke Patients Only)       Balance Overall balance assessment: Modified Independent                                           Pertinent Vitals/Pain Pain Assessment: No/denies pain    Home Living   Living Arrangements: Other (Comment) (ALF) Available Help at Discharge: Available 24 hours/day Type of Home: Assisted living       Home Layout: One level Home Equipment: Clinical cytogeneticist - 2 wheels;Cane - single point      Prior Function Level of Independence: Needs assistance   Gait / Transfers Assistance Needed: Pt does not use any DME for ambulation  ADL's / Homemaking Assistance Needed: Pt states that he is able to dress himself, bur receives assistance for bathing, however states that he could do it himself if he needed.          Hand Dominance        Extremity/Trunk Assessment   Upper Extremity Assessment: Overall WFL for tasks assessed           Lower Extremity Assessment: Overall WFL for tasks assessed         Communication      Cognition Arousal/Alertness: Awake/alert Behavior During Therapy: WFL for tasks assessed/performed Overall Cognitive Status: History of cognitive impairments - at baseline  Memory: Decreased short-term memory              General Comments      Exercises        Assessment/Plan    PT Assessment Patent does not need any further PT services  PT Diagnosis Generalized weakness   PT Problem List    PT Treatment Interventions     PT Goals (Current goals can be found in the Care Plan section) Acute Rehab PT Goals Patient Stated Goal: Pt wants to go back to his ALF PT Goal Formulation: With patient Time For Goal Achievement: 06/19/15 Potential to Achieve Goals: Good    Frequency     Barriers to discharge        Co-evaluation               End of Session Equipment Utilized During Treatment: Gait belt Activity Tolerance: Patient  tolerated treatment well Patient left: in chair;with call bell/phone within reach (Looked for ~10 min for a chair alarm box, unable to locate.  RN notified. ) Nurse Communication: Mobility status    Functional Assessment Tool Used: [employer]	[place] AM-PAC "6-clicks"  Functional Limitation: Mobility: Walking and moving around Mobility: Walking and Moving Around Current Status [phone]): At least 1 percent but less than 20 percent impaired, limited or restricted Mobility: Walking and Moving Around Goal Status [phone]): At least 1 percent but less than 20 percent impaired, limited or restricted Mobility: Walking and Moving Around Discharge Status [phone]): At least 1 percent but less than 20 percent impaired, limited or restricted    Time: 1027-1101 PT Time Calculation (min) (ACUTE ONLY): 34 min   Charges:   PT Evaluation $PT Eval Low Complexity: 1 Procedure PT Treatments $Gait Training: 8-22 mins   PT G Codes:   PT G-Codes **NOT FOR INPATIENT CLASS** Functional Assessment Tool Used: [employer] "6-clicks"  Functional Limitation: Mobility: Walking and moving around Mobility: Walking and Moving Around Current Status [phone]): At least 1 percent but less than 20 percent impaired, limited or restricted Mobility: Walking and Moving Around Goal Status [phone]): At least 1 percent but less than 20 percent impaired, limited or restricted Mobility: Walking and Moving Around Discharge Status [phone]): At least 1 percent but less than 20 percent impaired, limited or restricted    Tacy Learn, PT, DPT X: [id number]   06/12/2015, 12:53 PM

## 2015-06-12 NOTE — Clinical Social Work Note (Addendum)
Pt d/c today back to [facility]. Facility aware and agreeable. CSW notified [name] by [employer] and spoke with [name] who is aware. Facility to provide transport.  [name], [place]

## 2016-04-19 ENCOUNTER — Encounter (HOSPITAL_COMMUNITY): Payer: Self-pay | Admitting: Emergency Medicine

## 2016-04-19 ENCOUNTER — Emergency Department (HOSPITAL_COMMUNITY): Payer: Medicare Other

## 2016-04-19 ENCOUNTER — Emergency Department (HOSPITAL_COMMUNITY)
Admission: EM | Admit: 2016-04-19 | Discharge: 2016-04-19 | Disposition: A | Discharge status: Home / Self Care | Payer: Medicare Other | Attending: Emergency Medicine | Admitting: Emergency Medicine

## 2016-04-19 DIAGNOSIS — J449 Chronic obstructive pulmonary disease, unspecified: Secondary | ICD-10-CM | POA: Diagnosis not present

## 2016-04-19 DIAGNOSIS — E039 Hypothyroidism, unspecified: Secondary | ICD-10-CM | POA: Insufficient documentation

## 2016-04-19 DIAGNOSIS — E11649 Type 2 diabetes mellitus with hypoglycemia without coma: Secondary | ICD-10-CM | POA: Insufficient documentation

## 2016-04-19 DIAGNOSIS — Z7982 Long term (current) use of aspirin: Secondary | ICD-10-CM | POA: Insufficient documentation

## 2016-04-19 DIAGNOSIS — N183 Chronic kidney disease, stage 3 (moderate): Secondary | ICD-10-CM | POA: Insufficient documentation

## 2016-04-19 DIAGNOSIS — R05 Cough: Secondary | ICD-10-CM | POA: Diagnosis present

## 2016-04-19 DIAGNOSIS — I129 Hypertensive chronic kidney disease with stage 1 through stage 4 chronic kidney disease, or unspecified chronic kidney disease: Secondary | ICD-10-CM | POA: Diagnosis not present

## 2016-04-19 DIAGNOSIS — E1122 Type 2 diabetes mellitus with diabetic chronic kidney disease: Secondary | ICD-10-CM | POA: Insufficient documentation

## 2016-04-19 DIAGNOSIS — Z7984 Long term (current) use of oral hypoglycemic drugs: Secondary | ICD-10-CM | POA: Diagnosis not present

## 2016-04-19 DIAGNOSIS — E162 Hypoglycemia, unspecified: Secondary | ICD-10-CM

## 2016-04-19 LAB — CBC
HEMATOCRIT: 39.6 % (ref 39.0–52.0)
HEMOGLOBIN: 13.3 g/dL (ref 13.0–17.0)
MCH: 31.5 pg (ref 26.0–34.0)
MCHC: 33.6 g/dL (ref 30.0–36.0)
MCV: 93.8 fL (ref 78.0–100.0)
Platelets: 196 10*3/uL (ref 150–400)
RBC: 4.22 MIL/uL (ref 4.22–5.81)
RDW: 14.1 % (ref 11.5–15.5)
WBC: 10.7 10*3/uL — AB (ref 4.0–10.5)

## 2016-04-19 LAB — COMPREHENSIVE METABOLIC PANEL
ALBUMIN: 3.6 g/dL (ref 3.5–5.0)
ALT: 13 U/L — ABNORMAL LOW (ref 17–63)
AST: 26 U/L (ref 15–41)
Alkaline Phosphatase: 42 U/L (ref 38–126)
Anion gap: 8 (ref 5–15)
BILIRUBIN TOTAL: 0.6 mg/dL (ref 0.3–1.2)
BUN: 31 mg/dL — AB (ref 6–20)
CO2: 30 mmol/L (ref 22–32)
Calcium: 8.9 mg/dL (ref 8.9–10.3)
Chloride: 97 mmol/L — ABNORMAL LOW (ref 101–111)
Creatinine, Ser: 1.75 mg/dL — ABNORMAL HIGH (ref 0.61–1.24)
GFR calc Af Amer: 40 mL/min — ABNORMAL LOW (ref >60)
GFR calc non Af Amer: 35 mL/min — ABNORMAL LOW (ref >60)
GLUCOSE: 42 mg/dL — AB (ref 65–99)
POTASSIUM: 4.1 mmol/L (ref 3.5–5.1)
SODIUM: 135 mmol/L (ref 135–145)
TOTAL PROTEIN: 7 g/dL (ref 6.5–8.1)

## 2016-04-19 LAB — CBG MONITORING, ED
GLUCOSE-CAPILLARY: 120 mg/dL — AB (ref 65–99)
Glucose-Capillary: 49 mg/dL — ABNORMAL LOW (ref 65–99)
Glucose-Capillary: 42 mg/dL — CL (ref 65–99)

## 2016-04-19 MED ORDER — ALBUTEROL SULFATE HFA 108 (90 BASE) MCG/ACT IN AERS: 1.0000 | INHALATION_SPRAY | Freq: Four times a day (QID) | RESPIRATORY_TRACT | 0 refills | Status: AC | PRN

## 2016-04-19 MED ORDER — DEXTROSE 50 % IV SOLN
INTRAVENOUS | Status: AC
  Filled 2016-04-19: qty 50

## 2016-04-19 MED ORDER — DEXTROSE 50 % IV SOLN
1.0000 | Freq: Once | INTRAVENOUS | Status: AC
  Administered 2016-04-19: 50 mL via INTRAVENOUS

## 2016-04-19 NOTE — ED Provider Notes (Signed)
[place] DEPT Provider Note   CSN: [id number] Arrival date & time: 04/19/16  1214   By signing my name below, I, Eunice Blase, attest that this documentation has been prepared under the direction and in the presence of Nat Christen, MD. Electronically signed, Eunice Blase, ED Scribe. 04/19/16. 1:17 PM.  History   Chief Complaint Chief Complaint  Patient presents with  . Cough   LEVEL 5 CAVEAT: HPI and ROS limited due to mild dementia  The history is provided by the patient and medical records. The history is limited by the condition of the patient. No language interpreter was used.    HPI Comments: [name] is a 83 y.o. male with multiple health disorders who was sent to the Emergency Department for evaluation of mild cough from [facility] assisted living facility. He notes congestion, seasonal allergies and "eye watering". Pt allegedly eating and walking around normally. He denies SOB, chest pain, abdominal pain and leg swelling.  Past Medical History:  Diagnosis Date  . Alcohol abuse, in remission   . Anemia   . ASCVD (arteriosclerotic cardiovascular disease)     Inferior myocardial infarction in 08/1998 requiring PCI of the RCA; moderate residual disease in the left anterior descending and first diagonal; ejection fraction of 45%  . Cerebrovascular disease     Right carotid bruit; plaque without stenosis in 2003 and 2005  . COPD (chronic obstructive pulmonary disease) ([place])   . Diabetes mellitus    A1c-7.7 in 2004 with diet-controlled; 6.3 and 11/08 on oral medication  . Elevated PSA    prior negative prostate biopsy  . Hyperlipidemia   . Hypertension   . Hypothyroid   . MI, old   . Tobacco abuse    Consumption tapered to 2 packs per week  . Urinary retention    07/2014    Patient Active Problem List   Diagnosis Date Noted  . AKI (acute kidney injury) ([name]) 06/11/2015  . CKD (chronic kidney disease), stage III 06/11/2015  . Chronic respiratory failure  with hypoxia ([place]) 06/11/2015  . Elevated troponin I level 06/11/2015  . Diabetes mellitus with nephropathy ([place]) 06/11/2015  . Senile dementia with delirium 06/10/2015  . Benzodiazepine withdrawal ([place]) 06/10/2015  . Confusion   . Urinary retention 08/03/2014  . Acute on chronic renal failure ([place]) 08/02/2014  . Hypercalcemia 08/02/2014  . Dehydration 08/02/2014  . UTI (lower urinary tract infection) 08/02/2014  . Subacute delirium 08/02/2014  . COPD (chronic obstructive pulmonary disease) ([place]) 08/02/2014  . Acute respiratory failure with hypoxia ([place]) 08/02/2014  . Elevated AST (SGOT) 08/02/2014  . Acute encephalopathy 06/11/2013  . Hypoxia 02/12/2013  . Chronic kidney disease, stage 3 03/01/2012  . Peripheral vascular disease ([place]) 06/12/2010  . ASCVD (arteriosclerotic cardiovascular disease)   . Cerebrovascular disease   . Diabetes mellitus type 2 with complications ([place])   . Tobacco abuse   . Elevated PSA   . Alcohol abuse, in remission   . Anemia, normocytic normochromic 05/22/2010  . Hypertension 05/13/2010  . HYPERLIPIDEMIA 01/09/2010  . CHRONIC OBSTRUCTIVE PULMONARY DISEASE 11/10/2008  . Hypothyroidism 07/18/2008    Past Surgical History:  Procedure Laterality Date  . COLONOSCOPY  06/2010   Dr. Laural Golden  . DENTAL SURGERY     Right jaw; continued chronic pain  . NASAL SINUS SURGERY    . stents     cardiac stent  . ULNAR NERVE REPAIR     right arm       Home Medications  Prior to Admission medications   Medication Sig Start Date End Date Taking? Authorizing Provider  ALPRAZolam Duanne Moron) 1 MG tablet Take one tablet by mouth in the morning and two tablets at bedtime for anxiety/rest Patient taking differently: Take 1-2 mg by mouth 2 (two) times daily. Take one tablet by mouth in the morning and two tablets at bedtime for anxiety/rest 08/07/14  Yes Tiffany L Reed, DO  aspirin 81 MG EC tablet Take 81 mg by mouth daily.     Yes Historical Provider, MD  calcium  carbonate (ANTACID CALCIUM) 500 MG chewable tablet Chew 2 tablets by mouth 3 (three) times daily with meals.   Yes Historical Provider, MD  cloNIDine (CATAPRES) 0.1 MG tablet Take 0.1 mg by mouth 2 (two) times daily.  09/22/11  Yes Historical Provider, MD  dextromethorphan-guaiFENesin (TUSSIN DM) 10-100 MG/5ML liquid Take 10 mLs by mouth every 4 (four) hours as needed for cough.   Yes Historical Provider, MD  famotidine (PEPCID) 20 MG tablet Take 20 mg by mouth daily.   Yes Historical Provider, MD  ferrous sulfate 325 (65 FE) MG tablet Take 1 tablet (325 mg total) by mouth 2 (two) times daily with a meal. 08/04/14  Yes Lezlie Octave Black, NP  glipiZIDE (GLUCOTROL) 5 MG tablet Take 5 mg by mouth daily.    Yes Historical Provider, MD  levothyroxine (SYNTHROID, LEVOTHROID) 112 MCG tablet Take 112 mcg by mouth daily before breakfast.   Yes Historical Provider, MD  losartan (COZAAR) 50 MG tablet Take 50 mg by mouth daily.  05/09/14  Yes Historical Provider, MD  Melatonin 3 MG CAPS Take 6 mg by mouth at bedtime.   Yes Historical Provider, MD  memantine (NAMENDA) 10 MG tablet Take 10 mg by mouth 2 (two) times daily. 07/21/14  Yes Historical Provider, MD  metFORMIN (GLUCOPHAGE) 500 MG tablet Take 500 mg by mouth daily.   Yes Historical Provider, MD  nitroGLYCERIN (NITROSTAT) 0.4 MG SL tablet Place 0.4 mg under the tongue every 5 (five) minutes as needed for chest pain.    Yes Historical Provider, MD  senna (SENOKOT) 8.6 MG TABS tablet Take 1 tablet by mouth daily as needed for mild constipation.   Yes Historical Provider, MD  tamsulosin (FLOMAX) 0.4 MG CAPS capsule Take 1 capsule (0.4 mg total) by mouth at bedtime. 08/04/14  Yes Radene Gunning, NP  albuterol (PROVENTIL HFA;VENTOLIN HFA) 108 (90 Base) MCG/ACT inhaler Inhale 1-2 puffs into the lungs every 6 (six) hours as needed for wheezing or shortness of breath. 04/19/16   Nat Christen, MD    Family History Family History  Problem Relation Age of Onset  . Hypertension  Mother   . Heart disease Father   . Hyperlipidemia Father   . Hypertension Father   . Diabetes Father     Social History Social History  Substance Use Topics  . Smoking status: Former Smoker    Packs/day: 1.00    Years: 60.00    Types: Cigarettes    Quit date: 04/21/2011  . Smokeless tobacco: Never Used  . Alcohol use No     Comment: Former Abuse     Allergies   Prednisone   Review of Systems Review of Systems  Unable to perform ROS: Dementia     Physical Exam Updated Vital Signs BP (!) 128/49 (BP Location: Left Arm)   Pulse 75   Temp 98.1 F (36.7 C) (Oral)   Resp 18   Ht 6' (1.829 m)   Wt 210  lb (95.3 kg)   SpO2 92%   BMI 28.48 kg/m   Physical Exam  Constitutional: He is oriented to person, place, and time. He appears well-developed and well-nourished.  NAD, no respiratory distress  HENT:  Head: Normocephalic and atraumatic.  Eyes: Conjunctivae are normal.  Neck: Neck supple.  Cardiovascular: Normal rate and regular rhythm.   Pulmonary/Chest: Effort normal and breath sounds normal.  Abdominal: Soft. Bowel sounds are normal.  Musculoskeletal: Normal range of motion.  Neurological: He is alert and oriented to person, place, and time.  Skin: Skin is warm and dry.  Psychiatric: He has a normal mood and affect. His behavior is normal.  Mild dementia  Nursing note and vitals reviewed.    ED Treatments / Results  DIAGNOSTIC STUDIES: Oxygen Saturation is 92% on RA, low by my interpretation.    COORDINATION OF CARE: 1:12 PM Will order imaging.  Labs (all labs ordered are listed, but only abnormal results are displayed) Labs Reviewed  CBC - Abnormal; Notable for the following:       Result Value   WBC 10.7 (*)    All other components within normal limits  COMPREHENSIVE METABOLIC PANEL - Abnormal; Notable for the following:    Chloride 97 (*)    Glucose, Bld 42 (*)    BUN 31 (*)    Creatinine, Ser 1.75 (*)    ALT 13 (*)    GFR calc non Af Amer 35  (*)    GFR calc Af Amer 40 (*)    All other components within normal limits  CBG MONITORING, ED - Abnormal; Notable for the following:    Glucose-Capillary 49 (*)    All other components within normal limits  CBG MONITORING, ED - Abnormal; Notable for the following:    Glucose-Capillary 42 (*)    All other components within normal limits  CBG MONITORING, ED - Abnormal; Notable for the following:    Glucose-Capillary 120 (*)    All other components within normal limits    EKG  EKG Interpretation None       Radiology Dg Chest 2 View  Result Date: 04/19/2016 CLINICAL DATA:  Cough and congestion. History of COPD, diabetes, hypertension. Ex-smoker. EXAM: CHEST  2 VIEW COMPARISON:  Chest x-ray dated 06/09/2015. FINDINGS: Heart size and mediastinal contours are stable. Atherosclerotic changes noted at the aortic arch. Lungs at least mildly hyperexpanded suggesting COPD. Coarse interstitial lung markings suggest associated chronic interstitial lung disease. No evidence of acute pulmonary abnormality. No pleural effusion or pneumothorax seen. No acute or suspicious osseous finding. IMPRESSION: 1. No active cardiopulmonary disease. No evidence of pneumonia or pulmonary edema. 2. Probable COPD and chronic interstitial lung disease. Electronically Signed   By: Franki Cabot M.D.   On: 04/19/2016 13:20    Procedures Procedures (including critical care time)  Medications Ordered in ED Medications  dextrose 50 % solution 50 mL (50 mLs Intravenous Given 04/19/16 1459)     Initial Impression / Assessment and Plan / ED Course  I have reviewed the triage vital signs and the nursing notes.  Pertinent labs & imaging results that were available during my care of the patient were reviewed by me and considered in my medical decision making (see chart for details).     Patient's glucose dropped in the ED. We replaced it orally and with intravenous glucose. Chest x-ray shows no pneumonia. I recommended  that he hold his diabetes medication this evening. Patient was stable at discharge.  Final Clinical  Impressions(s) / ED Diagnoses   Final diagnoses:  Hypoglycemia    New Prescriptions Discharge Medication List as of 04/19/2016  4:51 PM    START taking these medications   Details  albuterol (PROVENTIL HFA;VENTOLIN HFA) 108 (90 Base) MCG/ACT inhaler Inhale 1-2 puffs into the lungs every 6 (six) hours as needed for wheezing or shortness of breath., Starting Sat 04/19/2016, Print      I personally performed the services described in this documentation, which was scribed in my presence. The recorded information has been reviewed and is accurate.     Nat Christen, MD 04/19/16 2136

## 2016-04-19 NOTE — ED Notes (Signed)
Report to [facility] staff- awaiting EMS for transport as [facility] at [facility] [address] per caregiver

## 2016-04-19 NOTE — ED Notes (Signed)
Patient transported to X-ray 

## 2016-04-19 NOTE — ED Notes (Signed)
O2sat noted at 79-82- fingers cold, pt speaking and does not appear dyspnic- O2 via [place] at 1, 2, 3L [place] with gradual rise to 89-91 Dr Lacinda Axon informed Pt insist that he will not stay in the hospital

## 2016-04-19 NOTE — ED Notes (Signed)
Dr Lacinda Axon in for recheck

## 2016-04-19 NOTE — ED Notes (Signed)
Report to EMS- Report previously to [place] transport to [facility]

## 2016-04-19 NOTE — Discharge Instructions (Signed)
[name]'s glucose has drooped here in the ED.  Check it carefully.  Recommend holding his diabetes medicine today.Prescription for inhaler.  Can use supplemental oxygen if needed

## 2016-04-19 NOTE — ED Notes (Signed)
Continues to await EMS

## 2016-04-19 NOTE — ED Notes (Signed)
Critical glucose of 42.  Reported to Dr Lacinda Axon.  VO to feed pt.  Pt given container of strawberry icecream, 2 bites of beans ,2 bites of fried apples from lunch tray and 1/2 can plain coca cola.  Reported to pt's primary nurse to recheck glucose.

## 2016-04-19 NOTE — ED Triage Notes (Signed)
Pt sent from [facility] for evaluation of congestion and cough.

## 2016-06-24 ENCOUNTER — Emergency Department (HOSPITAL_COMMUNITY): Payer: Medicare Other

## 2016-06-24 ENCOUNTER — Emergency Department (HOSPITAL_COMMUNITY)
Admission: EM | Admit: 2016-06-24 | Discharge: 2016-06-24 | Disposition: A | Discharge status: Home / Self Care | Payer: Medicare Other | Attending: Emergency Medicine | Admitting: Emergency Medicine

## 2016-06-24 ENCOUNTER — Encounter (HOSPITAL_COMMUNITY): Payer: Self-pay | Admitting: Emergency Medicine

## 2016-06-24 DIAGNOSIS — N183 Chronic kidney disease, stage 3 (moderate): Secondary | ICD-10-CM | POA: Insufficient documentation

## 2016-06-24 DIAGNOSIS — E1122 Type 2 diabetes mellitus with diabetic chronic kidney disease: Secondary | ICD-10-CM | POA: Diagnosis not present

## 2016-06-24 DIAGNOSIS — E039 Hypothyroidism, unspecified: Secondary | ICD-10-CM | POA: Diagnosis not present

## 2016-06-24 DIAGNOSIS — Z87891 Personal history of nicotine dependence: Secondary | ICD-10-CM | POA: Diagnosis not present

## 2016-06-24 DIAGNOSIS — J449 Chronic obstructive pulmonary disease, unspecified: Secondary | ICD-10-CM | POA: Insufficient documentation

## 2016-06-24 DIAGNOSIS — R3989 Other symptoms and signs involving the genitourinary system: Secondary | ICD-10-CM | POA: Diagnosis present

## 2016-06-24 DIAGNOSIS — K219 Gastro-esophageal reflux disease without esophagitis: Secondary | ICD-10-CM | POA: Insufficient documentation

## 2016-06-24 DIAGNOSIS — I1310 Hypertensive heart and chronic kidney disease without heart failure, with stage 1 through stage 4 chronic kidney disease, or unspecified chronic kidney disease: Secondary | ICD-10-CM | POA: Insufficient documentation

## 2016-06-24 LAB — CBC WITH DIFFERENTIAL/PLATELET
Basophils Absolute: 0.1 10*3/uL (ref 0.0–0.1)
Basophils Relative: 1 %
Eosinophils Absolute: 0.5 10*3/uL (ref 0.0–0.7)
Eosinophils Relative: 6 %
HCT: 44.9 % (ref 39.0–52.0)
Hemoglobin: 14.7 g/dL (ref 13.0–17.0)
Lymphocytes Relative: 27 %
Lymphs Abs: 2.5 10*3/uL (ref 0.7–4.0)
MCH: 31.3 pg (ref 26.0–34.0)
MCHC: 32.7 g/dL (ref 30.0–36.0)
MCV: 95.5 fL (ref 78.0–100.0)
Monocytes Absolute: 0.8 10*3/uL (ref 0.1–1.0)
Monocytes Relative: 9 %
Neutro Abs: 5.3 10*3/uL (ref 1.7–7.7)
Neutrophils Relative %: 57 %
Platelets: 226 10*3/uL (ref 150–400)
RBC: 4.7 MIL/uL (ref 4.22–5.81)
RDW: 13.8 % (ref 11.5–15.5)
WBC: 9.2 10*3/uL (ref 4.0–10.5)

## 2016-06-24 LAB — BASIC METABOLIC PANEL
Anion gap: 8 (ref 5–15)
BUN: 26 mg/dL — ABNORMAL HIGH (ref 6–20)
CO2: 29 mmol/L (ref 22–32)
Calcium: 9.2 mg/dL (ref 8.9–10.3)
Chloride: 100 mmol/L — ABNORMAL LOW (ref 101–111)
Creatinine, Ser: 1.36 mg/dL — ABNORMAL HIGH (ref 0.61–1.24)
GFR calc Af Amer: 55 mL/min — ABNORMAL LOW (ref >60)
GFR calc non Af Amer: 47 mL/min — ABNORMAL LOW (ref >60)
Glucose, Bld: 73 mg/dL (ref 65–99)
Potassium: 4.3 mmol/L (ref 3.5–5.1)
Sodium: 137 mmol/L (ref 135–145)

## 2016-06-24 LAB — URINALYSIS, ROUTINE W REFLEX MICROSCOPIC
Bilirubin Urine: NEGATIVE
Glucose, UA: NEGATIVE mg/dL
Hgb urine dipstick: NEGATIVE
Ketones, ur: NEGATIVE mg/dL
Leukocytes, UA: NEGATIVE
Nitrite: NEGATIVE
Protein, ur: NEGATIVE mg/dL
Specific Gravity, Urine: 1.012 (ref 1.005–1.030)
pH: 6 (ref 5.0–8.0)

## 2016-06-24 NOTE — ED Notes (Signed)
Pt denies patient, pt sat is 88% on room air, placed on 2L now at 92%, pt denies being SOB

## 2016-06-24 NOTE — ED Notes (Signed)
Ambulated pt in hallway on room air, stared at 98% after walk pt was 94%, denies any shortness of breath.

## 2016-06-24 NOTE — ED Notes (Signed)
Bladder scan 335 ML, in and out cath with 310 out,  Bladder scan 14 ml post cath. Pt tolerated well

## 2016-06-24 NOTE — ED Notes (Signed)
Pt dressed and ready for transport, Patient given discharge instruction, IV removed, band aid applied. Patient in wheel chair waiting on transport.

## 2016-06-24 NOTE — ED Notes (Signed)
Report given to [name] , pt ready for discharge, they are aware he needs transport

## 2016-06-24 NOTE — Discharge Instructions (Signed)
I think the symptoms your are experiencing are from reflux. Try to sleep with your head inclined. Try not to strain with urination or bowel movements the best you can. Make sure you take your prilosec and/or pepcid regularly. Use the incentive spirometer 3-4 times per day.

## 2016-06-24 NOTE — ED Provider Notes (Signed)
[facility] DEPT Provider Note   CSN: [id number] Arrival date & time: 06/24/16  1108     History   Chief Complaint Chief Complaint  Patient presents with  . Recurrent UTI    HPI [name] is a 83 y.o. male.  HPI   83yM complaining that every time he urinates he salivates? I'm having a hard time making sense of his symptoms. I'm not sure if it's my interpretation or perhaps some confusion on his part? He is pretty consistent with his description though.  He says that when he urinates he also feels like "it's coming back up (into his mouth)." He denies that he is straining or having difficulty going. He states that it's to the point that he will place tissue paper into his mouth when he urinates. He says he also notices it when he is trying to sleep. He denies CP, throat burning/irritation, sour taste, etc. He denies abdominal pain or dysuria. He notices improvement when he takes Tums. He denies dysphagia. He says he has had to have a urinary catheter previously because of urinary retention.   Noted to be hypoxic on room air. He denies feeling SOB. He says he hasn't been coughing. No fever or chills. He does not feel nauseated.   Past Medical History:  Diagnosis Date  . Alcohol abuse, in remission   . Anemia   . ASCVD (arteriosclerotic cardiovascular disease)     Inferior myocardial infarction in 08/1998 requiring PCI of the RCA; moderate residual disease in the left anterior descending and first diagonal; ejection fraction of 45%  . Cerebrovascular disease     Right carotid bruit; plaque without stenosis in 2003 and 2005  . COPD (chronic obstructive pulmonary disease) ([place])   . Diabetes mellitus    A1c-7.7 in 2004 with diet-controlled; 6.3 and 11/08 on oral medication  . Elevated PSA    prior negative prostate biopsy  . Hyperlipidemia   . Hypertension   . Hypothyroid   . MI, old   . Tobacco abuse    Consumption tapered to 2 packs per week  . Urinary retention    07/2014    Patient Active Problem List   Diagnosis Date Noted  . AKI (acute kidney injury) ([place]) 06/11/2015  . CKD (chronic kidney disease), stage III 06/11/2015  . Chronic respiratory failure with hypoxia ([place]) 06/11/2015  . Elevated troponin I level 06/11/2015  . Diabetes mellitus with nephropathy ([place]) 06/11/2015  . Senile dementia with delirium 06/10/2015  . Benzodiazepine withdrawal ([place]) 06/10/2015  . Confusion   . Urinary retention 08/03/2014  . Acute on chronic renal failure ([place]) 08/02/2014  . Hypercalcemia 08/02/2014  . Dehydration 08/02/2014  . UTI (lower urinary tract infection) 08/02/2014  . Subacute delirium 08/02/2014  . COPD (chronic obstructive pulmonary disease) ([place]) 08/02/2014  . Acute respiratory failure with hypoxia ([employer]) 08/02/2014  . Elevated AST (SGOT) 08/02/2014  . Acute encephalopathy 06/11/2013  . Hypoxia 02/12/2013  . Chronic kidney disease, stage 3 03/01/2012  . Peripheral vascular disease ([place]) 06/12/2010  . ASCVD (arteriosclerotic cardiovascular disease)   . Cerebrovascular disease   . Diabetes mellitus type 2 with complications ([place])   . Tobacco abuse   . Elevated PSA   . Alcohol abuse, in remission   . Anemia, normocytic normochromic 05/22/2010  . Hypertension 05/13/2010  . HYPERLIPIDEMIA 01/09/2010  . CHRONIC OBSTRUCTIVE PULMONARY DISEASE 11/10/2008  . Hypothyroidism 07/18/2008    Past Surgical History:  Procedure Laterality Date  . COLONOSCOPY  06/2010  Dr. Laural Golden  . DENTAL SURGERY     Right jaw; continued chronic pain  . NASAL SINUS SURGERY    . stents     cardiac stent  . ULNAR NERVE REPAIR     right arm       Home Medications    Prior to Admission medications   Medication Sig Start Date End Date Taking? Authorizing Provider  albuterol (PROVENTIL HFA;VENTOLIN HFA) 108 (90 Base) MCG/ACT inhaler Inhale 1-2 puffs into the lungs every 6 (six) hours as needed for wheezing or shortness of breath. 04/19/16  Yes Nat Christen, MD    ALPRAZolam Duanne Moron) 1 MG tablet Take one tablet by mouth in the morning and two tablets at bedtime for anxiety/rest Patient taking differently: Take 1-2 mg by mouth 2 (two) times daily. Take one tablet by mouth in the morning and two tablets at bedtime for anxiety/rest 08/07/14  Yes Reed, Tiffany L, DO  aspirin 81 MG EC tablet Take 81 mg by mouth daily.     Yes [provider]  calcium carbonate (ANTACID CALCIUM) 500 MG chewable tablet Chew 2 tablets by mouth 3 (three) times daily with meals.   Yes [provider]  cloNIDine (CATAPRES) 0.1 MG tablet Take 0.1 mg by mouth 2 (two) times daily.  09/22/11  Yes [provider]  dextromethorphan-guaiFENesin (TUSSIN DM) 10-100 MG/5ML liquid Take 10 mLs by mouth every 4 (four) hours as needed for cough.   Yes [provider]  famotidine (PEPCID) 20 MG tablet Take 20 mg by mouth daily.   Yes [provider]  ferrous sulfate 325 (65 FE) MG tablet Take 1 tablet (325 mg total) by mouth 2 (two) times daily with a meal. 08/04/14  Yes Black, Lezlie Octave, NP  glipiZIDE (GLUCOTROL) 5 MG tablet Take 5 mg by mouth daily.    Yes [provider]  levothyroxine (SYNTHROID, LEVOTHROID) 112 MCG tablet Take 112 mcg by mouth daily before breakfast.   Yes [provider]  losartan (COZAAR) 50 MG tablet Take 50 mg by mouth daily.  05/09/14  Yes [provider]  Melatonin 3 MG CAPS Take 6 mg by mouth at bedtime.   Yes [provider]  memantine (NAMENDA) 10 MG tablet Take 10 mg by mouth 2 (two) times daily. 07/21/14  Yes [provider]  metFORMIN (GLUCOPHAGE) 500 MG tablet Take 500 mg by mouth daily.   Yes [provider]  nitroGLYCERIN (NITROSTAT) 0.4 MG SL tablet Place 0.4 mg under the tongue every 5 (five) minutes as needed for chest pain.    Yes [provider]  omeprazole (PRILOSEC) 40 MG capsule Take 40 mg by mouth daily.   Yes [provider]  senna (SENOKOT) 8.6 MG  TABS tablet Take 1 tablet by mouth daily as needed for mild constipation.   Yes [provider]  tamsulosin (FLOMAX) 0.4 MG CAPS capsule Take 1 capsule (0.4 mg total) by mouth at bedtime. 08/04/14  Yes Black, Lezlie Octave, NP    Family History Family History  Problem Relation Age of Onset  . Hypertension Mother   . Heart disease Father   . Hyperlipidemia Father   . Hypertension Father   . Diabetes Father     Social History Social History  Substance Use Topics  . Smoking status: Former Smoker    Packs/day: 1.00    Years: 60.00    Types: Cigarettes    Quit date: 04/21/2011  . Smokeless tobacco: Never Used  . Alcohol use  No     Comment: Former Abuse     Allergies   Prednisone   Review of Systems Review of Systems   All systems reviewed and negative, other than as noted in HPI.   All systems reviewed and negative, other than as noted in HPI. Physical Exam Updated Vital Signs BP 123/69   Pulse 64   Temp 98 F (36.7 C) (Oral)   Resp 20   SpO2 92%   Physical Exam  Constitutional: He appears well-developed and well-nourished. No distress.  HENT:  Head: Normocephalic and atraumatic.  Eyes: Conjunctivae are normal. Pupils are equal, round, and reactive to light. Right eye exhibits no discharge. Left eye exhibits no discharge.  Neck: Neck supple.  Cardiovascular: Normal rate, regular rhythm and normal heart sounds.  Exam reveals no gallop and no friction rub.   No murmur heard. Pulmonary/Chest: Effort normal and breath sounds normal. No respiratory distress.  Abdominal: Soft. He exhibits no distension. There is no tenderness.  Soft. NT. Bladder doesn't feel distended.   Musculoskeletal: He exhibits no edema or tenderness.  Neurological: He is alert.  Speech is slow and a little hard to follow at times. Content is mostly appropriate but gets confused when trying to recall past history/events.   Skin: Skin is warm and dry.  Psychiatric: He has a normal mood and  affect. His behavior is normal. Thought content normal.  Nursing note and vitals reviewed.    ED Treatments / Results  Labs (all labs ordered are listed, but only abnormal results are displayed) Labs Reviewed  BASIC METABOLIC PANEL - Abnormal; Notable for the following:       Result Value   Chloride 100 (*)    BUN 26 (*)    Creatinine, Ser 1.36 (*)    GFR calc non Af Amer 47 (*)    GFR calc Af Amer 55 (*)    All other components within normal limits  URINALYSIS, ROUTINE W REFLEX MICROSCOPIC  CBC WITH DIFFERENTIAL/PLATELET    EKG  EKG Interpretation None       Radiology Dg Chest 2 View  Result Date: 06/24/2016 CLINICAL DATA:  Decreased oxygen saturation. EXAM: CHEST  2 VIEW COMPARISON:  PA and lateral chest 04/19/2016 and 06/09/2015. FINDINGS: Lung volumes are somewhat low. Lungs are clear. No pneumothorax or pleural effusion. Heart size is upper normal. Atherosclerosis noted. IMPRESSION: No acute disease. Atherosclerosis. Electronically Signed   By: Inge Rise M.D.   On: 06/24/2016 13:06    Procedures Procedures (including critical care time)  Medications Ordered in ED Medications - No data to display   Initial Impression / Assessment and Plan / ED Course  I have reviewed the triage vital signs and the nursing notes.  Pertinent labs & imaging results that were available during my care of the patient were reviewed by me and considered in my medical decision making (see chart for details).     83yM with what I suspect is reflux. I cannot clearly relate his symptoms to urination aside from if it's actually worse with straining/valsalva. He also relates symptoms to being worse at night, improvement with Tums, etc which gives additional support to this. He is a little hypoxic although he denies dyspnea. He does not have increased WOB on exam. Afebrile. Denies cough. I suspect he is probably aspirating.    W/u pretty unremarkable. CXR clear. Labs ok. Known CKD and  appears to be at-or-close to baseline.   Final Clinical Impressions(s) / ED Diagnoses  Final diagnoses:  Gastroesophageal reflux disease, esophagitis presence not specified    New Prescriptions New Prescriptions   No medications on file     Virgel Manifold, MD 06/24/16 [phone]

## 2016-07-30 ENCOUNTER — Encounter (INDEPENDENT_AMBULATORY_CARE_PROVIDER_SITE_OTHER): Payer: Self-pay | Admitting: Internal Medicine

## 2016-08-18 ENCOUNTER — Ambulatory Visit (INDEPENDENT_AMBULATORY_CARE_PROVIDER_SITE_OTHER): Payer: Self-pay | Admitting: Internal Medicine

## 2016-09-05 ENCOUNTER — Ambulatory Visit (INDEPENDENT_AMBULATORY_CARE_PROVIDER_SITE_OTHER): Payer: Self-pay | Admitting: Internal Medicine

## 2016-09-05 ENCOUNTER — Ambulatory Visit (INDEPENDENT_AMBULATORY_CARE_PROVIDER_SITE_OTHER): Payer: Medicare Other | Admitting: Internal Medicine

## 2016-09-05 ENCOUNTER — Encounter (INDEPENDENT_AMBULATORY_CARE_PROVIDER_SITE_OTHER): Payer: Self-pay | Admitting: *Deleted

## 2016-09-05 ENCOUNTER — Encounter (INDEPENDENT_AMBULATORY_CARE_PROVIDER_SITE_OTHER): Payer: Self-pay | Admitting: Internal Medicine

## 2016-09-05 VITALS — BP 144/70 | HR 65 | Temp 97.4°F | Ht 69.0 in | Wt 182.2 lb

## 2016-09-05 DIAGNOSIS — R1310 Dysphagia, unspecified: Secondary | ICD-10-CM

## 2016-09-05 DIAGNOSIS — R1319 Other dysphagia: Secondary | ICD-10-CM

## 2016-09-05 NOTE — Progress Notes (Signed)
Subjective:    Patient ID: [name], male    DOB: 1933/11/09, 83 y.o.   MRN: [id number]  HPI Referred by Dr. Legrand Rams for dysphagia. Resident of [employer] [employer]. He tells me he is having trouble swallowing and keeping the food down.  Denies choking on any foods. He is on a regular diet at [facility]. Wt 04/19/2016 210. Today his weight is 182. Caregiver in room states he eats fairly well.  BMs are normal.  (Please note, patient cannot really give me a hx concerning this complaint)   07/16/2011 EGD/Colonoscopy:FINAL DIAGNOSES:  1. Normal esophagogastroduodenoscopy.  2. Colonoscopy completed to cecum.  3. Multiple diverticula at sigmoid colon.  4. A 6-mm adenomatous polyp snared from the rectum Review of Systems     Past Medical History:  Diagnosis Date  . Alcohol abuse, in remission   . Anemia   . ASCVD (arteriosclerotic cardiovascular disease)     Inferior myocardial infarction in 08/1998 requiring PCI of the RCA; moderate residual disease in the left anterior descending and first diagonal; ejection fraction of 45%  . Cerebrovascular disease     Right carotid bruit; plaque without stenosis in 2003 and 2005  . COPD (chronic obstructive pulmonary disease) ([place])   . Diabetes mellitus    A1c-7.7 in 2004 with diet-controlled; 6.3 and 11/08 on oral medication  . Elevated PSA    prior negative prostate biopsy  . Hyperlipidemia   . Hypertension   . Hypothyroid   . MI, old   . Tobacco abuse    Consumption tapered to 2 packs per week  . Urinary retention    07/2014    Past Surgical History:  Procedure Laterality Date  . COLONOSCOPY  06/2010   Dr. Laural Golden  . DENTAL SURGERY     Right jaw; continued chronic pain  . NASAL SINUS SURGERY    . stents     cardiac stent  . ULNAR NERVE REPAIR     right arm    Allergies  Allergen Reactions  . Prednisone     Doesn't like to take high doses    Current Outpatient Prescriptions on File Prior to Visit  Medication Sig Dispense Refill    . ALPRAZolam (XANAX) 1 MG tablet Take one tablet by mouth in the morning and two tablets at bedtime for anxiety/rest (Patient taking differently: Take 1-2 mg by mouth 2 (two) times daily. Take one tablet by mouth in the morning and two tablets at bedtime for anxiety/rest) 90 tablet 5  . calcium carbonate (ANTACID CALCIUM) 500 MG chewable tablet Chew 2 tablets by mouth 3 (three) times daily with meals.    . cloNIDine (CATAPRES) 0.1 MG tablet Take 0.1 mg by mouth 2 (two) times daily.     . famotidine (PEPCID) 20 MG tablet Take 20 mg by mouth daily.    . ferrous sulfate 325 (65 FE) MG tablet Take 1 tablet (325 mg total) by mouth 2 (two) times daily with a meal.  3  . glipiZIDE (GLUCOTROL) 5 MG tablet Take 5 mg by mouth daily.     Marland Kitchen levothyroxine (SYNTHROID, LEVOTHROID) 112 MCG tablet Take 112 mcg by mouth daily before breakfast.    . losartan (COZAAR) 50 MG tablet Take 50 mg by mouth daily.   10  . Melatonin 3 MG CAPS Take 6 mg by mouth at bedtime.    . memantine (NAMENDA) 10 MG tablet Take 10 mg by mouth 2 (two) times daily.  5  . metFORMIN (GLUCOPHAGE)  500 MG tablet Take 500 mg by mouth daily.    . nitroGLYCERIN (NITROSTAT) 0.4 MG SL tablet Place 0.4 mg under the tongue every 5 (five) minutes as needed for chest pain.     Marland Kitchen omeprazole (PRILOSEC) 40 MG capsule Take 40 mg by mouth daily.    Marland Kitchen senna (SENOKOT) 8.6 MG TABS tablet Take 1 tablet by mouth daily as needed for mild constipation.    . tamsulosin (FLOMAX) 0.4 MG CAPS capsule Take 1 capsule (0.4 mg total) by mouth at bedtime. 30 capsule 0  . albuterol (PROVENTIL HFA;VENTOLIN HFA) 108 (90 Base) MCG/ACT inhaler Inhale 1-2 puffs into the lungs every 6 (six) hours as needed for wheezing or shortness of breath. 1 Inhaler 0  . aspirin 81 MG EC tablet Take 81 mg by mouth daily.      Marland Kitchen dextromethorphan-guaiFENesin (TUSSIN DM) 10-100 MG/5ML liquid Take 10 mLs by mouth every 4 (four) hours as needed for cough.     No current facility-administered  medications on file prior to visit.      Objective:   Physical Exam Blood pressure (!) 144/70, pulse 65, temperature (!) 97.4 F (36.3 C), height 5\' 9"  (1.753 m), weight 182 lb 3.2 oz (82.6 kg). Alert and oriented. Skin warm and dry. Oral mucosa is moist.   . Sclera anicteric, conjunctivae is pink. Thyroid not enlarged. No cervical lymphadenopathy. Lungs clear. Heart regular rate and rhythm.  Abdomen is soft. Bowel sounds are positive. No hepatomegaly. No abdominal masses felt. No tenderness.  No edema to lower extremities.          Assessment & Plan:  Dysphagia. Am going to get an Esophagram.

## 2016-09-05 NOTE — Patient Instructions (Signed)
Am going to get a DG esophagram

## 2016-09-09 ENCOUNTER — Ambulatory Visit (HOSPITAL_COMMUNITY): Admission: RE | Admit: 2016-09-09 | Payer: Medicare Other | Source: Ambulatory Visit

## 2016-09-10 ENCOUNTER — Inpatient Hospital Stay (HOSPITAL_COMMUNITY): Payer: Medicare Other

## 2016-09-10 ENCOUNTER — Inpatient Hospital Stay (HOSPITAL_COMMUNITY)
Admission: EM | Admit: 2016-09-10 | Discharge: 2016-10-13 | DRG: 871 | Disposition: E | Payer: Medicare Other | Attending: Pulmonary Disease | Admitting: Pulmonary Disease

## 2016-09-10 ENCOUNTER — Emergency Department (HOSPITAL_COMMUNITY): Payer: Medicare Other

## 2016-09-10 ENCOUNTER — Encounter (HOSPITAL_COMMUNITY): Payer: Self-pay

## 2016-09-10 DIAGNOSIS — R05 Cough: Secondary | ICD-10-CM

## 2016-09-10 DIAGNOSIS — E1122 Type 2 diabetes mellitus with diabetic chronic kidney disease: Secondary | ICD-10-CM | POA: Diagnosis present

## 2016-09-10 DIAGNOSIS — Z452 Encounter for adjustment and management of vascular access device: Secondary | ICD-10-CM

## 2016-09-10 DIAGNOSIS — N183 Chronic kidney disease, stage 3 unspecified: Secondary | ICD-10-CM | POA: Diagnosis present

## 2016-09-10 DIAGNOSIS — E039 Hypothyroidism, unspecified: Secondary | ICD-10-CM | POA: Diagnosis present

## 2016-09-10 DIAGNOSIS — N179 Acute kidney failure, unspecified: Secondary | ICD-10-CM | POA: Diagnosis present

## 2016-09-10 DIAGNOSIS — Z8349 Family history of other endocrine, nutritional and metabolic diseases: Secondary | ICD-10-CM | POA: Diagnosis not present

## 2016-09-10 DIAGNOSIS — J189 Pneumonia, unspecified organism: Secondary | ICD-10-CM

## 2016-09-10 DIAGNOSIS — E1121 Type 2 diabetes mellitus with diabetic nephropathy: Secondary | ICD-10-CM | POA: Diagnosis present

## 2016-09-10 DIAGNOSIS — Z833 Family history of diabetes mellitus: Secondary | ICD-10-CM | POA: Diagnosis not present

## 2016-09-10 DIAGNOSIS — Z87891 Personal history of nicotine dependence: Secondary | ICD-10-CM | POA: Diagnosis not present

## 2016-09-10 DIAGNOSIS — G9340 Encephalopathy, unspecified: Secondary | ICD-10-CM | POA: Diagnosis not present

## 2016-09-10 DIAGNOSIS — R600 Localized edema: Secondary | ICD-10-CM

## 2016-09-10 DIAGNOSIS — R0902 Hypoxemia: Secondary | ICD-10-CM

## 2016-09-10 DIAGNOSIS — I252 Old myocardial infarction: Secondary | ICD-10-CM | POA: Diagnosis not present

## 2016-09-10 DIAGNOSIS — N3000 Acute cystitis without hematuria: Secondary | ICD-10-CM

## 2016-09-10 DIAGNOSIS — F0280 Dementia in other diseases classified elsewhere without behavioral disturbance: Secondary | ICD-10-CM | POA: Diagnosis present

## 2016-09-10 DIAGNOSIS — E1165 Type 2 diabetes mellitus with hyperglycemia: Secondary | ICD-10-CM | POA: Diagnosis present

## 2016-09-10 DIAGNOSIS — Z7982 Long term (current) use of aspirin: Secondary | ICD-10-CM

## 2016-09-10 DIAGNOSIS — Z7189 Other specified counseling: Secondary | ICD-10-CM | POA: Diagnosis not present

## 2016-09-10 DIAGNOSIS — E1151 Type 2 diabetes mellitus with diabetic peripheral angiopathy without gangrene: Secondary | ICD-10-CM | POA: Diagnosis present

## 2016-09-10 DIAGNOSIS — J690 Pneumonitis due to inhalation of food and vomit: Secondary | ICD-10-CM | POA: Diagnosis present

## 2016-09-10 DIAGNOSIS — IMO0002 Reserved for concepts with insufficient information to code with codable children: Secondary | ICD-10-CM | POA: Diagnosis present

## 2016-09-10 DIAGNOSIS — R1310 Dysphagia, unspecified: Secondary | ICD-10-CM | POA: Diagnosis present

## 2016-09-10 DIAGNOSIS — R140 Abdominal distension (gaseous): Secondary | ICD-10-CM

## 2016-09-10 DIAGNOSIS — Z7984 Long term (current) use of oral hypoglycemic drugs: Secondary | ICD-10-CM | POA: Diagnosis not present

## 2016-09-10 DIAGNOSIS — J9601 Acute respiratory failure with hypoxia: Secondary | ICD-10-CM

## 2016-09-10 DIAGNOSIS — T380X5A Adverse effect of glucocorticoids and synthetic analogues, initial encounter: Secondary | ICD-10-CM | POA: Diagnosis present

## 2016-09-10 DIAGNOSIS — N390 Urinary tract infection, site not specified: Secondary | ICD-10-CM | POA: Diagnosis present

## 2016-09-10 DIAGNOSIS — J449 Chronic obstructive pulmonary disease, unspecified: Secondary | ICD-10-CM

## 2016-09-10 DIAGNOSIS — Z66 Do not resuscitate: Secondary | ICD-10-CM | POA: Diagnosis present

## 2016-09-10 DIAGNOSIS — Z8249 Family history of ischemic heart disease and other diseases of the circulatory system: Secondary | ICD-10-CM | POA: Diagnosis not present

## 2016-09-10 DIAGNOSIS — A419 Sepsis, unspecified organism: Principal | ICD-10-CM | POA: Diagnosis present

## 2016-09-10 DIAGNOSIS — E785 Hyperlipidemia, unspecified: Secondary | ICD-10-CM | POA: Diagnosis present

## 2016-09-10 DIAGNOSIS — Z515 Encounter for palliative care: Secondary | ICD-10-CM | POA: Diagnosis not present

## 2016-09-10 DIAGNOSIS — R0603 Acute respiratory distress: Secondary | ICD-10-CM

## 2016-09-10 DIAGNOSIS — J441 Chronic obstructive pulmonary disease with (acute) exacerbation: Secondary | ICD-10-CM | POA: Diagnosis present

## 2016-09-10 DIAGNOSIS — K219 Gastro-esophageal reflux disease without esophagitis: Secondary | ICD-10-CM | POA: Diagnosis present

## 2016-09-10 DIAGNOSIS — F419 Anxiety disorder, unspecified: Secondary | ICD-10-CM | POA: Diagnosis present

## 2016-09-10 DIAGNOSIS — J9690 Respiratory failure, unspecified, unspecified whether with hypoxia or hypercapnia: Secondary | ICD-10-CM

## 2016-09-10 DIAGNOSIS — I130 Hypertensive heart and chronic kidney disease with heart failure and stage 1 through stage 4 chronic kidney disease, or unspecified chronic kidney disease: Secondary | ICD-10-CM | POA: Diagnosis present

## 2016-09-10 DIAGNOSIS — J9621 Acute and chronic respiratory failure with hypoxia: Secondary | ICD-10-CM | POA: Diagnosis present

## 2016-09-10 DIAGNOSIS — I509 Heart failure, unspecified: Secondary | ICD-10-CM

## 2016-09-10 DIAGNOSIS — I2510 Atherosclerotic heart disease of native coronary artery without angina pectoris: Secondary | ICD-10-CM | POA: Diagnosis present

## 2016-09-10 DIAGNOSIS — R059 Cough, unspecified: Secondary | ICD-10-CM

## 2016-09-10 DIAGNOSIS — G309 Alzheimer's disease, unspecified: Secondary | ICD-10-CM | POA: Diagnosis present

## 2016-09-10 DIAGNOSIS — I679 Cerebrovascular disease, unspecified: Secondary | ICD-10-CM | POA: Diagnosis present

## 2016-09-10 HISTORY — DX: Peripheral vascular disease, unspecified: I73.9

## 2016-09-10 HISTORY — DX: Unspecified dementia, unspecified severity, without behavioral disturbance, psychotic disturbance, mood disturbance, and anxiety: F03.90

## 2016-09-10 LAB — CBC WITH DIFFERENTIAL/PLATELET
BASOS ABS: 0.1 10*3/uL (ref 0.0–0.1)
BASOS PCT: 1 %
Eosinophils Absolute: 0 10*3/uL (ref 0.0–0.7)
Eosinophils Relative: 0 %
HEMATOCRIT: 43.9 % (ref 39.0–52.0)
Hemoglobin: 14.3 g/dL (ref 13.0–17.0)
Lymphocytes Relative: 7 %
Lymphs Abs: 0.9 10*3/uL (ref 0.7–4.0)
MCH: 31.1 pg (ref 26.0–34.0)
MCHC: 32.6 g/dL (ref 30.0–36.0)
MCV: 95.4 fL (ref 78.0–100.0)
MONO ABS: 1.1 10*3/uL — AB (ref 0.1–1.0)
Monocytes Relative: 8 %
Neutro Abs: 10.7 10*3/uL — ABNORMAL HIGH (ref 1.7–7.7)
Neutrophils Relative %: 84 %
PLATELETS: 230 10*3/uL (ref 150–400)
RBC: 4.6 MIL/uL (ref 4.22–5.81)
RDW: 14.4 % (ref 11.5–15.5)
WBC: 12.7 10*3/uL — ABNORMAL HIGH (ref 4.0–10.5)

## 2016-09-10 LAB — PROTIME-INR
INR: 1.05
PROTHROMBIN TIME: 13.6 s (ref 11.4–15.2)

## 2016-09-10 LAB — URINALYSIS, ROUTINE W REFLEX MICROSCOPIC
BACTERIA UA: NONE SEEN
BILIRUBIN URINE: NEGATIVE
GLUCOSE, UA: NEGATIVE mg/dL
KETONES UR: NEGATIVE mg/dL
NITRITE: NEGATIVE
PROTEIN: 100 mg/dL — AB
Specific Gravity, Urine: 1.017 (ref 1.005–1.030)
pH: 5 (ref 5.0–8.0)

## 2016-09-10 LAB — COMPREHENSIVE METABOLIC PANEL
ALBUMIN: 3.8 g/dL (ref 3.5–5.0)
ALK PHOS: 53 U/L (ref 38–126)
ALT: 12 U/L — ABNORMAL LOW (ref 17–63)
ANION GAP: 11 (ref 5–15)
AST: 20 U/L (ref 15–41)
BILIRUBIN TOTAL: 0.5 mg/dL (ref 0.3–1.2)
BUN: 29 mg/dL — ABNORMAL HIGH (ref 6–20)
CALCIUM: 9.1 mg/dL (ref 8.9–10.3)
CO2: 28 mmol/L (ref 22–32)
Chloride: 101 mmol/L (ref 101–111)
Creatinine, Ser: 1.75 mg/dL — ABNORMAL HIGH (ref 0.61–1.24)
GFR, EST AFRICAN AMERICAN: 40 mL/min — AB (ref >60)
GFR, EST NON AFRICAN AMERICAN: 35 mL/min — AB (ref >60)
GLUCOSE: 110 mg/dL — AB (ref 65–99)
Potassium: 4.4 mmol/L (ref 3.5–5.1)
Sodium: 140 mmol/L (ref 135–145)
TOTAL PROTEIN: 7.2 g/dL (ref 6.5–8.1)

## 2016-09-10 LAB — LACTIC ACID, PLASMA
LACTIC ACID, VENOUS: 2.1 mmol/L — AB (ref 0.5–1.9)
LACTIC ACID, VENOUS: 2.1 mmol/L — AB (ref 0.5–1.9)

## 2016-09-10 LAB — MRSA PCR SCREENING: MRSA by PCR: NEGATIVE

## 2016-09-10 LAB — APTT: APTT: 33 s (ref 24–36)

## 2016-09-10 LAB — GLUCOSE, CAPILLARY
GLUCOSE-CAPILLARY: 150 mg/dL — AB (ref 65–99)
Glucose-Capillary: 121 mg/dL — ABNORMAL HIGH (ref 65–99)

## 2016-09-10 LAB — I-STAT CG4 LACTIC ACID, ED: Lactic Acid, Venous: 3.04 mmol/L (ref 0.5–1.9)

## 2016-09-10 LAB — PROCALCITONIN

## 2016-09-10 MED ORDER — ONDANSETRON HCL 4 MG PO TABS: 4.0000 mg | ORAL_TABLET | Freq: Four times a day (QID) | ORAL | Status: DC | PRN

## 2016-09-10 MED ORDER — SODIUM CHLORIDE 0.9% FLUSH
3.0000 mL | Freq: Two times a day (BID) | INTRAVENOUS | Status: DC
  Administered 2016-09-11 – 2016-09-16 (×9): 3 mL via INTRAVENOUS

## 2016-09-10 MED ORDER — ALBUTEROL SULFATE (2.5 MG/3ML) 0.083% IN NEBU
2.5000 mg | INHALATION_SOLUTION | RESPIRATORY_TRACT | Status: DC | PRN
  Administered 2016-09-14: 2.5 mg via RESPIRATORY_TRACT
  Filled 2016-09-10: qty 3

## 2016-09-10 MED ORDER — PANTOPRAZOLE SODIUM 40 MG IV SOLR
40.0000 mg | INTRAVENOUS | Status: DC
  Administered 2016-09-10 – 2016-09-16 (×7): 40 mg via INTRAVENOUS
  Filled 2016-09-10 (×7): qty 40

## 2016-09-10 MED ORDER — ONDANSETRON HCL 4 MG/2ML IJ SOLN: 4.0000 mg | Freq: Four times a day (QID) | INTRAMUSCULAR | Status: DC | PRN

## 2016-09-10 MED ORDER — IPRATROPIUM-ALBUTEROL 0.5-2.5 (3) MG/3ML IN SOLN: 3.0000 mL | Freq: Four times a day (QID) | RESPIRATORY_TRACT | Status: DC

## 2016-09-10 MED ORDER — VANCOMYCIN HCL IN DEXTROSE 1-5 GM/200ML-% IV SOLN
1000.0000 mg | Freq: Once | INTRAVENOUS | Status: AC
  Administered 2016-09-10: 1000 mg via INTRAVENOUS
  Filled 2016-09-10: qty 200

## 2016-09-10 MED ORDER — ENOXAPARIN SODIUM 40 MG/0.4ML ~~LOC~~ SOLN
40.0000 mg | SUBCUTANEOUS | Status: DC
  Administered 2016-09-10 – 2016-09-16 (×7): 40 mg via SUBCUTANEOUS
  Filled 2016-09-10 (×7): qty 0.4

## 2016-09-10 MED ORDER — ACETAMINOPHEN 500 MG PO TABS
1000.0000 mg | ORAL_TABLET | Freq: Once | ORAL | Status: AC
  Administered 2016-09-10: 1000 mg via ORAL
  Filled 2016-09-10: qty 2

## 2016-09-10 MED ORDER — ACETYLCYSTEINE 20 % IN SOLN: 3.0000 mL | RESPIRATORY_TRACT | Status: DC

## 2016-09-10 MED ORDER — SODIUM CHLORIDE 0.9 % IV SOLN
INTRAVENOUS | Status: DC
  Administered 2016-09-10 – 2016-09-12 (×5): via INTRAVENOUS

## 2016-09-10 MED ORDER — LORAZEPAM 2 MG/ML IJ SOLN
0.5000 mg | Freq: Four times a day (QID) | INTRAMUSCULAR | Status: DC | PRN
  Administered 2016-09-10 – 2016-09-12 (×5): 0.5 mg via INTRAVENOUS
  Filled 2016-09-10 (×7): qty 1

## 2016-09-10 MED ORDER — LEVOTHYROXINE SODIUM 100 MCG IV SOLR
50.0000 ug | Freq: Every day | INTRAVENOUS | Status: DC
  Administered 2016-09-11 – 2016-09-17 (×7): 50 ug via INTRAVENOUS
  Filled 2016-09-10 (×7): qty 5

## 2016-09-10 MED ORDER — ACETYLCYSTEINE 20 % IN SOLN: 3.0000 mL | Freq: Three times a day (TID) | RESPIRATORY_TRACT | Status: DC

## 2016-09-10 MED ORDER — VANCOMYCIN HCL IN DEXTROSE 1-5 GM/200ML-% IV SOLN
1000.0000 mg | INTRAVENOUS | Status: DC
Start: 2016-09-11 — End: 2016-09-10

## 2016-09-10 MED ORDER — ACETAMINOPHEN 650 MG RE SUPP: 650.0000 mg | Freq: Four times a day (QID) | RECTAL | Status: DC | PRN

## 2016-09-10 MED ORDER — ACETAMINOPHEN 325 MG PO TABS
650.0000 mg | ORAL_TABLET | Freq: Four times a day (QID) | ORAL | Status: DC | PRN
  Administered 2016-09-11: 650 mg via ORAL
  Filled 2016-09-10: qty 2

## 2016-09-10 MED ORDER — IPRATROPIUM-ALBUTEROL 0.5-2.5 (3) MG/3ML IN SOLN
3.0000 mL | RESPIRATORY_TRACT | Status: DC
  Administered 2016-09-10 – 2016-09-13 (×19): 3 mL via RESPIRATORY_TRACT
  Filled 2016-09-10 (×19): qty 3

## 2016-09-10 MED ORDER — METHYLPREDNISOLONE SODIUM SUCC 40 MG IJ SOLR
40.0000 mg | Freq: Two times a day (BID) | INTRAMUSCULAR | Status: DC
  Administered 2016-09-10 – 2016-09-16 (×12): 40 mg via INTRAVENOUS
  Filled 2016-09-10 (×12): qty 1

## 2016-09-10 MED ORDER — SODIUM CHLORIDE 0.9 % IV BOLUS (SEPSIS)
1000.0000 mL | Freq: Once | INTRAVENOUS | Status: AC
  Administered 2016-09-10: 1000 mL via INTRAVENOUS

## 2016-09-10 MED ORDER — PIPERACILLIN-TAZOBACTAM 3.375 G IVPB 30 MIN
3.3750 g | Freq: Once | INTRAVENOUS | Status: AC
  Administered 2016-09-10: 3.375 g via INTRAVENOUS
  Filled 2016-09-10: qty 50

## 2016-09-10 MED ORDER — ALBUTEROL SULFATE (2.5 MG/3ML) 0.083% IN NEBU
2.5000 mg | INHALATION_SOLUTION | Freq: Once | RESPIRATORY_TRACT | Status: AC
  Administered 2016-09-10: 2.5 mg via RESPIRATORY_TRACT
  Filled 2016-09-10: qty 3

## 2016-09-10 MED ORDER — ACETYLCYSTEINE 20 % IN SOLN
3.0000 mL | RESPIRATORY_TRACT | Status: DC
  Administered 2016-09-10 – 2016-09-12 (×12): 3 mL via RESPIRATORY_TRACT
  Administered 2016-09-12: 4 mL via RESPIRATORY_TRACT
  Administered 2016-09-12 – 2016-09-13 (×4): 3 mL via RESPIRATORY_TRACT
  Filled 2016-09-10 (×17): qty 4

## 2016-09-10 MED ORDER — PIPERACILLIN-TAZOBACTAM 3.375 G IVPB
3.3750 g | Freq: Three times a day (TID) | INTRAVENOUS | Status: DC
  Administered 2016-09-10 – 2016-09-17 (×21): 3.375 g via INTRAVENOUS
  Filled 2016-09-10 (×20): qty 50

## 2016-09-10 NOTE — Progress Notes (Signed)
Critical LA reported to Dr. Sarajane Jews.  Patient has very thick secretions that are difficult for him to cough up. Assisted with suctioning out and got good amount of sputum up but patient still seems to have a lot in his throat.   Patient O2 saturations hanging at 88% on nonrebreather. Patient is having difficulty understanding to breath through his nose. Abigail Butts, RT on the way down to assess patient. Will continue to closely monitor.

## 2016-09-10 NOTE — ED Provider Notes (Signed)
Emergency Department Provider Note   I have reviewed the triage vital signs and the nursing notes.   HISTORY  Chief Complaint Fever  Level 5 caveat: Confusion and Fever  HPI ISABELLA IDA is a 83 y.o. male with PMH of COPD, Dementia, DM, HLD, HTN, and CAD presents to the emergency department for evaluation of new onset fever and confusion. He is coming to [place] from a skilled nursing facility who noted a fever today. They state he is more confused than normal. They noted some increased congestion but no other symptoms. The patient denies any pain or difficulty breathing.History and review of systems are significantly limited by the patient's underlying dementia and active fever with some additional confusion.   Past Medical History:  Diagnosis Date  . Alcohol abuse, in remission   . Anemia   . ASCVD (arteriosclerotic cardiovascular disease)     Inferior myocardial infarction in 08/1998 requiring PCI of the RCA; moderate residual disease in the left anterior descending and first diagonal; ejection fraction of 45%  . Cerebrovascular disease     Right carotid bruit; plaque without stenosis in 2003 and 2005  . COPD (chronic obstructive pulmonary disease) ([place])   . Dementia   . Diabetes mellitus    A1c-7.7 in 2004 with diet-controlled; 6.3 and 11/08 on oral medication  . Elevated PSA    prior negative prostate biopsy  . Hyperlipidemia   . Hypertension   . Hypothyroid   . MI, old   . Peripheral vascular disease ([place])   . Tobacco abuse    Consumption tapered to 2 packs per week  . Urinary retention    07/2014    Patient Active Problem List   Diagnosis Date Noted  . Sepsis ([place]) 09/08/2016  . CKD (chronic kidney disease), stage III 06/11/2015  . Chronic respiratory failure with hypoxia ([place]) 06/11/2015  . Elevated troponin I level 06/11/2015  . Diabetes mellitus with nephropathy ([place]) 06/11/2015  . Senile dementia with delirium 06/10/2015  . Benzodiazepine withdrawal ([place])  06/10/2015  . Confusion   . Urinary retention 08/03/2014  . Hypercalcemia 08/02/2014  . Dehydration 08/02/2014  . Lower urinary tract infectious disease 08/02/2014  . Subacute delirium 08/02/2014  . COPD (chronic obstructive pulmonary disease) ([place]) 08/02/2014  . Acute respiratory failure with hypoxia ([place]) 08/02/2014  . Elevated AST (SGOT) 08/02/2014  . Acute encephalopathy 06/11/2013  . Hypoxia 02/12/2013  . Chronic kidney disease, stage 3 03/01/2012  . Peripheral vascular disease ([place]) 06/12/2010  . ASCVD (arteriosclerotic cardiovascular disease)   . Cerebrovascular disease   . Diabetes mellitus type 2 with complications ([place])   . Tobacco abuse   . Elevated PSA   . Alcohol abuse, in remission   . Anemia, normocytic normochromic 05/22/2010  . Hypertension 05/13/2010  . HYPERLIPIDEMIA 01/09/2010  . Hypothyroidism 07/18/2008    Past Surgical History:  Procedure Laterality Date  . COLONOSCOPY  06/2010   Dr. Laural Golden  . DENTAL SURGERY     Right jaw; continued chronic pain  . NASAL SINUS SURGERY    . stents     cardiac stent  . ULNAR NERVE REPAIR     right arm      Allergies Prednisone  Family History  Problem Relation Age of Onset  . Hypertension Mother   . Heart disease Father   . Hyperlipidemia Father   . Hypertension Father   . Diabetes Father     Social History Social History  Substance Use Topics  . Smoking status: Former  Smoker    Packs/day: 1.00    Years: 60.00    Types: Cigarettes    Quit date: 04/21/2011  . Smokeless tobacco: Never Used  . Alcohol use No     Comment: Former Abuse    Review of Systems  Level 5 caveat: Dementia, Fever, and increased confusion.   ____________________________________________   PHYSICAL EXAM:  VITAL SIGNS: ED Triage Vitals [08/19/2016 1200]  Enc Vitals Group     BP 117/78     Pulse Rate 94     Resp 20     Temp (!) 103.1 F (39.5 C)     Temp Source Oral     SpO2 90 %   Constitutional: Alert but confused.  Well appearing and in no acute distress. Eyes: Conjunctivae are normal.  Head: Atraumatic. Nose: No congestion/rhinnorhea. Mouth/Throat: Mucous membranes are dry.  Neck: No stridor.  Cardiovascular: Normal rate, regular rhythm. Good peripheral circulation. Grossly normal heart sounds.   Respiratory: Normal respiratory effort.  No retractions. Lungs with course sounds throughout.  Gastrointestinal: Soft and nontender. No distention.  Musculoskeletal: No lower extremity tenderness nor edema. No gross deformities of extremities. Neurologic:  Normal speech and language. No gross focal neurologic deficits are appreciated.  Skin:  Skin is warm, dry and intact. No rash noted.  ____________________________________________   LABS (all labs ordered are listed, but only abnormal results are displayed)  Labs Reviewed  COMPREHENSIVE METABOLIC PANEL - Abnormal; Notable for the following:       Result Value   Glucose, Bld 110 (*)    BUN 29 (*)    Creatinine, Ser 1.75 (*)    ALT 12 (*)    GFR calc non Af Amer 35 (*)    GFR calc Af Amer 40 (*)    All other components within normal limits  CBC WITH DIFFERENTIAL/PLATELET - Abnormal; Notable for the following:    WBC 12.7 (*)    Neutro Abs 10.7 (*)    Monocytes Absolute 1.1 (*)    All other components within normal limits  URINALYSIS, ROUTINE W REFLEX MICROSCOPIC - Abnormal; Notable for the following:    APPearance HAZY (*)    Hgb urine dipstick SMALL (*)    Protein, ur 100 (*)    Leukocytes, UA LARGE (*)    Squamous Epithelial / LPF 0-5 (*)    All other components within normal limits  LACTIC ACID, PLASMA - Abnormal; Notable for the following:    Lactic Acid, Venous 2.1 (*)    All other components within normal limits  LACTIC ACID, PLASMA - Abnormal; Notable for the following:    Lactic Acid, Venous 2.1 (*)    All other components within normal limits  COMPREHENSIVE METABOLIC PANEL - Abnormal; Notable for the following:    Glucose, Bld  169 (*)    BUN 33 (*)    Creatinine, Ser 1.81 (*)    Calcium 8.3 (*)    Total Protein 6.1 (*)    Albumin 3.1 (*)    ALT 11 (*)    Alkaline Phosphatase 33 (*)    GFR calc non Af Amer 33 (*)    GFR calc Af Amer 39 (*)    All other components within normal limits  GLUCOSE, CAPILLARY - Abnormal; Notable for the following:    Glucose-Capillary 121 (*)    All other components within normal limits  GLUCOSE, CAPILLARY - Abnormal; Notable for the following:    Glucose-Capillary 150 (*)    All other components within  normal limits  GLUCOSE, CAPILLARY - Abnormal; Notable for the following:    Glucose-Capillary 151 (*)    All other components within normal limits  I-STAT CG4 LACTIC ACID, ED - Abnormal; Notable for the following:    Lactic Acid, Venous 3.04 (*)    All other components within normal limits  CULTURE, BLOOD (ROUTINE X 2)  CULTURE, BLOOD (ROUTINE X 2)  MRSA PCR SCREENING  URINE CULTURE  CULTURE, EXPECTORATED SPUTUM-ASSESSMENT  GRAM STAIN  PROCALCITONIN  PROTIME-INR  APTT  CBC  STREP PNEUMONIAE URINARY ANTIGEN  I-STAT CG4 LACTIC ACID, ED   ____________________________________________  RADIOLOGY  Dg Chest 1 View  Result Date: 09/06/2016 CLINICAL DATA:  High fever and confusion.  Cough and weakness. EXAM: CHEST 1 VIEW COMPARISON:  06/24/2016. FINDINGS: Trachea is midline, given patient rotation. Heart size stable. Thoracic aorta is calcified. Coronary stent is noted. Probable mild scarring in the right midlung zone. Lungs are otherwise clear. No pleural fluid. IMPRESSION: 1. No acute findings. 2.  Aortic atherosclerosis (ICD10-170.0). Electronically Signed   By: Lorin Picket M.D.   On: 08/21/2016 12:54   Dg Chest Port 1 View  Result Date: 08/24/2016 CLINICAL DATA:  Hypoxia.  Abdominal distention. EXAM: PORTABLE CHEST 1 VIEW COMPARISON:  Radiographs earlier today and 06/24/2016. FINDINGS: 1633 hour. The patient's mandible overlies the upper chest. There is mild patient  rotation to the right. The heart size and mediastinal contours are stable. There is aortic atherosclerosis. There is increased interstitial prominence in both lungs, likely reflecting edema. No confluent airspace opacity, pneumothorax or significant pleural effusion. IMPRESSION: Interval increased interstitial prominence in both lungs, most likely due to edema. Fever and confusion for reported on earlier study, and these findings could be due to early aspiration. Electronically Signed   By: Richardean Sale M.D.   On: 08/30/2016 17:09   Dg Abd Portable 1v  Result Date: 08/23/2016 CLINICAL DATA:  Hypoxia and abdominal distention. EXAM: PORTABLE ABDOMEN - 1 VIEW COMPARISON:  Abdominal CT 05/26/2014. FINDINGS: 1638 hours. Two supine views are submitted. There is gas throughout the small and large bowel. No significant bowel distension, bowel wall thickening or supine evidence of free intraperitoneal air demonstrated. There is aortoiliac atherosclerosis. No acute osseous findings are seen. IMPRESSION: No radiographic evidence of acute abdominal process. Aortic atherosclerosis. Electronically Signed   By: Richardean Sale M.D.   On: 08/18/2016 17:11    ____________________________________________   PROCEDURES  Procedure(s) performed:   Procedures  CRITICAL CARE Performed by: Margette Fast Total critical care time: 45 minutes Critical care time was exclusive of separately billable procedures and treating other patients. Critical care was necessary to treat or prevent imminent or life-threatening deterioration. Critical care was time spent personally by me on the following activities: development of treatment plan with patient and/or surrogate as well as nursing, discussions with consultants, evaluation of patient's response to treatment, examination of patient, obtaining history from patient or surrogate, ordering and performing treatments and interventions, ordering and review of laboratory studies,  ordering and review of radiographic studies, pulse oximetry and re-evaluation of patient's condition.  Nanda Quinton, MD Emergency Medicine  ____________________________________________   INITIAL IMPRESSION / ASSESSMENT AND PLAN / ED COURSE  Pertinent labs & imaging results that were available during my care of the patient were reviewed by me and considered in my medical decision making (see chart for details).  Patient presents to the emergency department for evaluation of fever and increased confusion. He does have coarse breath sounds with new oxygen  requirement here in the emergency department. He has no focal neurological deficits. No abdominal tenderness to palpation. Initiated sepsis workup including chest x-ray. Will begin IV fluids and increase to 10ml/kg PRN pending lactate. No hypotension here. No signs to suggest impending septic shock.   01:15 PM Lactate elevated but < 4. No 30 ml/kg bolus. Starting abx targeting unknown source. Will make patient a CODE SEPSIS patient. Renal function near baseline.   Discussed patient's case with Hospitlaist to request admission. Patient and family (if present) updated with plan. Care transferred to Hospitalist service.  I reviewed all nursing notes, vitals, pertinent old records, EKGs, labs, imaging (as available).  ____________________________________________  FINAL CLINICAL IMPRESSION(S) / ED DIAGNOSES  Final diagnoses:  Sepsis, due to unspecified organism ([place])  Acute cystitis without hematuria     MEDICATIONS GIVEN DURING THIS VISIT:  Medications  piperacillin-tazobactam (ZOSYN) IVPB 3.375 g (0 g Intravenous Stopped 09/11/16 0947)  albuterol (PROVENTIL) (2.5 MG/3ML) 0.083% nebulizer solution 2.5 mg (not administered)  enoxaparin (LOVENOX) injection 40 mg (40 mg Subcutaneous Given 08/25/2016 1753)  sodium chloride flush (NS) 0.9 % injection 3 mL (3 mLs Intravenous Given 09/11/16 0831)  0.9 %  sodium chloride infusion ( Intravenous New  Bag/Given 09/11/16 1102)  acetaminophen (TYLENOL) tablet 650 mg (650 mg Oral Given 09/11/16 0130)    Or  acetaminophen (TYLENOL) suppository 650 mg ( Rectal See Alternative 09/11/16 0130)  ondansetron (ZOFRAN) tablet 4 mg (not administered)    Or  ondansetron (ZOFRAN) injection 4 mg (not administered)  pantoprazole (PROTONIX) injection 40 mg (40 mg Intravenous Given 08/18/2016 1754)  LORazepam (ATIVAN) injection 0.5 mg (0.5 mg Intravenous Given 09/11/16 0805)  levothyroxine (SYNTHROID, LEVOTHROID) injection 50 mcg (50 mcg Intravenous Given 09/11/16 0806)  methylPREDNISolone sodium succinate (SOLU-MEDROL) 40 mg/mL injection 40 mg (40 mg Intravenous Given 09/11/16 0526)  ipratropium-albuterol (DUONEB) 0.5-2.5 (3) MG/3ML nebulizer solution 3 mL (3 mLs Nebulization Given 09/11/16 0846)  acetylcysteine (MUCOMYST) 20 % nebulizer / oral solution 3 mL (3 mLs Nebulization Given 09/11/16 0846)  chlorhexidine (PERIDEX) 0.12 % solution 15 mL (15 mLs Mouth Rinse Given 09/11/16 0805)  MEDLINE mouth rinse (not administered)  acetaminophen (TYLENOL) tablet 1,000 mg (1,000 mg Oral Given 08/24/2016 1235)  sodium chloride 0.9 % bolus 1,000 mL (0 mLs Intravenous Stopped 09/02/2016 1439)  piperacillin-tazobactam (ZOSYN) IVPB 3.375 g (0 g Intravenous Stopped 08/24/2016 1405)  vancomycin (VANCOCIN) IVPB 1000 mg/200 mL premix (0 mg Intravenous Stopped 09/12/2016 1438)  albuterol (PROVENTIL) (2.5 MG/3ML) 0.083% nebulizer solution 2.5 mg (2.5 mg Nebulization Given 09/01/2016 1625)     NEW OUTPATIENT MEDICATIONS STARTED DURING THIS VISIT:  None   Note:  This document was prepared using Dragon voice recognition software and may include unintentional dictation errors.  Nanda Quinton, MD Emergency Medicine    Long, [facility], MD 09/11/16 [phone]

## 2016-09-10 NOTE — ED Notes (Signed)
2nd set of Blood cultures drawn at 1327.

## 2016-09-10 NOTE — H&P (Addendum)
History and Physical  [name] IWP:[id number] DOB: 1933/04/05 DOA: 09/12/2016  PCP: Rosita Fire, MD  Patient coming from: [facility]  Chief Complaint: Fever  HPI:  83 year old man PMH dementia, COPD, diabetes was sent to the emergency department for reported fever. Further evaluation revealed UTI, sepsis and the patient was admitted to the ICU.  On arrival to the ICU the patient was hypoxic in the 70s and placed on nonrebreather mask. His condition gradually improved and his oxygen saturation now in the 90s on nasal cannula.  Patient has dementia and is critically ill, right no history. He reports he currently feels fine and has no complaints. Discussed with staff at [facility], who report that the patient did have fever one episode of diarrhea but has otherwise felt well these had no recent problems. No other symptoms noted. Review of systems is not obtainable secondary to acute on chronic condition of the patient and no further information is available.  ED Course: Febrile 103.1, tachycardic, tachypneic. Treated with Tylenol, Zosyn, vancomycin  Review of Systems:  Unobtainable except as above.  Past Medical History:  Diagnosis Date  . Alcohol abuse, in remission   . Anemia   . ASCVD (arteriosclerotic cardiovascular disease)     Inferior myocardial infarction in 08/1998 requiring PCI of the RCA; moderate residual disease in the left anterior descending and first diagonal; ejection fraction of 45%  . Cerebrovascular disease     Right carotid bruit; plaque without stenosis in 2003 and 2005  . COPD (chronic obstructive pulmonary disease) ([place])   . Dementia   . Diabetes mellitus    A1c-7.7 in 2004 with diet-controlled; 6.3 and 11/08 on oral medication  . Elevated PSA    prior negative prostate biopsy  . Hyperlipidemia   . Hypertension   . Hypothyroid   . MI, old   . Peripheral vascular disease ([place])   . Tobacco abuse    Consumption tapered to 2 packs per week  . Urinary  retention    07/2014    Past Surgical History:  Procedure Laterality Date  . COLONOSCOPY  06/2010   Dr. Laural Golden  . DENTAL SURGERY     Right jaw; continued chronic pain  . NASAL SINUS SURGERY    . stents     cardiac stent  . ULNAR NERVE REPAIR     right arm     reports that he quit smoking about 5 years ago. His smoking use included Cigarettes. He has a 60.00 pack-year smoking history. He has never used smokeless tobacco. He reports that he does not drink alcohol or use drugs. Mobility: Ambulatory  Allergies  Allergen Reactions  . Prednisone     Doesn't like to take high doses    Family History  Problem Relation Age of Onset  . Hypertension Mother   . Heart disease Father   . Hyperlipidemia Father   . Hypertension Father   . Diabetes Father      Prior to Admission medications   Medication Sig Start Date End Date Taking? Authorizing Provider  ALPRAZolam Duanne Moron) 1 MG tablet Take one tablet by mouth in the morning and two tablets at bedtime for anxiety/rest Patient taking differently: Take 1-2 mg by mouth 2 (two) times daily. Take one tablet by mouth in the morning and two tablets at bedtime for anxiety/rest 08/07/14  Yes Reed, Tiffany L, DO  aspirin 81 MG EC tablet Take 81 mg by mouth daily.     Yes [provider]  calcium carbonate (ANTACID  CALCIUM) 500 MG chewable tablet Chew 2 tablets by mouth 3 (three) times daily with meals.   Yes [provider]  cloNIDine (CATAPRES) 0.1 MG tablet Take 0.1 mg by mouth 2 (two) times daily.  09/22/11  Yes [provider]  famotidine (PEPCID) 20 MG tablet Take 20 mg by mouth daily.   Yes [provider]  ferrous sulfate 325 (65 FE) MG tablet Take 1 tablet (325 mg total) by mouth 2 (two) times daily with a meal. 08/04/14  Yes Black, Lezlie Octave, NP  glipiZIDE (GLUCOTROL) 5 MG tablet Take 5 mg by mouth daily.    Yes [provider]  levothyroxine (SYNTHROID, LEVOTHROID) 112 MCG tablet Take 112 mcg by  mouth daily before breakfast.   Yes [provider]  losartan (COZAAR) 50 MG tablet Take 50 mg by mouth daily.  05/09/14  Yes [provider]  Melatonin 3 MG CAPS Take 6 mg by mouth at bedtime.   Yes [provider]  memantine (NAMENDA) 10 MG tablet Take 10 mg by mouth 2 (two) times daily. 07/21/14  Yes [provider]  metFORMIN (GLUCOPHAGE) 500 MG tablet Take 500 mg by mouth daily.   Yes [provider]  omeprazole (PRILOSEC) 40 MG capsule Take 40 mg by mouth daily.   Yes [provider]  tamsulosin (FLOMAX) 0.4 MG CAPS capsule Take 1 capsule (0.4 mg total) by mouth at bedtime. 08/04/14  Yes Black, Lezlie Octave, NP  albuterol (PROVENTIL HFA;VENTOLIN HFA) 108 (90 Base) MCG/ACT inhaler Inhale 1-2 puffs into the lungs every 6 (six) hours as needed for wheezing or shortness of breath. 04/19/16   Nat Christen, MD  dextromethorphan-guaiFENesin (TUSSIN DM) 10-100 MG/5ML liquid Take 10 mLs by mouth every 4 (four) hours as needed for cough.    [provider]  nitroGLYCERIN (NITROSTAT) 0.4 MG SL tablet Place 0.4 mg under the tongue every 5 (five) minutes as needed for chest pain.     [provider]  senna (SENOKOT) 8.6 MG TABS tablet Take 1 tablet by mouth daily as needed for mild constipation.    [provider]    Physical Exam:   Constitutional. One of 3.1, 21, 108, 106/53, 87% on high flow nasal cannula. Appears critically ill, however calm and comfortable.  Eyes. Pupils, irises, lids appear unremarkable.  ENT. Grossly normal hearing, lips, tongue.  Neck. No lymphadenopathy or masses. No thyromegaly.  Respiratory. Coarse breath sounds bilaterally, fair air movement. No wheezes or rales. Moderate increased respiratory effort. Tachypneic.  Cardiovascular. Regular rate and rhythm. No murmur, rub or gallop. No left lower extremity edema. There is 2+ right lower extremity edema.  Abdomen. Soft, nontender, no thyromegaly, no  hernias noted. Abdomen is protuberant with predominantly right-sided swelling in the soft tissue.  Skin. No rash or induration noted other than that small area of rash over the right lower leg mid tibia. Nontender to palpation.  Musculoskeletal. Appears weak but does move all extremities to command. Globally diminished strength. Digits of the upper x-rays appear unremarkable.  Psychiatric. Cannot assess.  Wt Readings from Last 3 Encounters:  09/05/16 82.6 kg (182 lb 3.2 oz)  04/19/16 95.3 kg (210 lb)  06/10/15 95.3 kg (210 lb)    I have personally reviewed following labs and imaging studies  Labs:   Basic metabolic panel unremarkable. Creatinine at baseline, 1.75. LFTs unremarkable.  Initial lactic acid 3.04  WBC 12.7, remainder CBC unremarkable.  Urinalysis grossly positive  Imaging studies:   Chest x-ray no  acute disease, independently reviewed.  Review and summation of old records:   Seen by GI August 2018 for dysphagia, difficulty swallowing food. At that time denied choking. An esophagram was ordered.  Principal Problem:   Sepsis ([place]) Active Problems:   Chronic kidney disease, stage 3   Acute encephalopathy   Lower urinary tract infectious disease   COPD (chronic obstructive pulmonary disease) (HCC)   Acute respiratory failure with hypoxia (HCC)   Diabetes mellitus with nephropathy (HCC)   Assessment/Plan Sepsis secondary to UTI -Hemodynamics currently stable. Empiric antibiotic. Follow-up culture data.  Acute hypoxic respiratory failure, suspicious for aspiration event in transit. Recently seen by GI for dysphagia. -Oxygen saturations improving. Already weaned to nasal cannula. -Stat chest x-ray, follow closely. Already on Zosyn. -Speech therapy evaluation when improved.  Acute encephalopathy superimposed on dementia. Secondary to sepsis, hypoxia, suspected aspiration event. -Supportive care, treatments directed at other issues as above.  COPD with  possible acute exacerbation -IV steroids, nebs, supplemental oxygen  Right lower extremity edema, unclear etiology -Check venous Doppler  Abdominal swelling -Unclear significance. Abdomen is completely soft, nontender. There is no hepatomegaly or hernias noted. -Check abdominal x-ray.  Diabetes mellitus type 2 -Anion gap within normal limits. Random blood sugar 110. Hold metformin, glipizide. Start sliding scale insulin.  CKD stage III -at baseline  GERD -IV PPI  Hypothyroidism -IV Synthroid  Alzheimer's dementia -Hold Xanax. IV Ativan as needed. -Resume Namenda when able to take by mouth   Severity of Illness: The appropriate patient status for this patient is INPATIENT. Inpatient status is judged to be reasonable and necessary in order to provide the required intensity of service to ensure the patient's safety. The patient's presenting symptoms, physical exam findings, and initial radiographic and laboratory data in the context of their chronic comorbidities is felt to place them at high risk for further clinical deterioration. Furthermore, it is not anticipated that the patient will be medically stable for discharge from the hospital within 2 midnights of admission. The following factors support the patient status of inpatient.   * I certify that at the point of admission it is my clinical judgment that the patient will require inpatient hospital care spanning beyond 2 midnights from the point of admission due to high intensity of service, high risk for further deterioration and high frequency of surveillance required.*     DVT prophylaxis:enoxaparin Code Status: full Family Communication: discussed by phone with HCPOA  C. Stone and Mickey Farber. They understand he is critically ill.  Time spent: 60 minutes  Murray Hodgkins, MD  [facility] Direct contact: [phone] --[place] [place]  --[email or web address]; password [email or web address]  7PM-7AM contact night coverage as  above  09/02/2016, 5:08 PM

## 2016-09-10 NOTE — ED Triage Notes (Signed)
Pt resident of [facility].  Staff reports pt has been confused today, oral temp of 107,  And difficulty walking.  Staff with pt says sounds like pt has congestion.

## 2016-09-10 NOTE — Progress Notes (Signed)
Pharmacy Note:  Initial antibiotics for Vancomycin and Zosyn ordered by EDP for sepsis.  Estimated Creatinine Clearance: 33.1 mL/min (A) (by C-G formula based on SCr of 1.75 mg/dL (H)).   Allergies  Allergen Reactions  . Prednisone     Doesn't like to take high doses    Vitals:   08/19/2016 1300 08/29/2016 1330  BP: (!) 136/45 (!) 126/42  Pulse:    Resp: 14 19  Temp:    SpO2:      Anti-infectives    Start     Dose/Rate Route Frequency Ordered Stop   08/28/2016 1330  piperacillin-tazobactam (ZOSYN) IVPB 3.375 g     3.375 g 100 mL/hr over 30 Minutes Intravenous  Once 08/23/2016 1318     08/23/2016 1330  vancomycin (VANCOCIN) IVPB 1000 mg/200 mL premix     1,000 mg 200 mL/hr over 60 Minutes Intravenous  Once 08/21/2016 1318       Plan: Initial doses of Vancomycin 1gm and Zosyn 3.375gm X 1 ordered. F/U admission orders for further dosing if therapy continued.  Ena Dawley, [facility] 08/14/2016 1:52 PM

## 2016-09-11 ENCOUNTER — Inpatient Hospital Stay (HOSPITAL_COMMUNITY): Payer: Medicare Other

## 2016-09-11 LAB — COMPREHENSIVE METABOLIC PANEL
ALBUMIN: 3.1 g/dL — AB (ref 3.5–5.0)
ALK PHOS: 33 U/L — AB (ref 38–126)
ALT: 11 U/L — ABNORMAL LOW (ref 17–63)
ANION GAP: 11 (ref 5–15)
AST: 35 U/L (ref 15–41)
BUN: 33 mg/dL — ABNORMAL HIGH (ref 6–20)
CHLORIDE: 106 mmol/L (ref 101–111)
CO2: 25 mmol/L (ref 22–32)
Calcium: 8.3 mg/dL — ABNORMAL LOW (ref 8.9–10.3)
Creatinine, Ser: 1.81 mg/dL — ABNORMAL HIGH (ref 0.61–1.24)
GFR calc non Af Amer: 33 mL/min — ABNORMAL LOW (ref >60)
GFR, EST AFRICAN AMERICAN: 39 mL/min — AB (ref >60)
GLUCOSE: 169 mg/dL — AB (ref 65–99)
POTASSIUM: 3.6 mmol/L (ref 3.5–5.1)
SODIUM: 142 mmol/L (ref 135–145)
Total Bilirubin: 0.8 mg/dL (ref 0.3–1.2)
Total Protein: 6.1 g/dL — ABNORMAL LOW (ref 6.5–8.1)

## 2016-09-11 LAB — CBC
HEMATOCRIT: 41.3 % (ref 39.0–52.0)
HEMOGLOBIN: 13.6 g/dL (ref 13.0–17.0)
MCH: 31.6 pg (ref 26.0–34.0)
MCHC: 32.9 g/dL (ref 30.0–36.0)
MCV: 96 fL (ref 78.0–100.0)
Platelets: 180 10*3/uL (ref 150–400)
RBC: 4.3 MIL/uL (ref 4.22–5.81)
RDW: 14.5 % (ref 11.5–15.5)
WBC: 7.5 10*3/uL (ref 4.0–10.5)

## 2016-09-11 LAB — STREP PNEUMONIAE URINARY ANTIGEN: STREP PNEUMO URINARY ANTIGEN: NEGATIVE

## 2016-09-11 LAB — GLUCOSE, CAPILLARY: GLUCOSE-CAPILLARY: 151 mg/dL — AB (ref 65–99)

## 2016-09-11 MED ORDER — CHLORHEXIDINE GLUCONATE 0.12 % MT SOLN
15.0000 mL | Freq: Two times a day (BID) | OROMUCOSAL | Status: DC
  Administered 2016-09-11 – 2016-09-16 (×12): 15 mL via OROMUCOSAL
  Filled 2016-09-11 (×10): qty 15

## 2016-09-11 MED ORDER — ORAL CARE MOUTH RINSE
15.0000 mL | Freq: Two times a day (BID) | OROMUCOSAL | Status: DC
  Administered 2016-09-11 – 2016-09-17 (×11): 15 mL via OROMUCOSAL

## 2016-09-11 MED ORDER — ENSURE ENLIVE PO LIQD: 237.0000 mL | Freq: Two times a day (BID) | ORAL | Status: DC

## 2016-09-11 NOTE — Progress Notes (Signed)
Subjective: Patient was admitted yesterday due to fever and shortness of breath. His urinalysis is abnormal and lactic acid is elevated. He was hypoxic on admission.  Objective: Vital signs in last 24 hours: Temp:  [97.7 F (36.5 C)-103.1 F (39.5 C)] 98.6 F (37 C) (08/30 0400) Pulse Rate:  [89-114] 104 (08/30 0800) Resp:  [14-34] 23 (08/30 0800) BP: (84-137)/(40-78) 112/55 (08/30 0800) SpO2:  [87 %-97 %] 94 % (08/30 0800) Weight:  [83.8 kg (184 lb 11.9 oz)-84.8 kg (186 lb 15.2 oz)] 83.8 kg (184 lb 11.9 oz) (08/30 0500) Weight change:     Intake/Output from previous day: 08/29 0701 - 08/30 0700 In: 2912.5 [I.V.:1512.5; IV Piggyback:1400] Out: 950 [Urine:950]  PHYSICAL EXAM General appearance: delirious and no distress Resp: diminished breath sounds bilaterally and rhonchi bilaterally Cardio: S1, S2 normal GI: soft, non-tender; bowel sounds normal; no masses,  no organomegaly Extremities: extremities normal, atraumatic, no cyanosis or edema  Lab Results:  Results for orders placed or performed during the hospital encounter of 08/27/2016 (from the past 48 hour(s))  Comprehensive metabolic panel     Status: Abnormal   Collection Time: 08/19/2016 12:03 PM  Result Value Ref Range   Sodium 140 135 - 145 mmol/L   Potassium 4.4 3.5 - 5.1 mmol/L   Chloride 101 101 - 111 mmol/L   CO2 28 22 - 32 mmol/L   Glucose, Bld 110 (H) 65 - 99 mg/dL   BUN 29 (H) 6 - 20 mg/dL   Creatinine, Ser 1.75 (H) 0.61 - 1.24 mg/dL   Calcium 9.1 8.9 - 10.3 mg/dL   Total Protein 7.2 6.5 - 8.1 g/dL   Albumin 3.8 3.5 - 5.0 g/dL   AST 20 15 - 41 U/L   ALT 12 (L) 17 - 63 U/L   Alkaline Phosphatase 53 38 - 126 U/L   Total Bilirubin 0.5 0.3 - 1.2 mg/dL   GFR calc non Af Amer 35 (L) >60 mL/min   GFR calc Af Amer 40 (L) >60 mL/min    Comment: (NOTE) The eGFR has been calculated using the CKD EPI equation. This calculation has not been validated in all clinical situations. eGFR's persistently <60 mL/min  signify possible Chronic Kidney Disease.    Anion gap 11 5 - 15  CBC with Differential     Status: Abnormal   Collection Time: 08/23/2016 12:03 PM  Result Value Ref Range   WBC 12.7 (H) 4.0 - 10.5 K/uL   RBC 4.60 4.22 - 5.81 MIL/uL   Hemoglobin 14.3 13.0 - 17.0 g/dL   HCT 43.9 39.0 - 52.0 %   MCV 95.4 78.0 - 100.0 fL   MCH 31.1 26.0 - 34.0 pg   MCHC 32.6 30.0 - 36.0 g/dL   RDW 14.4 11.5 - 15.5 %   Platelets 230 150 - 400 K/uL   Neutrophils Relative % 84 %   Neutro Abs 10.7 (H) 1.7 - 7.7 K/uL   Lymphocytes Relative 7 %   Lymphs Abs 0.9 0.7 - 4.0 K/uL   Monocytes Relative 8 %   Monocytes Absolute 1.1 (H) 0.1 - 1.0 K/uL   Eosinophils Relative 0 %   Eosinophils Absolute 0.0 0.0 - 0.7 K/uL   Basophils Relative 1 %   Basophils Absolute 0.1 0.0 - 0.1 K/uL  Urinalysis, Routine w reflex microscopic     Status: Abnormal   Collection Time: 08/31/2016 12:03 PM  Result Value Ref Range   Color, Urine YELLOW YELLOW   APPearance HAZY (A) CLEAR  Specific Gravity, Urine 1.017 1.005 - 1.030   pH 5.0 5.0 - 8.0   Glucose, UA NEGATIVE NEGATIVE mg/dL   Hgb urine dipstick SMALL (A) NEGATIVE   Bilirubin Urine NEGATIVE NEGATIVE   Ketones, ur NEGATIVE NEGATIVE mg/dL   Protein, ur 100 (A) NEGATIVE mg/dL   Nitrite NEGATIVE NEGATIVE   Leukocytes, UA LARGE (A) NEGATIVE   RBC / HPF 6-30 0 - 5 RBC/hpf   WBC, UA TOO NUMEROUS TO COUNT 0 - 5 WBC/hpf   Bacteria, UA NONE SEEN NONE SEEN   Squamous Epithelial / LPF 0-5 (A) NONE SEEN   WBC Clumps PRESENT    Mucus PRESENT   Blood Culture (routine x 2)     Status: None (Preliminary result)   Collection Time: 08/15/2016 12:28 PM  Result Value Ref Range   Specimen Description BLOOD RIGHT ARM DRAWN BY RN    Special Requests      Blood Culture results may not be optimal due to an inadequate volume of blood received in culture bottles   Culture NO GROWTH < 24 HOURS    Report Status PENDING   I-Stat CG4 Lactic Acid, ED     Status: Abnormal   Collection Time:  09/04/2016 12:50 PM  Result Value Ref Range   Lactic Acid, Venous 3.04 (HH) 0.5 - 1.9 mmol/L  Blood Culture (routine x 2)     Status: None (Preliminary result)   Collection Time: 08/21/2016 12:51 PM  Result Value Ref Range   Specimen Description BLOOD LEFT ARM    Special Requests      BOTTLES DRAWN AEROBIC AND ANAEROBIC Blood Culture adequate volume   Culture NO GROWTH < 24 HOURS    Report Status PENDING   Lactic acid, plasma     Status: Abnormal   Collection Time: 08/29/2016  4:43 PM  Result Value Ref Range   Lactic Acid, Venous 2.1 (HH) 0.5 - 1.9 mmol/L    Comment: CRITICAL RESULT CALLED TO, READ BACK BY AND VERIFIED WITH: PHILLIPS,C ON 09/08/2016 AT 1745 BY LOY,C   Procalcitonin     Status: None   Collection Time: 09/06/2016  4:43 PM  Result Value Ref Range   Procalcitonin <0.10 ng/mL    Comment:        Interpretation: PCT (Procalcitonin) <= 0.5 ng/mL: Systemic infection (sepsis) is not likely. Local bacterial infection is possible. (NOTE)         ICU PCT Algorithm               Non ICU PCT Algorithm    ----------------------------     ------------------------------         PCT < 0.25 ng/mL                 PCT < 0.1 ng/mL     Stopping of antibiotics            Stopping of antibiotics       strongly encouraged.               strongly encouraged.    ----------------------------     ------------------------------       PCT level decrease by               PCT < 0.25 ng/mL       >= 80% from peak PCT       OR PCT 0.25 - 0.5 ng/mL          Stopping of antibiotics  encouraged.     Stopping of antibiotics           encouraged.    ----------------------------     ------------------------------       PCT level decrease by              PCT >= 0.25 ng/mL       < 80% from peak PCT        AND PCT >= 0.5 ng/mL            Continuin g antibiotics                                              encouraged.       Continuing antibiotics             encouraged.    ----------------------------     ------------------------------     PCT level increase compared          PCT > 0.5 ng/mL         with peak PCT AND          PCT >= 0.5 ng/mL             Escalation of antibiotics                                          strongly encouraged.      Escalation of antibiotics        strongly encouraged.   Protime-INR     Status: None   Collection Time: 08/16/2016  4:43 PM  Result Value Ref Range   Prothrombin Time 13.6 11.4 - 15.2 seconds   INR 1.05   APTT     Status: None   Collection Time: 08/18/2016  4:43 PM  Result Value Ref Range   aPTT 33 24 - 36 seconds  MRSA PCR Screening     Status: None   Collection Time: 08/17/2016  5:00 PM  Result Value Ref Range   MRSA by PCR NEGATIVE NEGATIVE    Comment:        The GeneXpert MRSA Assay (FDA approved for NASAL specimens only), is one component of a comprehensive MRSA colonization surveillance program. It is not intended to diagnose MRSA infection nor to guide or monitor treatment for MRSA infections.   Lactic acid, plasma     Status: Abnormal   Collection Time: 08/25/2016  7:19 PM  Result Value Ref Range   Lactic Acid, Venous 2.1 (HH) 0.5 - 1.9 mmol/L    Comment: CRITICAL RESULT CALLED TO, READ BACK BY AND VERIFIED WITH: HOWARD,T AT 2030 ON 8.29.2018 BY ISLEY,B   Glucose, capillary     Status: Abnormal   Collection Time: 08/27/2016  7:51 PM  Result Value Ref Range   Glucose-Capillary 121 (H) 65 - 99 mg/dL  Glucose, capillary     Status: Abnormal   Collection Time: 08/16/2016 11:43 PM  Result Value Ref Range   Glucose-Capillary 150 (H) 65 - 99 mg/dL  Glucose, capillary     Status: Abnormal   Collection Time: 09/11/16  4:00 AM  Result Value Ref Range   Glucose-Capillary 151 (H) 65 - 99 mg/dL  Comprehensive metabolic panel     Status: Abnormal   Collection Time: 09/11/16  5:32 AM  Result Value Ref Range   Sodium 142 135 - 145 mmol/L   Potassium 3.6 3.5 - 5.1 mmol/L    Comment: DELTA  CHECK NOTED   Chloride 106 101 - 111 mmol/L   CO2 25 22 - 32 mmol/L   Glucose, Bld 169 (H) 65 - 99 mg/dL   BUN 33 (H) 6 - 20 mg/dL   Creatinine, Ser 1.81 (H) 0.61 - 1.24 mg/dL   Calcium 8.3 (L) 8.9 - 10.3 mg/dL   Total Protein 6.1 (L) 6.5 - 8.1 g/dL   Albumin 3.1 (L) 3.5 - 5.0 g/dL   AST 35 15 - 41 U/L   ALT 11 (L) 17 - 63 U/L   Alkaline Phosphatase 33 (L) 38 - 126 U/L   Total Bilirubin 0.8 0.3 - 1.2 mg/dL   GFR calc non Af Amer 33 (L) >60 mL/min   GFR calc Af Amer 39 (L) >60 mL/min    Comment: (NOTE) The eGFR has been calculated using the CKD EPI equation. This calculation has not been validated in all clinical situations. eGFR's persistently <60 mL/min signify possible Chronic Kidney Disease.    Anion gap 11 5 - 15  CBC     Status: None   Collection Time: 09/11/16  5:32 AM  Result Value Ref Range   WBC 7.5 4.0 - 10.5 K/uL   RBC 4.30 4.22 - 5.81 MIL/uL   Hemoglobin 13.6 13.0 - 17.0 g/dL   HCT 41.3 39.0 - 52.0 %   MCV 96.0 78.0 - 100.0 fL   MCH 31.6 26.0 - 34.0 pg   MCHC 32.9 30.0 - 36.0 g/dL   RDW 14.5 11.5 - 15.5 %   Platelets 180 150 - 400 K/uL    ABGS No results for input(s): PHART, PO2ART, TCO2, HCO3 in the last 72 hours.  Invalid input(s): PCO2 CULTURES Recent Results (from the past 240 hour(s))  Blood Culture (routine x 2)     Status: None (Preliminary result)   Collection Time: 08/31/2016 12:28 PM  Result Value Ref Range Status   Specimen Description BLOOD RIGHT ARM DRAWN BY RN  Final   Special Requests   Final    Blood Culture results may not be optimal due to an inadequate volume of blood received in culture bottles   Culture NO GROWTH < 24 HOURS  Final   Report Status PENDING  Incomplete  Blood Culture (routine x 2)     Status: None (Preliminary result)   Collection Time: 09/05/2016 12:51 PM  Result Value Ref Range Status   Specimen Description BLOOD LEFT ARM  Final   Special Requests   Final    BOTTLES DRAWN AEROBIC AND ANAEROBIC Blood Culture adequate  volume   Culture NO GROWTH < 24 HOURS  Final   Report Status PENDING  Incomplete  MRSA PCR Screening     Status: None   Collection Time: 08/29/2016  5:00 PM  Result Value Ref Range Status   MRSA by PCR NEGATIVE NEGATIVE Final    Comment:        The GeneXpert MRSA Assay (FDA approved for NASAL specimens only), is one component of a comprehensive MRSA colonization surveillance program. It is not intended to diagnose MRSA infection nor to guide or monitor treatment for MRSA infections.    Studies/Results: Dg Chest 1 View  Result Date: 08/14/2016 CLINICAL DATA:  High fever and confusion.  Cough and weakness. EXAM: CHEST 1 VIEW COMPARISON:  06/24/2016. FINDINGS: Trachea is midline, given patient rotation. Heart size stable.  Thoracic aorta is calcified. Coronary stent is noted. Probable mild scarring in the right midlung zone. Lungs are otherwise clear. No pleural fluid. IMPRESSION: 1. No acute findings. 2.  Aortic atherosclerosis (ICD10-170.0). Electronically Signed   By: Lorin Picket M.D.   On: 09/09/2016 12:54   Dg Chest Port 1 View  Result Date: 09/04/2016 CLINICAL DATA:  Hypoxia.  Abdominal distention. EXAM: PORTABLE CHEST 1 VIEW COMPARISON:  Radiographs earlier today and 06/24/2016. FINDINGS: 1633 hour. The patient's mandible overlies the upper chest. There is mild patient rotation to the right. The heart size and mediastinal contours are stable. There is aortic atherosclerosis. There is increased interstitial prominence in both lungs, likely reflecting edema. No confluent airspace opacity, pneumothorax or significant pleural effusion. IMPRESSION: Interval increased interstitial prominence in both lungs, most likely due to edema. Fever and confusion for reported on earlier study, and these findings could be due to early aspiration. Electronically Signed   By: Richardean Sale M.D.   On: 09/02/2016 17:09   Dg Abd Portable 1v  Result Date: 09/03/2016 CLINICAL DATA:  Hypoxia and abdominal  distention. EXAM: PORTABLE ABDOMEN - 1 VIEW COMPARISON:  Abdominal CT 05/26/2014. FINDINGS: 1638 hours. Two supine views are submitted. There is gas throughout the small and large bowel. No significant bowel distension, bowel wall thickening or supine evidence of free intraperitoneal air demonstrated. There is aortoiliac atherosclerosis. No acute osseous findings are seen. IMPRESSION: No radiographic evidence of acute abdominal process. Aortic atherosclerosis. Electronically Signed   By: Richardean Sale M.D.   On: 08/29/2016 17:11    Medications: I have reviewed the patient's current medications.  Assesment:  Principal Problem:   Sepsis ([place]) Active Problems:   Chronic kidney disease, stage 3   Acute encephalopathy   Lower urinary tract infectious disease   COPD (chronic obstructive pulmonary disease) (HCC)   Acute respiratory failure with hypoxia (HCC)   Diabetes mellitus with nephropathy ([place])    Plan:  Medications reviewed Continue IV antibiotics and IV steroid Will advance diet Will do pulmonary consult Will do CBC/BMP    LOS: 1 day   Shaniece Bussa 09/11/2016, 8:33 AM

## 2016-09-11 NOTE — Progress Notes (Signed)
Patient is currently confused, trying to climb out of the bed frequently. Ativan given to help reduce his anxiety. Informed the MD that the patient's daughter called overnight (states she is the POA) and said that the family decided to make him a DNR. The MD stated that when the family brings some official paperwork in that he would speak to them about it further. Will continue to monitor.

## 2016-09-11 NOTE — Progress Notes (Signed)
Initial Nutrition Assessment  DOCUMENTATION CODES:  Not applicable  INTERVENTION:  Ensure Enlive po BID, each supplement provides 350 kcal and 20 grams of protein  RD to monitor PO intake and call follow up as warranted.   NUTRITION DIAGNOSIS:  Increased nutrient needs related to chronic illness (COPD), acute illness (uti/sepsis) as evidenced by the nutritional recommendations in acute illness  GOAL:  Patient will meet greater than or equal to 90% of their needs  MONITOR:  PO intake, Supplement acceptance, Diet advancement, Labs, I & O's  REASON FOR ASSESSMENT:  Consult COPD Protocol  ASSESSMENT:  83 y/o male PMHx COPD, Dementia, EOTH/tobacco abuse, DM, PVD, MI, HTN, CKD, HLD. Resides at ALF. Presented with fever. Worked up for UTI, Sepsis and admitted to ICU for management. Also with acute respiratory failure, xray suspicious of aspiration event. Had recent OP doctor visit for dysphagia.  Pt responds appropriately to questions, though unsure of validity of answers.   He says he was eating well prior to admission. When asked about his trouble swallowing, he says he doesn't have much trouble, but this just gets "aggravted" sometimes. He says he ate well at lunch (no documentation of this). He is agreeable to supplements.   Their is no documentation of patient presenting with poor PO intake, just fever and confusion. In fact, their is documentation that patient has been normal other than having 1 episode of diarrhea and a fever. Per chart, their is minimal recent wt history. Had hospital admission 2 years ago and was admitted at 174 lbs. In interim, His 2 documented weight of 210 highly appear to be estimated as opposed to done on scale. Do not believe pt has been losing weight.   At this time, patient has no documented intake since admission, though diet was only advanced late this morning. Will order supplements and monitor intake levels  NFPE: Potentially mild muscle/fat wasting of  temporalis/orbital, though this just may be his body structure. No lower extremity edema.  Labs:Albumin 3.1, BUN/Creat:33/1.81, BG: 120-170 Meds: Methylprednisolone, PPI, IV Abx, IVF   Recent Labs Lab 08/24/2016 1203 09/11/16 0532  NA 140 142  K 4.4 3.6  CL 101 106  CO2 28 25  BUN 29* 33*  CREATININE 1.75* 1.81*  CALCIUM 9.1 8.3*  GLUCOSE 110* 169*   Diet Order:  Diet 2 gram sodium Room service appropriate? Yes; Fluid consistency: Thin  Skin:  Reviewed, no issues  Last BM:  Unknown  Height:  Ht Readings from Last 1 Encounters:  08/25/2016 5\' 9"  (1.753 m)   Weight:  Wt Readings from Last 1 Encounters:  09/11/16 184 lb 11.9 oz (83.8 kg)   Wt Readings from Last 10 Encounters:  09/11/16 184 lb 11.9 oz (83.8 kg)  09/05/16 182 lb 3.2 oz (82.6 kg)  04/19/16 210 lb (95.3 kg)  06/10/15 210 lb (95.3 kg)  08/04/14 186 lb 6.4 oz (84.6 kg)  06/25/13 222 lb (100.7 kg)  06/11/13 224 lb (101.6 kg)  02/13/13 216 lb 14.4 oz (98.4 kg)  09/08/12 208 lb (94.3 kg)  07/06/12 223 lb 6.4 oz (101.3 kg)   Ideal Body Weight:  72.73 kg  BMI:  Body mass index is 27.28 kg/m.  Estimated Nutritional Needs:  Kcal:  1850-2000 kcals (22-24 kca/kg bw) Protein:  87-102g Pro (1.2-1.4 g/kg ibw) Fluid:  1.8-2 L fluid (1 ml/kcal)  EDUCATION NEEDS:  No education needs identified at this time  Burtis Junes RD, LDN, [facility] Pager: [phone] 09/11/2016 1:49 PM

## 2016-09-11 NOTE — Consult Note (Signed)
Consult requested by: Dr. Legrand Rams Consult requested for: Respiratory failure  HPI: This is an 83 year old who came to the emergency department from an assisted living facility because of fever. When he was seen in the emergency department he was found to have significant fever appeared to have urinary tract infection was hypoxic and septic. At baseline he has dementia and really can't provide me any history. He does tell me his name. He looks quite short of breath. Past medical history is listed below. He can't provide any history. History is from the medical record.  Past Medical History:  Diagnosis Date  . Alcohol abuse, in remission   . Anemia   . ASCVD (arteriosclerotic cardiovascular disease)     Inferior myocardial infarction in 08/1998 requiring PCI of the RCA; moderate residual disease in the left anterior descending and first diagonal; ejection fraction of 45%  . Cerebrovascular disease     Right carotid bruit; plaque without stenosis in 2003 and 2005  . COPD (chronic obstructive pulmonary disease) ([place])   . Dementia   . Diabetes mellitus    A1c-7.7 in 2004 with diet-controlled; 6.3 and 11/08 on oral medication  . Elevated PSA    prior negative prostate biopsy  . Hyperlipidemia   . Hypertension   . Hypothyroid   . MI, old   . Peripheral vascular disease ([place])   . Tobacco abuse    Consumption tapered to 2 packs per week  . Urinary retention    07/2014     Family History  Problem Relation Age of Onset  . Hypertension Mother   . Heart disease Father   . Hyperlipidemia Father   . Hypertension Father   . Diabetes Father      Social History   Social History  . Marital status: Widowed    Spouse name: N/A  . Number of children: N/A  . Years of education: N/A   Occupational History  . retired Retired   Social History Main Topics  . Smoking status: Former Smoker    Packs/day: 1.00    Years: 60.00    Types: Cigarettes    Quit date: 04/21/2011  . Smokeless tobacco:  Never Used  . Alcohol use No     Comment: Former Abuse  . Drug use: No  . Sexual activity: Not Asked   Other Topics Concern  . None   Social History Narrative  . None     ROS: Unobtainable    Objective: Vital signs in last 24 hours: Temp:  [97.7 F (36.5 C)-103.1 F (39.5 C)] 98.6 F (37 C) (08/30 0400) Pulse Rate:  [89-114] 104 (08/30 0800) Resp:  [14-34] 23 (08/30 0800) BP: (84-137)/(40-78) 112/55 (08/30 0800) SpO2:  [87 %-97 %] 97 % (08/30 0849) Weight:  [83.8 kg (184 lb 11.9 oz)-84.8 kg (186 lb 15.2 oz)] 83.8 kg (184 lb 11.9 oz) (08/30 0500) Weight change:     Intake/Output from previous day: 08/29 0701 - 08/30 0700 In: 2912.5 [I.V.:1512.5; IV Piggyback:1400] Out: 950 [Urine:950]  PHYSICAL EXAM Constitutional: He is awake and responsive but confused. Eyes: Pupils react. EOMI. Ears nose mouth and throat: His throat is clear. Hearing is grossly normal. Cardiovascular: His heart is regular with tachycardia. Respiratory: His respiratory effort is increased. He has rhonchi and rales bilaterally. Gastrointestinal: His abdomen is soft obese with no masses. Skin: Mild chronic venous stasis changes in the lower extremities. Musculoskeletal: He is moving all 4 extremities and strength seems normal. Neurological: He is confused. Psychiatric: He's been very  agitated.  Lab Results: Basic Metabolic Panel:  Recent Labs  08/24/2016 1203 09/11/16 0532  NA 140 142  K 4.4 3.6  CL 101 106  CO2 28 25  GLUCOSE 110* 169*  BUN 29* 33*  CREATININE 1.75* 1.81*  CALCIUM 9.1 8.3*   Liver Function Tests:  Recent Labs  08/24/2016 1203 09/11/16 0532  AST 20 35  ALT 12* 11*  ALKPHOS 53 33*  BILITOT 0.5 0.8  PROT 7.2 6.1*  ALBUMIN 3.8 3.1*   No results for input(s): LIPASE, AMYLASE in the last 72 hours. No results for input(s): AMMONIA in the last 72 hours. CBC:  Recent Labs  08/27/2016 1203 09/11/16 0532  WBC 12.7* 7.5  NEUTROABS 10.7*  --   HGB 14.3 13.6  HCT 43.9 41.3   MCV 95.4 96.0  PLT 230 180   Cardiac Enzymes: No results for input(s): CKTOTAL, CKMB, CKMBINDEX, TROPONINI in the last 72 hours. BNP: No results for input(s): PROBNP in the last 72 hours. D-Dimer: No results for input(s): DDIMER in the last 72 hours. CBG:  Recent Labs  08/20/2016 1951 09/02/2016 2343 09/11/16 0400  GLUCAP 121* 150* 151*   Hemoglobin A1C: No results for input(s): HGBA1C in the last 72 hours. Fasting Lipid Panel: No results for input(s): CHOL, HDL, LDLCALC, TRIG, CHOLHDL, LDLDIRECT in the last 72 hours. Thyroid Function Tests: No results for input(s): TSH, T4TOTAL, FREET4, T3FREE, THYROIDAB in the last 72 hours. Anemia Panel: No results for input(s): VITAMINB12, FOLATE, FERRITIN, TIBC, IRON, RETICCTPCT in the last 72 hours. Coagulation:  Recent Labs  09/05/2016 1643  LABPROT 13.6  INR 1.05   Urine Drug Screen: Drugs of Abuse     Component Value Date/Time   LABOPIA NONE DETECTED 08/02/2014 0123   COCAINSCRNUR NONE DETECTED 08/02/2014 0123   LABBENZ POSITIVE (A) 08/02/2014 0123   AMPHETMU NONE DETECTED 08/02/2014 0123   THCU NONE DETECTED 08/02/2014 0123   LABBARB NONE DETECTED 08/02/2014 0123    Alcohol Level: No results for input(s): ETH in the last 72 hours. Urinalysis:  Recent Labs  08/31/2016 1203  COLORURINE YELLOW  LABSPEC 1.017  PHURINE 5.0  GLUCOSEU NEGATIVE  HGBUR SMALL*  BILIRUBINUR NEGATIVE  KETONESUR NEGATIVE  PROTEINUR 100*  NITRITE NEGATIVE  LEUKOCYTESUR LARGE*   Misc. Labs:   ABGS: No results for input(s): PHART, PO2ART, TCO2, HCO3 in the last 72 hours.  Invalid input(s): PCO2   MICROBIOLOGY: Recent Results (from the past 240 hour(s))  Blood Culture (routine x 2)     Status: None (Preliminary result)   Collection Time: 09/05/2016 12:28 PM  Result Value Ref Range Status   Specimen Description BLOOD RIGHT ARM DRAWN BY RN  Final   Special Requests   Final    Blood Culture results may not be optimal due to an inadequate  volume of blood received in culture bottles   Culture NO GROWTH < 24 HOURS  Final   Report Status PENDING  Incomplete  Blood Culture (routine x 2)     Status: None (Preliminary result)   Collection Time: 08/18/2016 12:51 PM  Result Value Ref Range Status   Specimen Description BLOOD LEFT ARM  Final   Special Requests   Final    BOTTLES DRAWN AEROBIC AND ANAEROBIC Blood Culture adequate volume   Culture NO GROWTH < 24 HOURS  Final   Report Status PENDING  Incomplete  MRSA PCR Screening     Status: None   Collection Time: 09/02/2016  5:00 PM  Result Value Ref Range  Status   MRSA by PCR NEGATIVE NEGATIVE Final    Comment:        The GeneXpert MRSA Assay (FDA approved for NASAL specimens only), is one component of a comprehensive MRSA colonization surveillance program. It is not intended to diagnose MRSA infection nor to guide or monitor treatment for MRSA infections.     Studies/Results: Dg Chest 1 View  Result Date: 08/19/2016 CLINICAL DATA:  High fever and confusion.  Cough and weakness. EXAM: CHEST 1 VIEW COMPARISON:  06/24/2016. FINDINGS: Trachea is midline, given patient rotation. Heart size stable. Thoracic aorta is calcified. Coronary stent is noted. Probable mild scarring in the right midlung zone. Lungs are otherwise clear. No pleural fluid. IMPRESSION: 1. No acute findings. 2.  Aortic atherosclerosis (ICD10-170.0). Electronically Signed   By: Lorin Picket M.D.   On: 08/21/2016 12:54   Dg Chest Port 1 View  Result Date: 09/09/2016 CLINICAL DATA:  Hypoxia.  Abdominal distention. EXAM: PORTABLE CHEST 1 VIEW COMPARISON:  Radiographs earlier today and 06/24/2016. FINDINGS: 1633 hour. The patient's mandible overlies the upper chest. There is mild patient rotation to the right. The heart size and mediastinal contours are stable. There is aortic atherosclerosis. There is increased interstitial prominence in both lungs, likely reflecting edema. No confluent airspace opacity,  pneumothorax or significant pleural effusion. IMPRESSION: Interval increased interstitial prominence in both lungs, most likely due to edema. Fever and confusion for reported on earlier study, and these findings could be due to early aspiration. Electronically Signed   By: Richardean Sale M.D.   On: 08/20/2016 17:09   Dg Abd Portable 1v  Result Date: 08/22/2016 CLINICAL DATA:  Hypoxia and abdominal distention. EXAM: PORTABLE ABDOMEN - 1 VIEW COMPARISON:  Abdominal CT 05/26/2014. FINDINGS: 1638 hours. Two supine views are submitted. There is gas throughout the small and large bowel. No significant bowel distension, bowel wall thickening or supine evidence of free intraperitoneal air demonstrated. There is aortoiliac atherosclerosis. No acute osseous findings are seen. IMPRESSION: No radiographic evidence of acute abdominal process. Aortic atherosclerosis. Electronically Signed   By: Richardean Sale M.D.   On: 09/12/2016 17:11    Medications:  Prior to Admission:  Prescriptions Prior to Admission  Medication Sig Dispense Refill Last Dose  . ALPRAZolam (XANAX) 1 MG tablet Take one tablet by mouth in the morning and two tablets at bedtime for anxiety/rest (Patient taking differently: Take 1-2 mg by mouth 2 (two) times daily. Take one tablet by mouth in the morning and two tablets at bedtime for anxiety/rest) 90 tablet 5 08/23/2016 at Unknown time  . aspirin 81 MG EC tablet Take 81 mg by mouth daily.     08/24/2016 at Unknown time  . calcium carbonate (ANTACID CALCIUM) 500 MG chewable tablet Chew 2 tablets by mouth 3 (three) times daily with meals.   08/14/2016 at Unknown time  . cloNIDine (CATAPRES) 0.1 MG tablet Take 0.1 mg by mouth 2 (two) times daily.    08/29/2016 at Unknown time  . famotidine (PEPCID) 20 MG tablet Take 20 mg by mouth daily.   08/25/2016 at Unknown time  . ferrous sulfate 325 (65 FE) MG tablet Take 1 tablet (325 mg total) by mouth 2 (two) times daily with a meal.  3 09/09/2016 at Unknown  time  . glipiZIDE (GLUCOTROL) 5 MG tablet Take 5 mg by mouth daily.    08/20/2016 at Unknown time  . levothyroxine (SYNTHROID, LEVOTHROID) 112 MCG tablet Take 112 mcg by mouth daily before breakfast.   09/09/2016  at Unknown time  . losartan (COZAAR) 50 MG tablet Take 50 mg by mouth daily.   10 08/21/2016 at Unknown time  . Melatonin 3 MG CAPS Take 6 mg by mouth at bedtime.   09/09/2016 at Unknown time  . memantine (NAMENDA) 10 MG tablet Take 10 mg by mouth 2 (two) times daily.  5 08/18/2016 at Unknown time  . metFORMIN (GLUCOPHAGE) 500 MG tablet Take 500 mg by mouth daily.   08/30/2016 at Unknown time  . omeprazole (PRILOSEC) 40 MG capsule Take 40 mg by mouth daily.   08/30/2016 at Unknown time  . tamsulosin (FLOMAX) 0.4 MG CAPS capsule Take 1 capsule (0.4 mg total) by mouth at bedtime. 30 capsule 0 09/09/2016 at Unknown time  . albuterol (PROVENTIL HFA;VENTOLIN HFA) 108 (90 Base) MCG/ACT inhaler Inhale 1-2 puffs into the lungs every 6 (six) hours as needed for wheezing or shortness of breath. 1 Inhaler 0 unknown  . dextromethorphan-guaiFENesin (TUSSIN DM) 10-100 MG/5ML liquid Take 10 mLs by mouth every 4 (four) hours as needed for cough.   unknown  . nitroGLYCERIN (NITROSTAT) 0.4 MG SL tablet Place 0.4 mg under the tongue every 5 (five) minutes as needed for chest pain.    unknown  . senna (SENOKOT) 8.6 MG TABS tablet Take 1 tablet by mouth daily as needed for mild constipation.   unknown   Scheduled: . acetylcysteine  3 mL Nebulization Q4H  . chlorhexidine  15 mL Mouth Rinse BID  . enoxaparin (LOVENOX) injection  40 mg Subcutaneous Q24H  . ipratropium-albuterol  3 mL Nebulization Q4H  . levothyroxine  50 mcg Intravenous Daily  . mouth rinse  15 mL Mouth Rinse q12n4p  . methylPREDNISolone (SOLU-MEDROL) injection  40 mg Intravenous Q12H  . pantoprazole (PROTONIX) IV  40 mg Intravenous Q24H  . sodium chloride flush  3 mL Intravenous Q12H   Continuous: . sodium chloride 125 mL/hr at 08/22/2016 1754  .  piperacillin-tazobactam (ZOSYN)  IV 3.375 g (09/11/16 0547)   DXI:[id number] **OR** acetaminophen, albuterol, LORazepam, ondansetron **OR** ondansetron (ZOFRAN) IV  Assesment: He was admitted with COPD exacerbation and acute hypoxic respiratory failure. He is septic. He is very confused. He has probably aspirated based on chest x-ray that I have personally reviewed. He is about 2 L positive with intake and output since admission so he could have some element of volume overload but he sounds more like he's aspirated. He is on Zosyn which should cover aspiration. He is still tachycardic to And hypotensive. He had temperature to 103.1. He has acute on chronic renal failure and his renal function is worse than yesterday. He has significant confusion and has been agitated. He is critically ill with significant chance of requiring intubation and mechanical ventilation. He is on steroids which I think is appropriate. He has not required pressors yet. Principal Problem:   Sepsis ([place]) Active Problems:   Chronic kidney disease, stage 3   Acute encephalopathy   Lower urinary tract infectious disease   COPD (chronic obstructive pulmonary disease) ([facility])   Acute respiratory failure with hypoxia ([facility])   Diabetes mellitus with nephropathy ([place])    Plan: Continue Zosyn and vancomycin for now. Continue IV steroids IV fluids. Repeat chest x-ray tomorrow    LOS: 1 day   Andee Chivers L 09/11/2016, 8:55 AM

## 2016-09-11 NOTE — Plan of Care (Signed)
Problem: Tissue Perfusion: Goal: Risk factors for ineffective tissue perfusion will decrease Outcome: Progressing Pt receiving lovenox for DVT prevention

## 2016-09-12 ENCOUNTER — Inpatient Hospital Stay (HOSPITAL_COMMUNITY): Payer: Medicare Other

## 2016-09-12 ENCOUNTER — Ambulatory Visit (HOSPITAL_COMMUNITY): Admission: RE | Admit: 2016-09-12 | Payer: Medicare Other | Source: Ambulatory Visit

## 2016-09-12 LAB — CBC
HCT: 45.5 % (ref 39.0–52.0)
Hemoglobin: 14.6 g/dL (ref 13.0–17.0)
MCH: 30.9 pg (ref 26.0–34.0)
MCHC: 32.1 g/dL (ref 30.0–36.0)
MCV: 96.4 fL (ref 78.0–100.0)
Platelets: 224 10*3/uL (ref 150–400)
RBC: 4.72 MIL/uL (ref 4.22–5.81)
RDW: 14.8 % (ref 11.5–15.5)
WBC: 11.8 10*3/uL — ABNORMAL HIGH (ref 4.0–10.5)

## 2016-09-12 LAB — BASIC METABOLIC PANEL
Anion gap: 9 (ref 5–15)
BUN: 36 mg/dL — AB (ref 6–20)
CALCIUM: 8.4 mg/dL — AB (ref 8.9–10.3)
CHLORIDE: 112 mmol/L — AB (ref 101–111)
CO2: 23 mmol/L (ref 22–32)
CREATININE: 1.73 mg/dL — AB (ref 0.61–1.24)
GFR calc non Af Amer: 35 mL/min — ABNORMAL LOW (ref >60)
GFR, EST AFRICAN AMERICAN: 41 mL/min — AB (ref >60)
GLUCOSE: 121 mg/dL — AB (ref 65–99)
Potassium: 4.2 mmol/L (ref 3.5–5.1)
Sodium: 144 mmol/L (ref 135–145)

## 2016-09-12 LAB — URINE CULTURE
Culture: NO GROWTH
SPECIAL REQUESTS: NORMAL

## 2016-09-12 MED ORDER — LORAZEPAM 2 MG/ML IJ SOLN
0.5000 mg | INTRAMUSCULAR | Status: DC | PRN
  Administered 2016-09-12 – 2016-09-16 (×14): 0.5 mg via INTRAVENOUS
  Filled 2016-09-12 (×13): qty 1

## 2016-09-12 MED ORDER — FUROSEMIDE 10 MG/ML IJ SOLN
40.0000 mg | Freq: Once | INTRAMUSCULAR | Status: AC
  Administered 2016-09-13: 40 mg via INTRAVENOUS
  Filled 2016-09-12: qty 4

## 2016-09-12 MED ORDER — FUROSEMIDE 10 MG/ML IJ SOLN
40.0000 mg | Freq: Once | INTRAMUSCULAR | Status: AC
  Administered 2016-09-12: 40 mg via INTRAVENOUS
  Filled 2016-09-12: qty 4

## 2016-09-12 NOTE — Progress Notes (Signed)
Patient becoming more short of breath and oxygen sats are dropping. Restlessness increasing and pt is more agitated. Notice that pt is 8liters positive on I/Os. Call MD on call order for lasix IV push 40mg  x2 and foley insertion. Also, increased frequency of Ativan PRN to q2hrs. Foley inserted without difficulty. Label on bag. Secured by [employer]. Cloudy yellow urine returned.

## 2016-09-12 NOTE — Progress Notes (Signed)
Subjective: He looks a little better than yesterday. He had a number of studies done including ultrasound of his legs which did not show clots and x-ray of his esophagus which shows that his esophagus is patent with no obstruction but did show more definitive right lower lobe infiltrate. He's coughing some.  Objective: Vital signs in last 24 hours: Temp:  [97.8 F (36.6 C)-99.4 F (37.4 C)] 97.8 F (36.6 C) (08/31 0051) Pulse Rate:  [101-136] 121 (08/31 0100) Resp:  [12-35] 31 (08/31 0100) BP: (104-145)/(44-105) 115/61 (08/31 0100) SpO2:  [80 %-97 %] 91 % (08/31 0328) Weight:  [83.6 kg (184 lb 4.9 oz)] 83.6 kg (184 lb 4.9 oz) (08/31 0328) Weight change: -1.2 kg (-2 lb 10.3 oz)    Intake/Output from previous day: 08/30 0701 - 08/31 0700 In: 2600 [I.V.:2500; IV Piggyback:100] Out: 200 [Urine:200]  PHYSICAL EXAM General appearance: Sleepy but arousable and confused Resp: rales bilaterally and rhonchi bilaterally Cardio: Somewhat irregular GI: soft, non-tender; bowel sounds normal; no masses,  no organomegaly Extremities: No significant edema Confused not as agitated as yesterday  Lab Results:  Results for orders placed or performed during the hospital encounter of 09/01/2016 (from the past 48 hour(s))  Comprehensive metabolic panel     Status: Abnormal   Collection Time: 08/30/2016 12:03 PM  Result Value Ref Range   Sodium 140 135 - 145 mmol/L   Potassium 4.4 3.5 - 5.1 mmol/L   Chloride 101 101 - 111 mmol/L   CO2 28 22 - 32 mmol/L   Glucose, Bld 110 (H) 65 - 99 mg/dL   BUN 29 (H) 6 - 20 mg/dL   Creatinine, Ser 1.75 (H) 0.61 - 1.24 mg/dL   Calcium 9.1 8.9 - 10.3 mg/dL   Total Protein 7.2 6.5 - 8.1 g/dL   Albumin 3.8 3.5 - 5.0 g/dL   AST 20 15 - 41 U/L   ALT 12 (L) 17 - 63 U/L   Alkaline Phosphatase 53 38 - 126 U/L   Total Bilirubin 0.5 0.3 - 1.2 mg/dL   GFR calc non Af Amer 35 (L) >60 mL/min   GFR calc Af Amer 40 (L) >60 mL/min    Comment: (NOTE) The eGFR has been  calculated using the CKD EPI equation. This calculation has not been validated in all clinical situations. eGFR's persistently <60 mL/min signify possible Chronic Kidney Disease.    Anion gap 11 5 - 15  CBC with Differential     Status: Abnormal   Collection Time: 08/22/2016 12:03 PM  Result Value Ref Range   WBC 12.7 (H) 4.0 - 10.5 K/uL   RBC 4.60 4.22 - 5.81 MIL/uL   Hemoglobin 14.3 13.0 - 17.0 g/dL   HCT 43.9 39.0 - 52.0 %   MCV 95.4 78.0 - 100.0 fL   MCH 31.1 26.0 - 34.0 pg   MCHC 32.6 30.0 - 36.0 g/dL   RDW 14.4 11.5 - 15.5 %   Platelets 230 150 - 400 K/uL   Neutrophils Relative % 84 %   Neutro Abs 10.7 (H) 1.7 - 7.7 K/uL   Lymphocytes Relative 7 %   Lymphs Abs 0.9 0.7 - 4.0 K/uL   Monocytes Relative 8 %   Monocytes Absolute 1.1 (H) 0.1 - 1.0 K/uL   Eosinophils Relative 0 %   Eosinophils Absolute 0.0 0.0 - 0.7 K/uL   Basophils Relative 1 %   Basophils Absolute 0.1 0.0 - 0.1 K/uL  Urinalysis, Routine w reflex microscopic     Status: Abnormal  Collection Time: 09/12/2016 12:03 PM  Result Value Ref Range   Color, Urine YELLOW YELLOW   APPearance HAZY (A) CLEAR   Specific Gravity, Urine 1.017 1.005 - 1.030   pH 5.0 5.0 - 8.0   Glucose, UA NEGATIVE NEGATIVE mg/dL   Hgb urine dipstick SMALL (A) NEGATIVE   Bilirubin Urine NEGATIVE NEGATIVE   Ketones, ur NEGATIVE NEGATIVE mg/dL   Protein, ur 100 (A) NEGATIVE mg/dL   Nitrite NEGATIVE NEGATIVE   Leukocytes, UA LARGE (A) NEGATIVE   RBC / HPF 6-30 0 - 5 RBC/hpf   WBC, UA TOO NUMEROUS TO COUNT 0 - 5 WBC/hpf   Bacteria, UA NONE SEEN NONE SEEN   Squamous Epithelial / LPF 0-5 (A) NONE SEEN   WBC Clumps PRESENT    Mucus PRESENT   Blood Culture (routine x 2)     Status: None (Preliminary result)   Collection Time: 08/18/2016 12:28 PM  Result Value Ref Range   Specimen Description BLOOD RIGHT ARM DRAWN BY RN    Special Requests      Blood Culture results may not be optimal due to an inadequate volume of blood received in culture  bottles   Culture NO GROWTH 2 DAYS    Report Status PENDING   I-Stat CG4 Lactic Acid, ED     Status: Abnormal   Collection Time: 08/26/2016 12:50 PM  Result Value Ref Range   Lactic Acid, Venous 3.04 (HH) 0.5 - 1.9 mmol/L  Blood Culture (routine x 2)     Status: None (Preliminary result)   Collection Time: 08/27/2016 12:51 PM  Result Value Ref Range   Specimen Description BLOOD LEFT ARM    Special Requests      BOTTLES DRAWN AEROBIC AND ANAEROBIC Blood Culture adequate volume   Culture NO GROWTH 2 DAYS    Report Status PENDING   Strep pneumoniae urinary antigen     Status: None   Collection Time: 09/01/2016  4:28 PM  Result Value Ref Range   Strep Pneumo Urinary Antigen NEGATIVE NEGATIVE    Comment:        Infection due to S. pneumoniae cannot be absolutely ruled out since the antigen present may be below the detection limit of the test. Performed at [facility], [address], [place], [place] [address]   Lactic acid, plasma     Status: Abnormal   Collection Time: 08/23/2016  4:43 PM  Result Value Ref Range   Lactic Acid, Venous 2.1 (HH) 0.5 - 1.9 mmol/L    Comment: CRITICAL RESULT CALLED TO, READ BACK BY AND VERIFIED WITH: PHILLIPS,C ON 09/06/2016 AT 1745 BY LOY,C   Procalcitonin     Status: None   Collection Time: 08/27/2016  4:43 PM  Result Value Ref Range   Procalcitonin <0.10 ng/mL    Comment:        Interpretation: PCT (Procalcitonin) <= 0.5 ng/mL: Systemic infection (sepsis) is not likely. Local bacterial infection is possible. (NOTE)         ICU PCT Algorithm               Non ICU PCT Algorithm    ----------------------------     ------------------------------         PCT < 0.25 ng/mL                 PCT < 0.1 ng/mL     Stopping of antibiotics            Stopping of antibiotics  strongly encouraged.               strongly encouraged.    ----------------------------     ------------------------------       PCT level decrease by               PCT < 0.25  ng/mL       >= 80% from peak PCT       OR PCT 0.25 - 0.5 ng/mL          Stopping of antibiotics                                             encouraged.     Stopping of antibiotics           encouraged.    ----------------------------     ------------------------------       PCT level decrease by              PCT >= 0.25 ng/mL       < 80% from peak PCT        AND PCT >= 0.5 ng/mL            Continuin g antibiotics                                              encouraged.       Continuing antibiotics            encouraged.    ----------------------------     ------------------------------     PCT level increase compared          PCT > 0.5 ng/mL         with peak PCT AND          PCT >= 0.5 ng/mL             Escalation of antibiotics                                          strongly encouraged.      Escalation of antibiotics        strongly encouraged.   Protime-INR     Status: None   Collection Time: 08/15/2016  4:43 PM  Result Value Ref Range   Prothrombin Time 13.6 11.4 - 15.2 seconds   INR 1.05   APTT     Status: None   Collection Time: 08/23/2016  4:43 PM  Result Value Ref Range   aPTT 33 24 - 36 seconds  MRSA PCR Screening     Status: None   Collection Time: 08/25/2016  5:00 PM  Result Value Ref Range   MRSA by PCR NEGATIVE NEGATIVE    Comment:        The GeneXpert MRSA Assay (FDA approved for NASAL specimens only), is one component of a comprehensive MRSA colonization surveillance program. It is not intended to diagnose MRSA infection nor to guide or monitor treatment for MRSA infections.   Lactic acid, plasma     Status: Abnormal   Collection Time: 09/09/2016  7:19 PM  Result Value Ref Range   Lactic Acid, Venous 2.1 (HH) 0.5 - 1.9  mmol/L    Comment: CRITICAL RESULT CALLED TO, READ BACK BY AND VERIFIED WITH: HOWARD,T AT 2030 ON 8.29.2018 BY ISLEY,B   Glucose, capillary     Status: Abnormal   Collection Time: 08/17/2016  7:51 PM  Result Value Ref Range    Glucose-Capillary 121 (H) 65 - 99 mg/dL  Glucose, capillary     Status: Abnormal   Collection Time: 08/31/2016 11:43 PM  Result Value Ref Range   Glucose-Capillary 150 (H) 65 - 99 mg/dL  Glucose, capillary     Status: Abnormal   Collection Time: 09/11/16  4:00 AM  Result Value Ref Range   Glucose-Capillary 151 (H) 65 - 99 mg/dL  Comprehensive metabolic panel     Status: Abnormal   Collection Time: 09/11/16  5:32 AM  Result Value Ref Range   Sodium 142 135 - 145 mmol/L   Potassium 3.6 3.5 - 5.1 mmol/L    Comment: DELTA CHECK NOTED   Chloride 106 101 - 111 mmol/L   CO2 25 22 - 32 mmol/L   Glucose, Bld 169 (H) 65 - 99 mg/dL   BUN 33 (H) 6 - 20 mg/dL   Creatinine, Ser 1.81 (H) 0.61 - 1.24 mg/dL   Calcium 8.3 (L) 8.9 - 10.3 mg/dL   Total Protein 6.1 (L) 6.5 - 8.1 g/dL   Albumin 3.1 (L) 3.5 - 5.0 g/dL   AST 35 15 - 41 U/L   ALT 11 (L) 17 - 63 U/L   Alkaline Phosphatase 33 (L) 38 - 126 U/L   Total Bilirubin 0.8 0.3 - 1.2 mg/dL   GFR calc non Af Amer 33 (L) >60 mL/min   GFR calc Af Amer 39 (L) >60 mL/min    Comment: (NOTE) The eGFR has been calculated using the CKD EPI equation. This calculation has not been validated in all clinical situations. eGFR's persistently <60 mL/min signify possible Chronic Kidney Disease.    Anion gap 11 5 - 15  CBC     Status: None   Collection Time: 09/11/16  5:32 AM  Result Value Ref Range   WBC 7.5 4.0 - 10.5 K/uL   RBC 4.30 4.22 - 5.81 MIL/uL   Hemoglobin 13.6 13.0 - 17.0 g/dL   HCT 41.3 39.0 - 52.0 %   MCV 96.0 78.0 - 100.0 fL   MCH 31.6 26.0 - 34.0 pg   MCHC 32.9 30.0 - 36.0 g/dL   RDW 14.5 11.5 - 15.5 %   Platelets 180 150 - 400 K/uL  CBC     Status: Abnormal   Collection Time: 09/12/16  4:29 AM  Result Value Ref Range   WBC 11.8 (H) 4.0 - 10.5 K/uL   RBC 4.72 4.22 - 5.81 MIL/uL   Hemoglobin 14.6 13.0 - 17.0 g/dL   HCT 45.5 39.0 - 52.0 %   MCV 96.4 78.0 - 100.0 fL   MCH 30.9 26.0 - 34.0 pg   MCHC 32.1 30.0 - 36.0 g/dL   RDW 14.8 11.5  - 15.5 %   Platelets 224 150 - 400 K/uL    ABGS No results for input(s): PHART, PO2ART, TCO2, HCO3 in the last 72 hours.  Invalid input(s): PCO2 CULTURES Recent Results (from the past 240 hour(s))  Blood Culture (routine x 2)     Status: None (Preliminary result)   Collection Time: 08/14/2016 12:28 PM  Result Value Ref Range Status   Specimen Description BLOOD RIGHT ARM DRAWN BY RN  Final   Special Requests   Final  Blood Culture results may not be optimal due to an inadequate volume of blood received in culture bottles   Culture NO GROWTH 2 DAYS  Final   Report Status PENDING  Incomplete  Blood Culture (routine x 2)     Status: None (Preliminary result)   Collection Time: 08/17/2016 12:51 PM  Result Value Ref Range Status   Specimen Description BLOOD LEFT ARM  Final   Special Requests   Final    BOTTLES DRAWN AEROBIC AND ANAEROBIC Blood Culture adequate volume   Culture NO GROWTH 2 DAYS  Final   Report Status PENDING  Incomplete  MRSA PCR Screening     Status: None   Collection Time: 08/30/2016  5:00 PM  Result Value Ref Range Status   MRSA by PCR NEGATIVE NEGATIVE Final    Comment:        The GeneXpert MRSA Assay (FDA approved for NASAL specimens only), is one component of a comprehensive MRSA colonization surveillance program. It is not intended to diagnose MRSA infection nor to guide or monitor treatment for MRSA infections.    Studies/Results: Dg Chest 1 View  Result Date: 08/30/2016 CLINICAL DATA:  High fever and confusion.  Cough and weakness. EXAM: CHEST 1 VIEW COMPARISON:  06/24/2016. FINDINGS: Trachea is midline, given patient rotation. Heart size stable. Thoracic aorta is calcified. Coronary stent is noted. Probable mild scarring in the right midlung zone. Lungs are otherwise clear. No pleural fluid. IMPRESSION: 1. No acute findings. 2.  Aortic atherosclerosis (ICD10-170.0). Electronically Signed   By: Lorin Picket M.D.   On: 08/19/2016 12:54   Dg  Esophagus  Result Date: 09/11/2016 CLINICAL DATA:  Dysphagia. EXAM: ESOPHOGRAM/BARIUM SWALLOW TECHNIQUE: Single contrast examination was performed using  thin barium. FLUOROSCOPY TIME:  Fluoroscopy Time:  0 minutes 24 seconds and Radiation Exposure Index (if provided by the fluoroscopic device): 9.7 mGy Number of Acquired Spot Images: 6 COMPARISON:  Chest x-ray 09/03/2016. FINDINGS: Patient was brought down from the ICU for barium swallow. Chest reveals right lower lobe infiltrate. Thin barium was administered to the patient. Patient could only swallow small amount. The esophagus is widely patent. No obstructing abnormality. No reflux noted. IMPRESSION: 1. Limited exam. The esophagus is widely patent. No obstructing abnormality identified. 2.  Right lower lobe infiltrate Electronically Signed   By: Marcello Moores  Register   On: 09/11/2016 15:00   US Venous Img Lower Unilateral Right  Result Date: 09/11/2016 CLINICAL DATA:  83 year old male with right lower extremity edema. EXAM: RIGHT LOWER EXTREMITY VENOUS DOPPLER ULTRASOUND TECHNIQUE: Gray-scale sonography with graded compression, as well as color Doppler and duplex ultrasound were performed to evaluate the lower extremity deep venous systems from the level of the common femoral vein and including the common femoral, femoral, profunda femoral, popliteal and calf veins including the posterior tibial, peroneal and gastrocnemius veins when visible. The superficial great saphenous vein was also interrogated. Spectral Doppler was utilized to evaluate flow at rest and with distal augmentation maneuvers in the common femoral, femoral and popliteal veins. COMPARISON:  09/08/2012 FINDINGS: Please note, this is a slightly limited study due to patient's altered mental status. Contralateral Common Femoral Vein: Respiratory phasicity is normal and symmetric with the symptomatic side. No evidence of thrombus. Normal compressibility. Common Femoral Vein: No evidence of thrombus.  Normal compressibility, respiratory phasicity and response to augmentation. Saphenofemoral Junction: No evidence of thrombus. Normal compressibility and flow on color Doppler imaging. Profunda Femoral Vein: No evidence of thrombus. Normal compressibility and flow on color Doppler imaging.  Femoral Vein: No evidence of thrombus. Normal compressibility, respiratory phasicity and response to augmentation. Popliteal Vein: No evidence of thrombus. Normal compressibility, respiratory phasicity and response to augmentation. Calf Veins: No evidence of thrombus. Normal compressibility and flow on color Doppler imaging. Superficial Great Saphenous Vein: No evidence of thrombus. Normal compressibility and flow on color Doppler imaging. Venous Reflux:  None. Other Findings:  None. IMPRESSION: No evidence of DVT within the right lower extremity. Electronically Signed   By: Sande Brothers M.D.   On: 09/11/2016 13:35   Dg Chest Port 1 View  Result Date: 08/30/2016 CLINICAL DATA:  Hypoxia.  Abdominal distention. EXAM: PORTABLE CHEST 1 VIEW COMPARISON:  Radiographs earlier today and 06/24/2016. FINDINGS: 1633 hour. The patient's mandible overlies the upper chest. There is mild patient rotation to the right. The heart size and mediastinal contours are stable. There is aortic atherosclerosis. There is increased interstitial prominence in both lungs, likely reflecting edema. No confluent airspace opacity, pneumothorax or significant pleural effusion. IMPRESSION: Interval increased interstitial prominence in both lungs, most likely due to edema. Fever and confusion for reported on earlier study, and these findings could be due to early aspiration. Electronically Signed   By: Carey Bullocks M.D.   On: 08/13/2016 17:09   Dg Abd Portable 1v  Result Date: 08/24/2016 CLINICAL DATA:  Hypoxia and abdominal distention. EXAM: PORTABLE ABDOMEN - 1 VIEW COMPARISON:  Abdominal CT 05/26/2014. FINDINGS: 1638 hours. Two supine views are  submitted. There is gas throughout the small and large bowel. No significant bowel distension, bowel wall thickening or supine evidence of free intraperitoneal air demonstrated. There is aortoiliac atherosclerosis. No acute osseous findings are seen. IMPRESSION: No radiographic evidence of acute abdominal process. Aortic atherosclerosis. Electronically Signed   By: Carey Bullocks M.D.   On: 08/28/2016 17:11    Medications:  Prior to Admission:  Prescriptions Prior to Admission  Medication Sig Dispense Refill Last Dose  . ALPRAZolam (XANAX) 1 MG tablet Take one tablet by mouth in the morning and two tablets at bedtime for anxiety/rest (Patient taking differently: Take 1-2 mg by mouth 2 (two) times daily. Take one tablet by mouth in the morning and two tablets at bedtime for anxiety/rest) 90 tablet 5 08/18/2016 at Unknown time  . aspirin 81 MG EC tablet Take 81 mg by mouth daily.     08/15/2016 at Unknown time  . calcium carbonate (ANTACID CALCIUM) 500 MG chewable tablet Chew 2 tablets by mouth 3 (three) times daily with meals.   09/04/2016 at Unknown time  . cloNIDine (CATAPRES) 0.1 MG tablet Take 0.1 mg by mouth 2 (two) times daily.    09/06/2016 at Unknown time  . famotidine (PEPCID) 20 MG tablet Take 20 mg by mouth daily.   09/04/2016 at Unknown time  . ferrous sulfate 325 (65 FE) MG tablet Take 1 tablet (325 mg total) by mouth 2 (two) times daily with a meal.  3 08/24/2016 at Unknown time  . glipiZIDE (GLUCOTROL) 5 MG tablet Take 5 mg by mouth daily.    09/08/2016 at Unknown time  . levothyroxine (SYNTHROID, LEVOTHROID) 112 MCG tablet Take 112 mcg by mouth daily before breakfast.   09/09/2016 at Unknown time  . losartan (COZAAR) 50 MG tablet Take 50 mg by mouth daily.   10 09/07/2016 at Unknown time  . Melatonin 3 MG CAPS Take 6 mg by mouth at bedtime.   09/09/2016 at Unknown time  . memantine (NAMENDA) 10 MG tablet Take 10 mg by mouth 2 (two)  times daily.  5 08/13/2016 at Unknown time  . metFORMIN  (GLUCOPHAGE) 500 MG tablet Take 500 mg by mouth daily.   09/07/2016 at Unknown time  . omeprazole (PRILOSEC) 40 MG capsule Take 40 mg by mouth daily.   09/07/2016 at Unknown time  . tamsulosin (FLOMAX) 0.4 MG CAPS capsule Take 1 capsule (0.4 mg total) by mouth at bedtime. 30 capsule 0 09/09/2016 at Unknown time  . albuterol (PROVENTIL HFA;VENTOLIN HFA) 108 (90 Base) MCG/ACT inhaler Inhale 1-2 puffs into the lungs every 6 (six) hours as needed for wheezing or shortness of breath. 1 Inhaler 0 unknown  . dextromethorphan-guaiFENesin (TUSSIN DM) 10-100 MG/5ML liquid Take 10 mLs by mouth every 4 (four) hours as needed for cough.   unknown  . nitroGLYCERIN (NITROSTAT) 0.4 MG SL tablet Place 0.4 mg under the tongue every 5 (five) minutes as needed for chest pain.    unknown  . senna (SENOKOT) 8.6 MG TABS tablet Take 1 tablet by mouth daily as needed for mild constipation.   unknown   Scheduled: . acetylcysteine  3 mL Nebulization Q4H  . chlorhexidine  15 mL Mouth Rinse BID  . enoxaparin (LOVENOX) injection  40 mg Subcutaneous Q24H  . feeding supplement (ENSURE ENLIVE)  237 mL Oral BID BM  . ipratropium-albuterol  3 mL Nebulization Q4H  . levothyroxine  50 mcg Intravenous Daily  . mouth rinse  15 mL Mouth Rinse q12n4p  . methylPREDNISolone (SOLU-MEDROL) injection  40 mg Intravenous Q12H  . pantoprazole (PROTONIX) IV  40 mg Intravenous Q24H  . sodium chloride flush  3 mL Intravenous Q12H   Continuous: . sodium chloride 125 mL/hr at 09/12/16 0232  . piperacillin-tazobactam (ZOSYN)  IV Stopped (09/12/16 0909)   ANV:BTYOMAYOKHTXH **OR** acetaminophen, albuterol, LORazepam, ondansetron **OR** ondansetron (ZOFRAN) IV  Assesment: He was admitted with sepsis presumably from urinary tract infection. At baseline he has COPD and has dementia. He is acutely encephalopathic and has been very confused and agitated. He has been hypotensive. He has acute hypoxic respiratory failure which looks better and he has  pneumonia on the esophageal x-ray and this morning's x-ray which I have also personally reviewed looks more like he may have some edema but aspiration pneumonia could have a similar appearance he is being treated for that. His blood pressure is still somewhat soft. He is about 4 L positive since admission. Principal Problem:   Sepsis ([place]) Active Problems:   Chronic kidney disease, stage 3   Acute encephalopathy   Lower urinary tract infectious disease   COPD (chronic obstructive pulmonary disease) (HCC)   Acute respiratory failure with hypoxia (HCC)   Diabetes mellitus with nephropathy ([place])    Plan: Continue treatments. He is on appropriate antibiotics. He is on steroids. He is more calm than yesterday.    LOS: 2 days   Colan Laymon L 09/12/2016, 7:39 AM

## 2016-09-12 NOTE — Progress Notes (Signed)
Subjective: Patient is resting. He is awake but acutely sick looking. He is receiving Iv antibiotics and IV hydration..  Objective: Vital signs in last 24 hours: Temp:  [97.8 F (36.6 C)-99.4 F (37.4 C)] 97.8 F (36.6 C) (08/31 0051) Pulse Rate:  [101-136] 105 (08/31 0700) Resp:  [12-35] 28 (08/31 0700) BP: (104-145)/(44-105) 116/54 (08/31 0700) SpO2:  [80 %-97 %] 91 % (08/31 0700) Weight:  [83.6 kg (184 lb 4.9 oz)] 83.6 kg (184 lb 4.9 oz) (08/31 0328) Weight change: -1.2 kg (-2 lb 10.3 oz)    Intake/Output from previous day: 08/30 0701 - 08/31 0700 In: 3225 [I.V.:3125; IV Piggyback:100] Out: 200 [Urine:200]  PHYSICAL EXAM General appearance: delirious and no distress Resp: diminished breath sounds bilaterally and rhonchi bilaterally Cardio: S1, S2 normal GI: soft, non-tender; bowel sounds normal; no masses,  no organomegaly Extremities: extremities normal, atraumatic, no cyanosis or edema  Lab Results:  Results for orders placed or performed during the hospital encounter of 09/11/2016 (from the past 48 hour(s))  Comprehensive metabolic panel     Status: Abnormal   Collection Time: 08/25/2016 12:03 PM  Result Value Ref Range   Sodium 140 135 - 145 mmol/L   Potassium 4.4 3.5 - 5.1 mmol/L   Chloride 101 101 - 111 mmol/L   CO2 28 22 - 32 mmol/L   Glucose, Bld 110 (H) 65 - 99 mg/dL   BUN 29 (H) 6 - 20 mg/dL   Creatinine, Ser 7.91 (H) 0.61 - 1.24 mg/dL   Calcium 9.1 8.9 - 50.5 mg/dL   Total Protein 7.2 6.5 - 8.1 g/dL   Albumin 3.8 3.5 - 5.0 g/dL   AST 20 15 - 41 U/L   ALT 12 (L) 17 - 63 U/L   Alkaline Phosphatase 53 38 - 126 U/L   Total Bilirubin 0.5 0.3 - 1.2 mg/dL   GFR calc non Af Amer 35 (L) >60 mL/min   GFR calc Af Amer 40 (L) >60 mL/min    Comment: (NOTE) The eGFR has been calculated using the CKD EPI equation. This calculation has not been validated in all clinical situations. eGFR's persistently <60 mL/min signify possible Chronic Kidney Disease.    Anion gap  11 5 - 15  CBC with Differential     Status: Abnormal   Collection Time: 08/30/2016 12:03 PM  Result Value Ref Range   WBC 12.7 (H) 4.0 - 10.5 K/uL   RBC 4.60 4.22 - 5.81 MIL/uL   Hemoglobin 14.3 13.0 - 17.0 g/dL   HCT 69.7 94.8 - 01.6 %   MCV 95.4 78.0 - 100.0 fL   MCH 31.1 26.0 - 34.0 pg   MCHC 32.6 30.0 - 36.0 g/dL   RDW 55.3 74.8 - 27.0 %   Platelets 230 150 - 400 K/uL   Neutrophils Relative % 84 %   Neutro Abs 10.7 (H) 1.7 - 7.7 K/uL   Lymphocytes Relative 7 %   Lymphs Abs 0.9 0.7 - 4.0 K/uL   Monocytes Relative 8 %   Monocytes Absolute 1.1 (H) 0.1 - 1.0 K/uL   Eosinophils Relative 0 %   Eosinophils Absolute 0.0 0.0 - 0.7 K/uL   Basophils Relative 1 %   Basophils Absolute 0.1 0.0 - 0.1 K/uL  Urinalysis, Routine w reflex microscopic     Status: Abnormal   Collection Time: 08/23/2016 12:03 PM  Result Value Ref Range   Color, Urine YELLOW YELLOW   APPearance HAZY (A) CLEAR   Specific Gravity, Urine 1.017 1.005 - 1.030  pH 5.0 5.0 - 8.0   Glucose, UA NEGATIVE NEGATIVE mg/dL   Hgb urine dipstick SMALL (A) NEGATIVE   Bilirubin Urine NEGATIVE NEGATIVE   Ketones, ur NEGATIVE NEGATIVE mg/dL   Protein, ur 100 (A) NEGATIVE mg/dL   Nitrite NEGATIVE NEGATIVE   Leukocytes, UA LARGE (A) NEGATIVE   RBC / HPF 6-30 0 - 5 RBC/hpf   WBC, UA TOO NUMEROUS TO COUNT 0 - 5 WBC/hpf   Bacteria, UA NONE SEEN NONE SEEN   Squamous Epithelial / LPF 0-5 (A) NONE SEEN   WBC Clumps PRESENT    Mucus PRESENT   Blood Culture (routine x 2)     Status: None (Preliminary result)   Collection Time: 08/16/2016 12:28 PM  Result Value Ref Range   Specimen Description BLOOD RIGHT ARM DRAWN BY RN    Special Requests      Blood Culture results may not be optimal due to an inadequate volume of blood received in culture bottles   Culture NO GROWTH 2 DAYS    Report Status PENDING   I-Stat CG4 Lactic Acid, ED     Status: Abnormal   Collection Time: 09/12/2016 12:50 PM  Result Value Ref Range   Lactic Acid, Venous  3.04 (HH) 0.5 - 1.9 mmol/L  Blood Culture (routine x 2)     Status: None (Preliminary result)   Collection Time: 09/04/2016 12:51 PM  Result Value Ref Range   Specimen Description BLOOD LEFT ARM    Special Requests      BOTTLES DRAWN AEROBIC AND ANAEROBIC Blood Culture adequate volume   Culture NO GROWTH 2 DAYS    Report Status PENDING   Strep pneumoniae urinary antigen     Status: None   Collection Time: 09/09/2016  4:28 PM  Result Value Ref Range   Strep Pneumo Urinary Antigen NEGATIVE NEGATIVE    Comment:        Infection due to S. pneumoniae cannot be absolutely ruled out since the antigen present may be below the detection limit of the test. Performed at [facility], [address] [place], [place] [address]   Lactic acid, plasma     Status: Abnormal   Collection Time: 08/22/2016  4:43 PM  Result Value Ref Range   Lactic Acid, Venous 2.1 (HH) 0.5 - 1.9 mmol/L    Comment: CRITICAL RESULT CALLED TO, READ BACK BY AND VERIFIED WITH: PHILLIPS,C ON 09/09/2016 AT 1745 BY LOY,C   Procalcitonin     Status: None   Collection Time: 09/01/2016  4:43 PM  Result Value Ref Range   Procalcitonin <0.10 ng/mL    Comment:        Interpretation: PCT (Procalcitonin) <= 0.5 ng/mL: Systemic infection (sepsis) is not likely. Local bacterial infection is possible. (NOTE)         ICU PCT Algorithm               Non ICU PCT Algorithm    ----------------------------     ------------------------------         PCT < 0.25 ng/mL                 PCT < 0.1 ng/mL     Stopping of antibiotics            Stopping of antibiotics       strongly encouraged.               strongly encouraged.    ----------------------------     ------------------------------  PCT level decrease by               PCT < 0.25 ng/mL       >= 80% from peak PCT       OR PCT 0.25 - 0.5 ng/mL          Stopping of antibiotics                                             encouraged.     Stopping of antibiotics            encouraged.    ----------------------------     ------------------------------       PCT level decrease by              PCT >= 0.25 ng/mL       < 80% from peak PCT        AND PCT >= 0.5 ng/mL            Continuin g antibiotics                                              encouraged.       Continuing antibiotics            encouraged.    ----------------------------     ------------------------------     PCT level increase compared          PCT > 0.5 ng/mL         with peak PCT AND          PCT >= 0.5 ng/mL             Escalation of antibiotics                                          strongly encouraged.      Escalation of antibiotics        strongly encouraged.   Protime-INR     Status: None   Collection Time: 09/09/2016  4:43 PM  Result Value Ref Range   Prothrombin Time 13.6 11.4 - 15.2 seconds   INR 1.05   APTT     Status: None   Collection Time: 08/16/2016  4:43 PM  Result Value Ref Range   aPTT 33 24 - 36 seconds  MRSA PCR Screening     Status: None   Collection Time: 08/18/2016  5:00 PM  Result Value Ref Range   MRSA by PCR NEGATIVE NEGATIVE    Comment:        The GeneXpert MRSA Assay (FDA approved for NASAL specimens only), is one component of a comprehensive MRSA colonization surveillance program. It is not intended to diagnose MRSA infection nor to guide or monitor treatment for MRSA infections.   Lactic acid, plasma     Status: Abnormal   Collection Time: 09/04/2016  7:19 PM  Result Value Ref Range   Lactic Acid, Venous 2.1 (HH) 0.5 - 1.9 mmol/L    Comment: CRITICAL RESULT CALLED TO, READ BACK BY AND VERIFIED WITH: HOWARD,T AT 2030 ON 8.29.2018 BY ISLEY,B   Glucose, capillary     Status: Abnormal  Collection Time: 09/08/2016  7:51 PM  Result Value Ref Range   Glucose-Capillary 121 (H) 65 - 99 mg/dL  Glucose, capillary     Status: Abnormal   Collection Time: 09/01/2016 11:43 PM  Result Value Ref Range   Glucose-Capillary 150 (H) 65 - 99 mg/dL  Glucose, capillary      Status: Abnormal   Collection Time: 09/11/16  4:00 AM  Result Value Ref Range   Glucose-Capillary 151 (H) 65 - 99 mg/dL  Comprehensive metabolic panel     Status: Abnormal   Collection Time: 09/11/16  5:32 AM  Result Value Ref Range   Sodium 142 135 - 145 mmol/L   Potassium 3.6 3.5 - 5.1 mmol/L    Comment: DELTA CHECK NOTED   Chloride 106 101 - 111 mmol/L   CO2 25 22 - 32 mmol/L   Glucose, Bld 169 (H) 65 - 99 mg/dL   BUN 33 (H) 6 - 20 mg/dL   Creatinine, Ser 1.81 (H) 0.61 - 1.24 mg/dL   Calcium 8.3 (L) 8.9 - 10.3 mg/dL   Total Protein 6.1 (L) 6.5 - 8.1 g/dL   Albumin 3.1 (L) 3.5 - 5.0 g/dL   AST 35 15 - 41 U/L   ALT 11 (L) 17 - 63 U/L   Alkaline Phosphatase 33 (L) 38 - 126 U/L   Total Bilirubin 0.8 0.3 - 1.2 mg/dL   GFR calc non Af Amer 33 (L) >60 mL/min   GFR calc Af Amer 39 (L) >60 mL/min    Comment: (NOTE) The eGFR has been calculated using the CKD EPI equation. This calculation has not been validated in all clinical situations. eGFR's persistently <60 mL/min signify possible Chronic Kidney Disease.    Anion gap 11 5 - 15  CBC     Status: None   Collection Time: 09/11/16  5:32 AM  Result Value Ref Range   WBC 7.5 4.0 - 10.5 K/uL   RBC 4.30 4.22 - 5.81 MIL/uL   Hemoglobin 13.6 13.0 - 17.0 g/dL   HCT 41.3 39.0 - 52.0 %   MCV 96.0 78.0 - 100.0 fL   MCH 31.6 26.0 - 34.0 pg   MCHC 32.9 30.0 - 36.0 g/dL   RDW 14.5 11.5 - 15.5 %   Platelets 180 150 - 400 K/uL  Basic metabolic panel     Status: Abnormal   Collection Time: 09/12/16  4:29 AM  Result Value Ref Range   Sodium 144 135 - 145 mmol/L   Potassium 4.2 3.5 - 5.1 mmol/L   Chloride 112 (H) 101 - 111 mmol/L   CO2 23 22 - 32 mmol/L   Glucose, Bld 121 (H) 65 - 99 mg/dL   BUN 36 (H) 6 - 20 mg/dL   Creatinine, Ser 1.73 (H) 0.61 - 1.24 mg/dL   Calcium 8.4 (L) 8.9 - 10.3 mg/dL   GFR calc non Af Amer 35 (L) >60 mL/min   GFR calc Af Amer 41 (L) >60 mL/min    Comment: (NOTE) The eGFR has been calculated using the CKD  EPI equation. This calculation has not been validated in all clinical situations. eGFR's persistently <60 mL/min signify possible Chronic Kidney Disease.    Anion gap 9 5 - 15  CBC     Status: Abnormal   Collection Time: 09/12/16  4:29 AM  Result Value Ref Range   WBC 11.8 (H) 4.0 - 10.5 K/uL   RBC 4.72 4.22 - 5.81 MIL/uL   Hemoglobin 14.6 13.0 - 17.0 g/dL  HCT 45.5 39.0 - 52.0 %   MCV 96.4 78.0 - 100.0 fL   MCH 30.9 26.0 - 34.0 pg   MCHC 32.1 30.0 - 36.0 g/dL   RDW 34.5 18.8 - 59.4 %   Platelets 224 150 - 400 K/uL    ABGS No results for input(s): PHART, PO2ART, TCO2, HCO3 in the last 72 hours.  Invalid input(s): PCO2 CULTURES Recent Results (from the past 240 hour(s))  Blood Culture (routine x 2)     Status: None (Preliminary result)   Collection Time: 08/26/2016 12:28 PM  Result Value Ref Range Status   Specimen Description BLOOD RIGHT ARM DRAWN BY RN  Final   Special Requests   Final    Blood Culture results may not be optimal due to an inadequate volume of blood received in culture bottles   Culture NO GROWTH 2 DAYS  Final   Report Status PENDING  Incomplete  Blood Culture (routine x 2)     Status: None (Preliminary result)   Collection Time: 09/05/2016 12:51 PM  Result Value Ref Range Status   Specimen Description BLOOD LEFT ARM  Final   Special Requests   Final    BOTTLES DRAWN AEROBIC AND ANAEROBIC Blood Culture adequate volume   Culture NO GROWTH 2 DAYS  Final   Report Status PENDING  Incomplete  MRSA PCR Screening     Status: None   Collection Time: 09/06/2016  5:00 PM  Result Value Ref Range Status   MRSA by PCR NEGATIVE NEGATIVE Final    Comment:        The GeneXpert MRSA Assay (FDA approved for NASAL specimens only), is one component of a comprehensive MRSA colonization surveillance program. It is not intended to diagnose MRSA infection nor to guide or monitor treatment for MRSA infections.    Studies/Results: Dg Chest 1 View  Result Date:  09/12/2016 CLINICAL DATA:  Acute respiratory failure with hypoxia . COPD. Lower urinary tract infections. Chronic kidney disease stage 3 EXAM: CHEST 1 VIEW COMPARISON:  09/06/2016 FINDINGS: Decreased lung volumes noted. Cardiomegaly again demonstrated. Increased airspace disease seen in both lower lobes. Bilateral pleural effusions cannot be excluded. IMPRESSION: Decreased lung volumes and increased bilateral lower lobe airspace disease. Pleural effusions cannot be excluded. Electronically Signed   By: Myles Rosenthal M.D.   On: 09/12/2016 07:42   Dg Chest 1 View  Result Date: 08/31/2016 CLINICAL DATA:  High fever and confusion.  Cough and weakness. EXAM: CHEST 1 VIEW COMPARISON:  06/24/2016. FINDINGS: Trachea is midline, given patient rotation. Heart size stable. Thoracic aorta is calcified. Coronary stent is noted. Probable mild scarring in the right midlung zone. Lungs are otherwise clear. No pleural fluid. IMPRESSION: 1. No acute findings. 2.  Aortic atherosclerosis (ICD10-170.0). Electronically Signed   By: Leanna Battles M.D.   On: 08/21/2016 12:54   Dg Esophagus  Result Date: 09/11/2016 CLINICAL DATA:  Dysphagia. EXAM: ESOPHOGRAM/BARIUM SWALLOW TECHNIQUE: Single contrast examination was performed using  thin barium. FLUOROSCOPY TIME:  Fluoroscopy Time:  0 minutes 24 seconds and Radiation Exposure Index (if provided by the fluoroscopic device): 9.7 mGy Number of Acquired Spot Images: 6 COMPARISON:  Chest x-ray 08/15/2016. FINDINGS: Patient was brought down from the ICU for barium swallow. Chest reveals right lower lobe infiltrate. Thin barium was administered to the patient. Patient could only swallow small amount. The esophagus is widely patent. No obstructing abnormality. No reflux noted. IMPRESSION: 1. Limited exam. The esophagus is widely patent. No obstructing abnormality identified. 2.  Right  lower lobe infiltrate Electronically Signed   By: Marcello Moores  Register   On: 09/11/2016 15:00   US Venous Img  Lower Unilateral Right  Result Date: 09/11/2016 CLINICAL DATA:  83 year old male with right lower extremity edema. EXAM: RIGHT LOWER EXTREMITY VENOUS DOPPLER ULTRASOUND TECHNIQUE: Gray-scale sonography with graded compression, as well as color Doppler and duplex ultrasound were performed to evaluate the lower extremity deep venous systems from the level of the common femoral vein and including the common femoral, femoral, profunda femoral, popliteal and calf veins including the posterior tibial, peroneal and gastrocnemius veins when visible. The superficial great saphenous vein was also interrogated. Spectral Doppler was utilized to evaluate flow at rest and with distal augmentation maneuvers in the common femoral, femoral and popliteal veins. COMPARISON:  09/08/2012 FINDINGS: Please note, this is a slightly limited study due to patient's altered mental status. Contralateral Common Femoral Vein: Respiratory phasicity is normal and symmetric with the symptomatic side. No evidence of thrombus. Normal compressibility. Common Femoral Vein: No evidence of thrombus. Normal compressibility, respiratory phasicity and response to augmentation. Saphenofemoral Junction: No evidence of thrombus. Normal compressibility and flow on color Doppler imaging. Profunda Femoral Vein: No evidence of thrombus. Normal compressibility and flow on color Doppler imaging. Femoral Vein: No evidence of thrombus. Normal compressibility, respiratory phasicity and response to augmentation. Popliteal Vein: No evidence of thrombus. Normal compressibility, respiratory phasicity and response to augmentation. Calf Veins: No evidence of thrombus. Normal compressibility and flow on color Doppler imaging. Superficial Great Saphenous Vein: No evidence of thrombus. Normal compressibility and flow on color Doppler imaging. Venous Reflux:  None. Other Findings:  None. IMPRESSION: No evidence of DVT within the right lower extremity. Electronically Signed    By: Kristopher Oppenheim M.D.   On: 09/11/2016 13:35   Dg Chest Port 1 View  Result Date: 09/09/2016 CLINICAL DATA:  Hypoxia.  Abdominal distention. EXAM: PORTABLE CHEST 1 VIEW COMPARISON:  Radiographs earlier today and 06/24/2016. FINDINGS: 1633 hour. The patient's mandible overlies the upper chest. There is mild patient rotation to the right. The heart size and mediastinal contours are stable. There is aortic atherosclerosis. There is increased interstitial prominence in both lungs, likely reflecting edema. No confluent airspace opacity, pneumothorax or significant pleural effusion. IMPRESSION: Interval increased interstitial prominence in both lungs, most likely due to edema. Fever and confusion for reported on earlier study, and these findings could be due to early aspiration. Electronically Signed   By: Richardean Sale M.D.   On: 08/16/2016 17:09   Dg Abd Portable 1v  Result Date: 08/27/2016 CLINICAL DATA:  Hypoxia and abdominal distention. EXAM: PORTABLE ABDOMEN - 1 VIEW COMPARISON:  Abdominal CT 05/26/2014. FINDINGS: 1638 hours. Two supine views are submitted. There is gas throughout the small and large bowel. No significant bowel distension, bowel wall thickening or supine evidence of free intraperitoneal air demonstrated. There is aortoiliac atherosclerosis. No acute osseous findings are seen. IMPRESSION: No radiographic evidence of acute abdominal process. Aortic atherosclerosis. Electronically Signed   By: Richardean Sale M.D.   On: 08/21/2016 17:11    Medications: I have reviewed the patient's current medications.  Assesment:  Principal Problem:   Sepsis ([place]) Active Problems:   Chronic kidney disease, stage 3   Acute encephalopathy   Lower urinary tract infectious disease   COPD (chronic obstructive pulmonary disease) ([facility])   Acute respiratory failure with hypoxia ([facility])   Diabetes mellitus with nephropathy ([place])    Plan:  Medications reviewed Continue IV antibiotics and IV  steroid Continue IV  hydration Pulmonary consult appreciated Will do CBC/BMP    LOS: 2 days   Kamarius Buckbee 09/12/2016, 8:15 AM

## 2016-09-12 NOTE — Clinical Social Work Note (Signed)
Clinical Social Work Assessment  Patient Details  Name: [name] MRN: [id number] Date of Birth: 1933-07-20  Date of referral:  09/12/16               Reason for consult:  Discharge Planning                Permission sought to share information with:    Permission granted to share information::     Name::        Agency::  [facility].   Relationship::     Contact Information:     Housing/Transportation Living arrangements for the past 2 months:  [place] of Information:  Facility Patient Interpreter Needed:  None Criminal Activity/Legal Involvement Pertinent to Current Situation/Hospitalization:  No - Comment as needed Significant Relationships:  Other Family Members Lives with:  Facility Resident Do you feel safe going back to the place where you live?  Yes Need for family participation in patient care:  Yes (Comment)  Care giving concerns:  None identified at baseline.   Social Worker assessment / plan:  Patient has been a resident at [facility] for years. Patient is independent in ambulation and requires assistance with ADLs from staff. Patient can return to the facility at discharge.    Employment status:  Retired Nurse, adult, Medicaid In [place] PT Recommendations:  Not assessed at this time [place] / Referral to community resources:     Patient/Family's Response to care:  Patient was asleep. Will assess when he is awake.  Patient/Family's Understanding of and Emotional Response to Diagnosis, Current Treatment, and Prognosis: Will assess patient's knowledge when he is awake.   Emotional Assessment Appearance:  Appears stated age Attitude/Demeanor/Rapport:    Affect (typically observed):  Unable to Assess Orientation:  Oriented to Self, Oriented to Place, Oriented to  Time, Oriented to Situation Alcohol / Substance use:    Psych involvement (Current and /or in the community):     Discharge Needs   Concerns to be addressed:  No discharge needs identified Readmission within the last 30 days:  No Current discharge risk:  None Barriers to Discharge:      Ihor Gully, LCSW 09/12/2016, 1:15 PM

## 2016-09-13 LAB — BASIC METABOLIC PANEL
ANION GAP: 10 (ref 5–15)
BUN: 44 mg/dL — ABNORMAL HIGH (ref 6–20)
CO2: 25 mmol/L (ref 22–32)
Calcium: 8.9 mg/dL (ref 8.9–10.3)
Chloride: 111 mmol/L (ref 101–111)
Creatinine, Ser: 1.77 mg/dL — ABNORMAL HIGH (ref 0.61–1.24)
GFR calc Af Amer: 40 mL/min — ABNORMAL LOW (ref >60)
GFR, EST NON AFRICAN AMERICAN: 34 mL/min — AB (ref >60)
Glucose, Bld: 159 mg/dL — ABNORMAL HIGH (ref 65–99)
Potassium: 3.8 mmol/L (ref 3.5–5.1)
SODIUM: 146 mmol/L — AB (ref 135–145)

## 2016-09-13 LAB — CBC WITH DIFFERENTIAL/PLATELET
BASOS PCT: 0 %
Basophils Absolute: 0 10*3/uL (ref 0.0–0.1)
EOS ABS: 0 10*3/uL (ref 0.0–0.7)
EOS PCT: 0 %
HCT: 45.4 % (ref 39.0–52.0)
HEMOGLOBIN: 14.7 g/dL (ref 13.0–17.0)
Lymphocytes Relative: 4 %
Lymphs Abs: 0.6 10*3/uL — ABNORMAL LOW (ref 0.7–4.0)
MCH: 31.5 pg (ref 26.0–34.0)
MCHC: 32.4 g/dL (ref 30.0–36.0)
MCV: 97.2 fL (ref 78.0–100.0)
MONOS PCT: 6 %
Monocytes Absolute: 0.7 10*3/uL (ref 0.1–1.0)
Neutro Abs: 11.7 10*3/uL — ABNORMAL HIGH (ref 1.7–7.7)
Neutrophils Relative %: 90 %
PLATELETS: 255 10*3/uL (ref 150–400)
RBC: 4.67 MIL/uL (ref 4.22–5.81)
RDW: 15.4 % (ref 11.5–15.5)
WBC: 13 10*3/uL — ABNORMAL HIGH (ref 4.0–10.5)

## 2016-09-13 LAB — GLUCOSE, CAPILLARY
Glucose-Capillary: 163 mg/dL — ABNORMAL HIGH (ref 65–99)
Glucose-Capillary: 155 mg/dL — ABNORMAL HIGH (ref 65–99)

## 2016-09-13 MED ORDER — IPRATROPIUM-ALBUTEROL 0.5-2.5 (3) MG/3ML IN SOLN: 3.0000 mL | Freq: Four times a day (QID) | RESPIRATORY_TRACT | Status: DC

## 2016-09-13 MED ORDER — FUROSEMIDE 10 MG/ML IJ SOLN
40.0000 mg | Freq: Two times a day (BID) | INTRAMUSCULAR | Status: AC
Start: 2016-09-13 — End: 2016-09-14
  Administered 2016-09-13 – 2016-09-14 (×2): 40 mg via INTRAVENOUS
  Filled 2016-09-13 (×2): qty 4

## 2016-09-13 MED ORDER — POTASSIUM CHLORIDE CRYS ER 20 MEQ PO TBCR: 20.0000 meq | EXTENDED_RELEASE_TABLET | Freq: Two times a day (BID) | ORAL | Status: DC

## 2016-09-13 MED ORDER — POTASSIUM CHLORIDE 10 MEQ/100ML IV SOLN
10.0000 meq | INTRAVENOUS | Status: AC
  Administered 2016-09-13 (×4): 10 meq via INTRAVENOUS
  Filled 2016-09-13 (×3): qty 100

## 2016-09-13 NOTE — Progress Notes (Signed)
Subjective: He had trouble last night with what appeared to be volume overloaded from fluid resuscitation. He is on Lasix and has a Foley catheter now. No other new complaints noted. He is more alert this morning.  Objective: Vital signs in last 24 hours: Temp:  [98 F (36.7 C)-99 F (37.2 C)] 98 F (36.7 C) (09/01 0712) Pulse Rate:  [92-144] 106 (09/01 0600) Resp:  [19-34] 23 (09/01 0600) BP: (99-147)/(55-99) 128/63 (09/01 0600) SpO2:  [87 %-96 %] 96 % (09/01 0600) Weight:  [84.2 kg (185 lb 10 oz)] 84.2 kg (185 lb 10 oz) (09/01 0400) Weight change: 0.6 kg (1 lb 5.2 oz) Last BM Date: 09/11/16  Intake/Output from previous day: 08/31 0701 - 09/01 0700 In: 2106.7 [P.O.:120; I.V.:1786.7; IV Piggyback:200] Out: 600 [Urine:600]  PHYSICAL EXAM General appearance: Still sleepy but more arousable Resp: rhonchi bilaterally Cardio: His heart is irregular in the rhythm by monitor looks like it might be multifocal atrial tachycardia GI: soft, non-tender; bowel sounds normal; no masses,  no organomegaly Extremities: extremities normal, atraumatic, no cyanosis or edema Still has some third spacing of fluid  Lab Results:  Results for orders placed or performed during the hospital encounter of 08/31/2016 (from the past 48 hour(s))  Basic metabolic panel     Status: Abnormal   Collection Time: 09/12/16  4:29 AM  Result Value Ref Range   Sodium 144 135 - 145 mmol/L   Potassium 4.2 3.5 - 5.1 mmol/L   Chloride 112 (H) 101 - 111 mmol/L   CO2 23 22 - 32 mmol/L   Glucose, Bld 121 (H) 65 - 99 mg/dL   BUN 36 (H) 6 - 20 mg/dL   Creatinine, Ser 1.73 (H) 0.61 - 1.24 mg/dL   Calcium 8.4 (L) 8.9 - 10.3 mg/dL   GFR calc non Af Amer 35 (L) >60 mL/min   GFR calc Af Amer 41 (L) >60 mL/min    Comment: (NOTE) The eGFR has been calculated using the CKD EPI equation. This calculation has not been validated in all clinical situations. eGFR's persistently <60 mL/min signify possible Chronic Kidney Disease.     Anion gap 9 5 - 15  CBC     Status: Abnormal   Collection Time: 09/12/16  4:29 AM  Result Value Ref Range   WBC 11.8 (H) 4.0 - 10.5 K/uL   RBC 4.72 4.22 - 5.81 MIL/uL   Hemoglobin 14.6 13.0 - 17.0 g/dL   HCT 45.5 39.0 - 52.0 %   MCV 96.4 78.0 - 100.0 fL   MCH 30.9 26.0 - 34.0 pg   MCHC 32.1 30.0 - 36.0 g/dL   RDW 14.8 11.5 - 15.5 %   Platelets 224 150 - 400 K/uL  Basic metabolic panel     Status: Abnormal   Collection Time: 09/13/16  4:46 AM  Result Value Ref Range   Sodium 146 (H) 135 - 145 mmol/L   Potassium 3.8 3.5 - 5.1 mmol/L   Chloride 111 101 - 111 mmol/L   CO2 25 22 - 32 mmol/L   Glucose, Bld 159 (H) 65 - 99 mg/dL   BUN 44 (H) 6 - 20 mg/dL   Creatinine, Ser 1.77 (H) 0.61 - 1.24 mg/dL   Calcium 8.9 8.9 - 10.3 mg/dL   GFR calc non Af Amer 34 (L) >60 mL/min   GFR calc Af Amer 40 (L) >60 mL/min    Comment: (NOTE) The eGFR has been calculated using the CKD EPI equation. This calculation has not been validated  in all clinical situations. eGFR's persistently <60 mL/min signify possible Chronic Kidney Disease.    Anion gap 10 5 - 15  CBC with Differential/Platelet     Status: Abnormal   Collection Time: 09/13/16  4:46 AM  Result Value Ref Range   WBC 13.0 (H) 4.0 - 10.5 K/uL   RBC 4.67 4.22 - 5.81 MIL/uL   Hemoglobin 14.7 13.0 - 17.0 g/dL   HCT 97.3 66.6 - 59.6 %   MCV 97.2 78.0 - 100.0 fL   MCH 31.5 26.0 - 34.0 pg   MCHC 32.4 30.0 - 36.0 g/dL   RDW 60.7 21.0 - 37.4 %   Platelets 255 150 - 400 K/uL   Neutrophils Relative % 90 %   Neutro Abs 11.7 (H) 1.7 - 7.7 K/uL   Lymphocytes Relative 4 %   Lymphs Abs 0.6 (L) 0.7 - 4.0 K/uL   Monocytes Relative 6 %   Monocytes Absolute 0.7 0.1 - 1.0 K/uL   Eosinophils Relative 0 %   Eosinophils Absolute 0.0 0.0 - 0.7 K/uL   Basophils Relative 0 %   Basophils Absolute 0.0 0.0 - 0.1 K/uL    ABGS No results for input(s): PHART, PO2ART, TCO2, HCO3 in the last 72 hours.  Invalid input(s): PCO2 CULTURES Recent Results  (from the past 240 hour(s))  Blood Culture (routine x 2)     Status: None (Preliminary result)   Collection Time: 08/28/2016 12:28 PM  Result Value Ref Range Status   Specimen Description BLOOD RIGHT ARM DRAWN BY RN  Final   Special Requests   Final    Blood Culture results may not be optimal due to an inadequate volume of blood received in culture bottles   Culture NO GROWTH 2 DAYS  Final   Report Status PENDING  Incomplete  Urine culture     Status: None   Collection Time: 08/17/2016 12:29 PM  Result Value Ref Range Status   Specimen Description URINE, CATHETERIZED  Final   Special Requests Normal  Final   Culture   Final    NO GROWTH Performed at [facility], [address] [place], [place] [address]    Report Status 09/12/2016 FINAL  Final  Blood Culture (routine x 2)     Status: None (Preliminary result)   Collection Time: 09/07/2016 12:51 PM  Result Value Ref Range Status   Specimen Description BLOOD LEFT ARM  Final   Special Requests   Final    BOTTLES DRAWN AEROBIC AND ANAEROBIC Blood Culture adequate volume   Culture NO GROWTH 2 DAYS  Final   Report Status PENDING  Incomplete  MRSA PCR Screening     Status: None   Collection Time: 08/22/2016  5:00 PM  Result Value Ref Range Status   MRSA by PCR NEGATIVE NEGATIVE Final    Comment:        The GeneXpert MRSA Assay (FDA approved for NASAL specimens only), is one component of a comprehensive MRSA colonization surveillance program. It is not intended to diagnose MRSA infection nor to guide or monitor treatment for MRSA infections.    Studies/Results: Dg Chest 1 View  Result Date: 09/12/2016 CLINICAL DATA:  Acute respiratory failure with hypoxia . COPD. Lower urinary tract infections. Chronic kidney disease stage 3 EXAM: CHEST 1 VIEW COMPARISON:  08/18/2016 FINDINGS: Decreased lung volumes noted. Cardiomegaly again demonstrated. Increased airspace disease seen in both lower lobes. Bilateral pleural effusions cannot be  excluded. IMPRESSION: Decreased lung volumes and increased bilateral lower lobe  airspace disease. Pleural effusions cannot be excluded. Electronically Signed   By: Earle Gell M.D.   On: 09/12/2016 07:42   Dg Esophagus  Result Date: 09/11/2016 CLINICAL DATA:  Dysphagia. EXAM: ESOPHOGRAM/BARIUM SWALLOW TECHNIQUE: Single contrast examination was performed using  thin barium. FLUOROSCOPY TIME:  Fluoroscopy Time:  0 minutes 24 seconds and Radiation Exposure Index (if provided by the fluoroscopic device): 9.7 mGy Number of Acquired Spot Images: 6 COMPARISON:  Chest x-ray 08/23/2016. FINDINGS: Patient was brought down from the ICU for barium swallow. Chest reveals right lower lobe infiltrate. Thin barium was administered to the patient. Patient could only swallow small amount. The esophagus is widely patent. No obstructing abnormality. No reflux noted. IMPRESSION: 1. Limited exam. The esophagus is widely patent. No obstructing abnormality identified. 2.  Right lower lobe infiltrate Electronically Signed   By: Marcello Moores  Register   On: 09/11/2016 15:00   US Venous Img Lower Unilateral Right  Result Date: 09/11/2016 CLINICAL DATA:  83 year old male with right lower extremity edema. EXAM: RIGHT LOWER EXTREMITY VENOUS DOPPLER ULTRASOUND TECHNIQUE: Gray-scale sonography with graded compression, as well as color Doppler and duplex ultrasound were performed to evaluate the lower extremity deep venous systems from the level of the common femoral vein and including the common femoral, femoral, profunda femoral, popliteal and calf veins including the posterior tibial, peroneal and gastrocnemius veins when visible. The superficial great saphenous vein was also interrogated. Spectral Doppler was utilized to evaluate flow at rest and with distal augmentation maneuvers in the common femoral, femoral and popliteal veins. COMPARISON:  09/08/2012 FINDINGS: Please note, this is a slightly limited study due to patient's altered mental  status. Contralateral Common Femoral Vein: Respiratory phasicity is normal and symmetric with the symptomatic side. No evidence of thrombus. Normal compressibility. Common Femoral Vein: No evidence of thrombus. Normal compressibility, respiratory phasicity and response to augmentation. Saphenofemoral Junction: No evidence of thrombus. Normal compressibility and flow on color Doppler imaging. Profunda Femoral Vein: No evidence of thrombus. Normal compressibility and flow on color Doppler imaging. Femoral Vein: No evidence of thrombus. Normal compressibility, respiratory phasicity and response to augmentation. Popliteal Vein: No evidence of thrombus. Normal compressibility, respiratory phasicity and response to augmentation. Calf Veins: No evidence of thrombus. Normal compressibility and flow on color Doppler imaging. Superficial Great Saphenous Vein: No evidence of thrombus. Normal compressibility and flow on color Doppler imaging. Venous Reflux:  None. Other Findings:  None. IMPRESSION: No evidence of DVT within the right lower extremity. Electronically Signed   By: Kristopher Oppenheim M.D.   On: 09/11/2016 13:35    Medications:  Prior to Admission:  Prescriptions Prior to Admission  Medication Sig Dispense Refill Last Dose  . ALPRAZolam (XANAX) 1 MG tablet Take one tablet by mouth in the morning and two tablets at bedtime for anxiety/rest (Patient taking differently: Take 1-2 mg by mouth 2 (two) times daily. Take one tablet by mouth in the morning and two tablets at bedtime for anxiety/rest) 90 tablet 5 08/26/2016 at Unknown time  . aspirin 81 MG EC tablet Take 81 mg by mouth daily.     08/31/2016 at Unknown time  . calcium carbonate (ANTACID CALCIUM) 500 MG chewable tablet Chew 2 tablets by mouth 3 (three) times daily with meals.   08/31/2016 at Unknown time  . cloNIDine (CATAPRES) 0.1 MG tablet Take 0.1 mg by mouth 2 (two) times daily.    08/29/2016 at Unknown time  . famotidine (PEPCID) 20 MG tablet Take 20 mg  by mouth daily.  09/02/2016 at Unknown time  . ferrous sulfate 325 (65 FE) MG tablet Take 1 tablet (325 mg total) by mouth 2 (two) times daily with a meal.  3 09/07/2016 at Unknown time  . glipiZIDE (GLUCOTROL) 5 MG tablet Take 5 mg by mouth daily.    08/18/2016 at Unknown time  . levothyroxine (SYNTHROID, LEVOTHROID) 112 MCG tablet Take 112 mcg by mouth daily before breakfast.   08/15/2016 at Unknown time  . losartan (COZAAR) 50 MG tablet Take 50 mg by mouth daily.   10 08/27/2016 at Unknown time  . Melatonin 3 MG CAPS Take 6 mg by mouth at bedtime.   09/09/2016 at Unknown time  . memantine (NAMENDA) 10 MG tablet Take 10 mg by mouth 2 (two) times daily.  5 08/21/2016 at Unknown time  . metFORMIN (GLUCOPHAGE) 500 MG tablet Take 500 mg by mouth daily.   08/25/2016 at Unknown time  . omeprazole (PRILOSEC) 40 MG capsule Take 40 mg by mouth daily.   09/04/2016 at Unknown time  . tamsulosin (FLOMAX) 0.4 MG CAPS capsule Take 1 capsule (0.4 mg total) by mouth at bedtime. 30 capsule 0 09/09/2016 at Unknown time  . albuterol (PROVENTIL HFA;VENTOLIN HFA) 108 (90 Base) MCG/ACT inhaler Inhale 1-2 puffs into the lungs every 6 (six) hours as needed for wheezing or shortness of breath. 1 Inhaler 0 unknown  . dextromethorphan-guaiFENesin (TUSSIN DM) 10-100 MG/5ML liquid Take 10 mLs by mouth every 4 (four) hours as needed for cough.   unknown  . nitroGLYCERIN (NITROSTAT) 0.4 MG SL tablet Place 0.4 mg under the tongue every 5 (five) minutes as needed for chest pain.    unknown  . senna (SENOKOT) 8.6 MG TABS tablet Take 1 tablet by mouth daily as needed for mild constipation.   unknown   Scheduled: . acetylcysteine  3 mL Nebulization Q4H  . chlorhexidine  15 mL Mouth Rinse BID  . enoxaparin (LOVENOX) injection  40 mg Subcutaneous Q24H  . feeding supplement (ENSURE ENLIVE)  237 mL Oral BID BM  . ipratropium-albuterol  3 mL Nebulization Q4H  . levothyroxine  50 mcg Intravenous Daily  . mouth rinse  15 mL Mouth Rinse q12n4p   . methylPREDNISolone (SOLU-MEDROL) injection  40 mg Intravenous Q12H  . pantoprazole (PROTONIX) IV  40 mg Intravenous Q24H  . sodium chloride flush  3 mL Intravenous Q12H   Continuous: . sodium chloride 75 mL/hr at 09/12/16 2339  . piperacillin-tazobactam (ZOSYN)  IV 3.375 g (09/13/16 0510)   WUJ:[id number] **OR** acetaminophen, albuterol, LORazepam, ondansetron **OR** ondansetron (ZOFRAN) IV  Assesment: He was admitted with sepsis presumably related to urinary tract infection but he also has what looks like he may be a pneumonia. He had volume overload last night but that's better I think. He has had acute hypoxic respiratory failure and had more breathing issues last night. He has acute encephalopathy and at baseline has dementia. He is more awake today. Principal Problem:   Sepsis ([place]) Active Problems:   Chronic kidney disease, stage 3   Acute encephalopathy   Lower urinary tract infectious disease   COPD (chronic obstructive pulmonary disease) (HCC)   Acute respiratory failure with hypoxia (HCC)   Diabetes mellitus with nephropathy ([place])    Plan: Continue Lasix at least another 24 hours. Leave Foley in place because of aggressive diuresis. Continue Zosyn.    LOS: 3 days   Karinne Schmader L 09/13/2016, 9:02 AM

## 2016-09-13 NOTE — Progress Notes (Signed)
Subjective: Patient is more awake. His breathing is improving. No fever or chills.  Objective: Vital signs in last 24 hours: Temp:  [98 F (36.7 C)-99 F (37.2 C)] 98 F (36.7 C) (09/01 0712) Pulse Rate:  [92-144] 106 (09/01 0600) Resp:  [19-34] 23 (09/01 0600) BP: (99-147)/(55-99) 128/63 (09/01 0600) SpO2:  [87 %-96 %] 96 % (09/01 0600) Weight:  [84.2 kg (185 lb 10 oz)] 84.2 kg (185 lb 10 oz) (09/01 0400) Weight change: 0.6 kg (1 lb 5.2 oz) Last BM Date: 09/11/16  Intake/Output from previous day: 08/31 0701 - 09/01 0700 In: 2106.7 [P.O.:120; I.V.:1786.7; IV Piggyback:200] Out: 600 [Urine:600]  PHYSICAL EXAM General appearance: delirious and no distress Resp: diminished breath sounds bilaterally and rhonchi bilaterally Cardio: S1, S2 normal GI: soft, non-tender; bowel sounds normal; no masses,  no organomegaly Extremities: extremities normal, atraumatic, no cyanosis or edema  Lab Results:  Results for orders placed or performed during the hospital encounter of 09/02/2016 (from the past 48 hour(s))  Basic metabolic panel     Status: Abnormal   Collection Time: 09/12/16  4:29 AM  Result Value Ref Range   Sodium 144 135 - 145 mmol/L   Potassium 4.2 3.5 - 5.1 mmol/L   Chloride 112 (H) 101 - 111 mmol/L   CO2 23 22 - 32 mmol/L   Glucose, Bld 121 (H) 65 - 99 mg/dL   BUN 36 (H) 6 - 20 mg/dL   Creatinine, Ser 1.73 (H) 0.61 - 1.24 mg/dL   Calcium 8.4 (L) 8.9 - 10.3 mg/dL   GFR calc non Af Amer 35 (L) >60 mL/min   GFR calc Af Amer 41 (L) >60 mL/min    Comment: (NOTE) The eGFR has been calculated using the CKD EPI equation. This calculation has not been validated in all clinical situations. eGFR's persistently <60 mL/min signify possible Chronic Kidney Disease.    Anion gap 9 5 - 15  CBC     Status: Abnormal   Collection Time: 09/12/16  4:29 AM  Result Value Ref Range   WBC 11.8 (H) 4.0 - 10.5 K/uL   RBC 4.72 4.22 - 5.81 MIL/uL   Hemoglobin 14.6 13.0 - 17.0 g/dL   HCT 45.5  39.0 - 52.0 %   MCV 96.4 78.0 - 100.0 fL   MCH 30.9 26.0 - 34.0 pg   MCHC 32.1 30.0 - 36.0 g/dL   RDW 14.8 11.5 - 15.5 %   Platelets 224 150 - 400 K/uL  Basic metabolic panel     Status: Abnormal   Collection Time: 09/13/16  4:46 AM  Result Value Ref Range   Sodium 146 (H) 135 - 145 mmol/L   Potassium 3.8 3.5 - 5.1 mmol/L   Chloride 111 101 - 111 mmol/L   CO2 25 22 - 32 mmol/L   Glucose, Bld 159 (H) 65 - 99 mg/dL   BUN 44 (H) 6 - 20 mg/dL   Creatinine, Ser 1.77 (H) 0.61 - 1.24 mg/dL   Calcium 8.9 8.9 - 10.3 mg/dL   GFR calc non Af Amer 34 (L) >60 mL/min   GFR calc Af Amer 40 (L) >60 mL/min    Comment: (NOTE) The eGFR has been calculated using the CKD EPI equation. This calculation has not been validated in all clinical situations. eGFR's persistently <60 mL/min signify possible Chronic Kidney Disease.    Anion gap 10 5 - 15  CBC with Differential/Platelet     Status: Abnormal   Collection Time: 09/13/16  4:46 AM  Result Value Ref Range   WBC 13.0 (H) 4.0 - 10.5 K/uL   RBC 4.67 4.22 - 5.81 MIL/uL   Hemoglobin 14.7 13.0 - 17.0 g/dL   HCT 45.4 39.0 - 52.0 %   MCV 97.2 78.0 - 100.0 fL   MCH 31.5 26.0 - 34.0 pg   MCHC 32.4 30.0 - 36.0 g/dL   RDW 15.4 11.5 - 15.5 %   Platelets 255 150 - 400 K/uL   Neutrophils Relative % 90 %   Neutro Abs 11.7 (H) 1.7 - 7.7 K/uL   Lymphocytes Relative 4 %   Lymphs Abs 0.6 (L) 0.7 - 4.0 K/uL   Monocytes Relative 6 %   Monocytes Absolute 0.7 0.1 - 1.0 K/uL   Eosinophils Relative 0 %   Eosinophils Absolute 0.0 0.0 - 0.7 K/uL   Basophils Relative 0 %   Basophils Absolute 0.0 0.0 - 0.1 K/uL    ABGS No results for input(s): PHART, PO2ART, TCO2, HCO3 in the last 72 hours.  Invalid input(s): PCO2 CULTURES Recent Results (from the past 240 hour(s))  Blood Culture (routine x 2)     Status: None (Preliminary result)   Collection Time: 08/24/2016 12:28 PM  Result Value Ref Range Status   Specimen Description BLOOD RIGHT ARM DRAWN BY RN  Final    Special Requests   Final    Blood Culture results may not be optimal due to an inadequate volume of blood received in culture bottles   Culture NO GROWTH 2 DAYS  Final   Report Status PENDING  Incomplete  Urine culture     Status: None   Collection Time: 08/22/2016 12:29 PM  Result Value Ref Range Status   Specimen Description URINE, CATHETERIZED  Final   Special Requests Normal  Final   Culture   Final    NO GROWTH Performed at [facility], [place] [address] [place], [place] [address]    Report Status 09/12/2016 FINAL  Final  Blood Culture (routine x 2)     Status: None (Preliminary result)   Collection Time: 09/02/2016 12:51 PM  Result Value Ref Range Status   Specimen Description BLOOD LEFT ARM  Final   Special Requests   Final    BOTTLES DRAWN AEROBIC AND ANAEROBIC Blood Culture adequate volume   Culture NO GROWTH 2 DAYS  Final   Report Status PENDING  Incomplete  MRSA PCR Screening     Status: None   Collection Time: 08/31/2016  5:00 PM  Result Value Ref Range Status   MRSA by PCR NEGATIVE NEGATIVE Final    Comment:        The GeneXpert MRSA Assay (FDA approved for NASAL specimens only), is one component of a comprehensive MRSA colonization surveillance program. It is not intended to diagnose MRSA infection nor to guide or monitor treatment for MRSA infections.    Studies/Results: Dg Chest 1 View  Result Date: 09/12/2016 CLINICAL DATA:  Acute respiratory failure with hypoxia . COPD. Lower urinary tract infections. Chronic kidney disease stage 3 EXAM: CHEST 1 VIEW COMPARISON:  08/21/2016 FINDINGS: Decreased lung volumes noted. Cardiomegaly again demonstrated. Increased airspace disease seen in both lower lobes. Bilateral pleural effusions cannot be excluded. IMPRESSION: Decreased lung volumes and increased bilateral lower lobe airspace disease. Pleural effusions cannot be excluded. Electronically Signed   By: Earle Gell M.D.   On: 09/12/2016 07:42   Dg  Esophagus  Result Date: 09/11/2016 CLINICAL DATA:  Dysphagia. EXAM: ESOPHOGRAM/BARIUM SWALLOW TECHNIQUE: Single contrast examination was  performed using  thin barium. FLUOROSCOPY TIME:  Fluoroscopy Time:  0 minutes 24 seconds and Radiation Exposure Index (if provided by the fluoroscopic device): 9.7 mGy Number of Acquired Spot Images: 6 COMPARISON:  Chest x-ray 08/18/2016. FINDINGS: Patient was brought down from the ICU for barium swallow. Chest reveals right lower lobe infiltrate. Thin barium was administered to the patient. Patient could only swallow small amount. The esophagus is widely patent. No obstructing abnormality. No reflux noted. IMPRESSION: 1. Limited exam. The esophagus is widely patent. No obstructing abnormality identified. 2.  Right lower lobe infiltrate Electronically Signed   By: Marcello Moores  Register   On: 09/11/2016 15:00   US Venous Img Lower Unilateral Right  Result Date: 09/11/2016 CLINICAL DATA:  83 year old male with right lower extremity edema. EXAM: RIGHT LOWER EXTREMITY VENOUS DOPPLER ULTRASOUND TECHNIQUE: Gray-scale sonography with graded compression, as well as color Doppler and duplex ultrasound were performed to evaluate the lower extremity deep venous systems from the level of the common femoral vein and including the common femoral, femoral, profunda femoral, popliteal and calf veins including the posterior tibial, peroneal and gastrocnemius veins when visible. The superficial great saphenous vein was also interrogated. Spectral Doppler was utilized to evaluate flow at rest and with distal augmentation maneuvers in the common femoral, femoral and popliteal veins. COMPARISON:  09/08/2012 FINDINGS: Please note, this is a slightly limited study due to patient's altered mental status. Contralateral Common Femoral Vein: Respiratory phasicity is normal and symmetric with the symptomatic side. No evidence of thrombus. Normal compressibility. Common Femoral Vein: No evidence of thrombus.  Normal compressibility, respiratory phasicity and response to augmentation. Saphenofemoral Junction: No evidence of thrombus. Normal compressibility and flow on color Doppler imaging. Profunda Femoral Vein: No evidence of thrombus. Normal compressibility and flow on color Doppler imaging. Femoral Vein: No evidence of thrombus. Normal compressibility, respiratory phasicity and response to augmentation. Popliteal Vein: No evidence of thrombus. Normal compressibility, respiratory phasicity and response to augmentation. Calf Veins: No evidence of thrombus. Normal compressibility and flow on color Doppler imaging. Superficial Great Saphenous Vein: No evidence of thrombus. Normal compressibility and flow on color Doppler imaging. Venous Reflux:  None. Other Findings:  None. IMPRESSION: No evidence of DVT within the right lower extremity. Electronically Signed   By: Kristopher Oppenheim M.D.   On: 09/11/2016 13:35    Medications: I have reviewed the patient's current medications.  Assesment:  Principal Problem:   Sepsis ([place]) Active Problems:   Chronic kidney disease, stage 3   Acute encephalopathy   Lower urinary tract infectious disease   COPD (chronic obstructive pulmonary disease) (HCC)   Acute respiratory failure with hypoxia (HCC)   Diabetes mellitus with nephropathy ([place])    Plan:  Medications reviewed Continue IV antibiotics and IV steroid Continue IV hydration Pulmonary consult appreciated Will do CBC/BMP    LOS: 3 days   Delona Clasby 09/13/2016, 8:32 AM

## 2016-09-13 NOTE — Progress Notes (Addendum)
Decreasing mucolytic to qid mainly because patient appears more confused at night. He is requiring ativan at night because of his confusion. Mucomyst takes longer to deliver with each neb and seems to help cause more confusion at night.

## 2016-09-13 DEATH — deceased

## 2016-09-14 ENCOUNTER — Inpatient Hospital Stay (HOSPITAL_COMMUNITY): Payer: Medicare Other

## 2016-09-14 LAB — BASIC METABOLIC PANEL
Anion gap: 11 (ref 5–15)
BUN: 56 mg/dL — AB (ref 6–20)
CHLORIDE: 108 mmol/L (ref 101–111)
CO2: 27 mmol/L (ref 22–32)
Calcium: 9.3 mg/dL (ref 8.9–10.3)
Creatinine, Ser: 1.85 mg/dL — ABNORMAL HIGH (ref 0.61–1.24)
GFR calc Af Amer: 38 mL/min — ABNORMAL LOW (ref >60)
GFR calc non Af Amer: 33 mL/min — ABNORMAL LOW (ref >60)
GLUCOSE: 162 mg/dL — AB (ref 65–99)
POTASSIUM: 3.7 mmol/L (ref 3.5–5.1)
Sodium: 146 mmol/L — ABNORMAL HIGH (ref 135–145)

## 2016-09-14 LAB — GLUCOSE, CAPILLARY
GLUCOSE-CAPILLARY: 156 mg/dL — AB (ref 65–99)
GLUCOSE-CAPILLARY: 173 mg/dL — AB (ref 65–99)
GLUCOSE-CAPILLARY: 211 mg/dL — AB (ref 65–99)
GLUCOSE-CAPILLARY: 236 mg/dL — AB (ref 65–99)
GLUCOSE-CAPILLARY: 278 mg/dL — AB (ref 65–99)
Glucose-Capillary: 153 mg/dL — ABNORMAL HIGH (ref 65–99)

## 2016-09-14 MED ORDER — ACETYLCYSTEINE 20 % IN SOLN
3.0000 mL | Freq: Four times a day (QID) | RESPIRATORY_TRACT | Status: DC
  Administered 2016-09-14 – 2016-09-17 (×12): 3 mL via RESPIRATORY_TRACT
  Filled 2016-09-14 (×12): qty 4

## 2016-09-14 MED ORDER — DEXTROSE-NACL 5-0.45 % IV SOLN
INTRAVENOUS | Status: DC
  Administered 2016-09-14 – 2016-09-15 (×4): via INTRAVENOUS

## 2016-09-14 MED ORDER — FUROSEMIDE 10 MG/ML IJ SOLN
40.0000 mg | Freq: Two times a day (BID) | INTRAMUSCULAR | Status: DC
  Administered 2016-09-14 – 2016-09-17 (×7): 40 mg via INTRAVENOUS
  Filled 2016-09-14 (×7): qty 4

## 2016-09-14 MED ORDER — POTASSIUM CHLORIDE 10 MEQ/100ML IV SOLN
10.0000 meq | INTRAVENOUS | Status: AC
  Administered 2016-09-14 (×4): 10 meq via INTRAVENOUS
  Filled 2016-09-14 (×4): qty 100

## 2016-09-14 MED ORDER — IPRATROPIUM-ALBUTEROL 0.5-2.5 (3) MG/3ML IN SOLN
3.0000 mL | RESPIRATORY_TRACT | Status: DC
  Administered 2016-09-14 – 2016-09-17 (×18): 3 mL via RESPIRATORY_TRACT
  Filled 2016-09-14 (×18): qty 3

## 2016-09-14 NOTE — Progress Notes (Signed)
Patient has been up and all bed tonight. His dementia is worse at night even with sedation.

## 2016-09-14 NOTE — Progress Notes (Signed)
Subjective: He's overall about the same. He had significant issues with swallowing yesterday so I requested that he be made nothing by mouth until we can get some sort of swallowing evaluation done. His respirations are better.  Objective: Vital signs in last 24 hours: Temp:  [97.3 F (36.3 C)-98.9 F (37.2 C)] 97.3 F (36.3 C) (09/02 0724) Pulse Rate:  [95-148] 116 (09/02 0724) Resp:  [22-37] 26 (09/02 0724) BP: (93-140)/(55-128) 125/77 (09/02 0600) SpO2:  [79 %-95 %] 90 % (09/02 0724) Weight:  [81.1 kg (178 lb 12.7 oz)] 81.1 kg (178 lb 12.7 oz) (09/02 0500) Weight change: -3.1 kg (-6 lb 13.4 oz) Last BM Date: 09/13/16  Intake/Output from previous day: 09/01 0701 - 09/02 0700 In: 1378.8 [I.V.:1278.8; IV Piggyback:100] Out: 4602 [Urine:4600; Stool:2]  PHYSICAL EXAM General appearance: He will wake up but he is generally sleepy Resp: rhonchi bilaterally Cardio: Heart rate still irregular GI: soft, non-tender; bowel sounds normal; no masses,  no organomegaly Extremities: extremities normal, atraumatic, no cyanosis or edema Skin warm and dry  Lab Results:  Results for orders placed or performed during the hospital encounter of 08/31/2016 (from the past 48 hour(s))  Basic metabolic panel     Status: Abnormal   Collection Time: 09/13/16  4:46 AM  Result Value Ref Range   Sodium 146 (H) 135 - 145 mmol/L   Potassium 3.8 3.5 - 5.1 mmol/L   Chloride 111 101 - 111 mmol/L   CO2 25 22 - 32 mmol/L   Glucose, Bld 159 (H) 65 - 99 mg/dL   BUN 44 (H) 6 - 20 mg/dL   Creatinine, Ser 1.77 (H) 0.61 - 1.24 mg/dL   Calcium 8.9 8.9 - 10.3 mg/dL   GFR calc non Af Amer 34 (L) >60 mL/min   GFR calc Af Amer 40 (L) >60 mL/min    Comment: (NOTE) The eGFR has been calculated using the CKD EPI equation. This calculation has not been validated in all clinical situations. eGFR's persistently <60 mL/min signify possible Chronic Kidney Disease.    Anion gap 10 5 - 15  CBC with Differential/Platelet      Status: Abnormal   Collection Time: 09/13/16  4:46 AM  Result Value Ref Range   WBC 13.0 (H) 4.0 - 10.5 K/uL   RBC 4.67 4.22 - 5.81 MIL/uL   Hemoglobin 14.7 13.0 - 17.0 g/dL   HCT 45.4 39.0 - 52.0 %   MCV 97.2 78.0 - 100.0 fL   MCH 31.5 26.0 - 34.0 pg   MCHC 32.4 30.0 - 36.0 g/dL   RDW 15.4 11.5 - 15.5 %   Platelets 255 150 - 400 K/uL   Neutrophils Relative % 90 %   Neutro Abs 11.7 (H) 1.7 - 7.7 K/uL   Lymphocytes Relative 4 %   Lymphs Abs 0.6 (L) 0.7 - 4.0 K/uL   Monocytes Relative 6 %   Monocytes Absolute 0.7 0.1 - 1.0 K/uL   Eosinophils Relative 0 %   Eosinophils Absolute 0.0 0.0 - 0.7 K/uL   Basophils Relative 0 %   Basophils Absolute 0.0 0.0 - 0.1 K/uL  Glucose, capillary     Status: Abnormal   Collection Time: 09/13/16  7:29 PM  Result Value Ref Range   Glucose-Capillary 163 (H) 65 - 99 mg/dL   Comment 1 Notify RN   Glucose, capillary     Status: Abnormal   Collection Time: 09/13/16 11:33 PM  Result Value Ref Range   Glucose-Capillary 155 (H) 65 - 99 mg/dL  Comment 1 Notify RN   Glucose, capillary     Status: Abnormal   Collection Time: 09/14/16  3:52 AM  Result Value Ref Range   Glucose-Capillary 156 (H) 65 - 99 mg/dL   Comment 1 Notify RN   Basic metabolic panel     Status: Abnormal   Collection Time: 09/14/16  4:56 AM  Result Value Ref Range   Sodium 146 (H) 135 - 145 mmol/L   Potassium 3.7 3.5 - 5.1 mmol/L   Chloride 108 101 - 111 mmol/L   CO2 27 22 - 32 mmol/L   Glucose, Bld 162 (H) 65 - 99 mg/dL   BUN 56 (H) 6 - 20 mg/dL   Creatinine, Ser 1.85 (H) 0.61 - 1.24 mg/dL   Calcium 9.3 8.9 - 10.3 mg/dL   GFR calc non Af Amer 33 (L) >60 mL/min   GFR calc Af Amer 38 (L) >60 mL/min    Comment: (NOTE) The eGFR has been calculated using the CKD EPI equation. This calculation has not been validated in all clinical situations. eGFR's persistently <60 mL/min signify possible Chronic Kidney Disease.    Anion gap 11 5 - 15  Glucose, capillary     Status: Abnormal    Collection Time: 09/14/16  7:23 AM  Result Value Ref Range   Glucose-Capillary 153 (H) 65 - 99 mg/dL    ABGS No results for input(s): PHART, PO2ART, TCO2, HCO3 in the last 72 hours.  Invalid input(s): PCO2 CULTURES Recent Results (from the past 240 hour(s))  Blood Culture (routine x 2)     Status: None (Preliminary result)   Collection Time: 09/09/2016 12:28 PM  Result Value Ref Range Status   Specimen Description BLOOD RIGHT ARM DRAWN BY RN  Final   Special Requests   Final    Blood Culture results may not be optimal due to an inadequate volume of blood received in culture bottles   Culture NO GROWTH 3 DAYS  Final   Report Status PENDING  Incomplete  Urine culture     Status: None   Collection Time: 08/26/2016 12:29 PM  Result Value Ref Range Status   Specimen Description URINE, CATHETERIZED  Final   Special Requests Normal  Final   Culture   Final    NO GROWTH Performed at [facility], [place] [address] [place], [place] [address]    Report Status 09/12/2016 FINAL  Final  Blood Culture (routine x 2)     Status: None (Preliminary result)   Collection Time: 09/12/2016 12:51 PM  Result Value Ref Range Status   Specimen Description BLOOD LEFT ARM  Final   Special Requests   Final    BOTTLES DRAWN AEROBIC AND ANAEROBIC Blood Culture adequate volume   Culture NO GROWTH 3 DAYS  Final   Report Status PENDING  Incomplete  MRSA PCR Screening     Status: None   Collection Time: 09/09/2016  5:00 PM  Result Value Ref Range Status   MRSA by PCR NEGATIVE NEGATIVE Final    Comment:        The GeneXpert MRSA Assay (FDA approved for NASAL specimens only), is one component of a comprehensive MRSA colonization surveillance program. It is not intended to diagnose MRSA infection nor to guide or monitor treatment for MRSA infections.    Studies/Results: Dg Chest Port 1 View  Result Date: 09/14/2016 CLINICAL DATA:  83 year old with respiratory failure and worsening chest congestion.  Current history of dementia, hypertension, diabetes and COPD. Former smoker.  Prior MI. Stage 3 chronic kidney disease. EXAM: PORTABLE CHEST 1 VIEW COMPARISON:  09/12/2016, 08/20/2016 and earlier. FINDINGS: Interval development of severe diffuse interstitial and airspace pulmonary edema, the airspace component in a perihilar distribution. Interval increase in size of bilateral pleural effusions, associated with consolidation in the lower lobes. Cardiac silhouette mildly enlarged, unchanged. IMPRESSION: 1. Severe acute CHF, with severe diffuse interstitial and airspace pulmonary edema. 2. Enlarging bilateral pleural effusions with associated dense passive atelectasis in the lower lobes. Electronically Signed   By: Evangeline Dakin M.D.   On: 09/14/2016 08:08    Medications:  Prior to Admission:  Prescriptions Prior to Admission  Medication Sig Dispense Refill Last Dose  . ALPRAZolam (XANAX) 1 MG tablet Take one tablet by mouth in the morning and two tablets at bedtime for anxiety/rest (Patient taking differently: Take 1-2 mg by mouth 2 (two) times daily. Take one tablet by mouth in the morning and two tablets at bedtime for anxiety/rest) 90 tablet 5 08/28/2016 at Unknown time  . aspirin 81 MG EC tablet Take 81 mg by mouth daily.     09/07/2016 at Unknown time  . calcium carbonate (ANTACID CALCIUM) 500 MG chewable tablet Chew 2 tablets by mouth 3 (three) times daily with meals.   09/01/2016 at Unknown time  . cloNIDine (CATAPRES) 0.1 MG tablet Take 0.1 mg by mouth 2 (two) times daily.    09/04/2016 at Unknown time  . famotidine (PEPCID) 20 MG tablet Take 20 mg by mouth daily.   09/03/2016 at Unknown time  . ferrous sulfate 325 (65 FE) MG tablet Take 1 tablet (325 mg total) by mouth 2 (two) times daily with a meal.  3 08/25/2016 at Unknown time  . glipiZIDE (GLUCOTROL) 5 MG tablet Take 5 mg by mouth daily.    08/27/2016 at Unknown time  . levothyroxine (SYNTHROID, LEVOTHROID) 112 MCG tablet Take 112 mcg by mouth  daily before breakfast.   08/31/2016 at Unknown time  . losartan (COZAAR) 50 MG tablet Take 50 mg by mouth daily.   10 08/13/2016 at Unknown time  . Melatonin 3 MG CAPS Take 6 mg by mouth at bedtime.   09/09/2016 at Unknown time  . memantine (NAMENDA) 10 MG tablet Take 10 mg by mouth 2 (two) times daily.  5 09/02/2016 at Unknown time  . metFORMIN (GLUCOPHAGE) 500 MG tablet Take 500 mg by mouth daily.   08/28/2016 at Unknown time  . omeprazole (PRILOSEC) 40 MG capsule Take 40 mg by mouth daily.   08/18/2016 at Unknown time  . tamsulosin (FLOMAX) 0.4 MG CAPS capsule Take 1 capsule (0.4 mg total) by mouth at bedtime. 30 capsule 0 09/09/2016 at Unknown time  . albuterol (PROVENTIL HFA;VENTOLIN HFA) 108 (90 Base) MCG/ACT inhaler Inhale 1-2 puffs into the lungs every 6 (six) hours as needed for wheezing or shortness of breath. 1 Inhaler 0 unknown  . dextromethorphan-guaiFENesin (TUSSIN DM) 10-100 MG/5ML liquid Take 10 mLs by mouth every 4 (four) hours as needed for cough.   unknown  . nitroGLYCERIN (NITROSTAT) 0.4 MG SL tablet Place 0.4 mg under the tongue every 5 (five) minutes as needed for chest pain.    unknown  . senna (SENOKOT) 8.6 MG TABS tablet Take 1 tablet by mouth daily as needed for mild constipation.   unknown   Scheduled: . acetylcysteine  3 mL Nebulization QID  . chlorhexidine  15 mL Mouth Rinse BID  . enoxaparin (LOVENOX) injection  40 mg Subcutaneous Q24H  . feeding supplement (ENSURE  ENLIVE)  237 mL Oral BID BM  . ipratropium-albuterol  3 mL Nebulization Q4H  . levothyroxine  50 mcg Intravenous Daily  . mouth rinse  15 mL Mouth Rinse q12n4p  . methylPREDNISolone (SOLU-MEDROL) injection  40 mg Intravenous Q12H  . pantoprazole (PROTONIX) IV  40 mg Intravenous Q24H  . sodium chloride flush  3 mL Intravenous Q12H   Continuous: . sodium chloride 1 mL (09/13/16 0909)  . piperacillin-tazobactam (ZOSYN)  IV 3.375 g (09/14/16 0539)   CHE:[id number] **OR** acetaminophen, albuterol,  LORazepam, ondansetron **OR** ondansetron (ZOFRAN) IV  Assesment: He has been admitted with sepsis presumably from urinary tract infection. He appears to have aspirated. He has acute hypoxic respiratory failure and he is still on oxygen. He has COPD at baseline. He has acute encephalopathy related to his sepsis and that's a little better but he is still sleepy. Chest x-ray that I personally reviewed today looks like pulmonary edema He's having some trouble swallowing so he is nothing by mouth for now. Principal Problem:   Sepsis ([place]) Active Problems:   Chronic kidney disease, stage 3   Acute encephalopathy   Lower urinary tract infectious disease   COPD (chronic obstructive pulmonary disease) (HCC)   Acute respiratory failure with hypoxia (HCC)   Diabetes mellitus with nephropathy ([place])    Plan: Continue Lasix. More potassium replacement IV since he had trouble with pills. Check comprehensive metabolic profile in the morning. Check chest x-ray again in the morning.    LOS: 4 days   Godwin Tedesco L 09/14/2016, 9:15 AM

## 2016-09-14 NOTE — Evaluation (Signed)
Clinical/Bedside Swallow Evaluation Patient Details  Name: [name] MRN: [id number] Date of Birth: 1933-10-11  Today's Date: 09/14/2016 Time: SLP Start Time (ACUTE ONLY): 5427 SLP Stop Time (ACUTE ONLY): 1210 SLP Time Calculation (min) (ACUTE ONLY): 28 min  Past Medical History:  Past Medical History:  Diagnosis Date  . Alcohol abuse, in remission   . Anemia   . ASCVD (arteriosclerotic cardiovascular disease)     Inferior myocardial infarction in 08/1998 requiring PCI of the RCA; moderate residual disease in the left anterior descending and first diagonal; ejection fraction of 45%  . Cerebrovascular disease     Right carotid bruit; plaque without stenosis in 2003 and 2005  . COPD (chronic obstructive pulmonary disease) ([place])   . Dementia   . Diabetes mellitus    A1c-7.7 in 2004 with diet-controlled; 6.3 and 11/08 on oral medication  . Elevated PSA    prior negative prostate biopsy  . Hyperlipidemia   . Hypertension   . Hypothyroid   . MI, old   . Peripheral vascular disease ([place])   . Tobacco abuse    Consumption tapered to 2 packs per week  . Urinary retention    07/2014   Past Surgical History:  Past Surgical History:  Procedure Laterality Date  . COLONOSCOPY  06/2010   Dr. Laural Golden  . DENTAL SURGERY     Right jaw; continued chronic pain  . NASAL SINUS SURGERY    . stents     cardiac stent  . ULNAR NERVE REPAIR     right arm   HPI:  83 year old man PMH dementia, COPD, diabetes was sent to the emergency department for reported fever. Further evaluation revealed UTI, sepsis and the patient was admitted to the ICU. Pt recently seen by GI (outpatient, [facility]) for dysphagia. An esophagram was completed this admission and was essentially unremarkable and appeared to be a limited study. Esophagram completed several years ago showed esophageal dysmotility and pharyngeal pooling in valleculae and pyriforms. Pt c/o excessive saliva production following a dental procedure  several years ago. BSE ordered this admission due to RN noting difficulty swallowing water yesterday. Pt is a resident at [facility]. Most recent chest x-ray shows: Severe acute CHF and enlarging bilateral pleural effusions with associated dense passive atelectasis in the lower lobes.   Assessment / Plan / Recommendation Clinical Impression  Pt assessed clinically at bedside with stepson present. Pt required max verbal and tactile cues for alertness and attention to task. Pt unable to produce volitional swallow without po and required max cues for cough (which was weak and congested). Pt allowed oral care. Limited po trials presented due to lethargy, weak cough, fluctuating alertness, and signs of decreased airway protection. Recommend continued NPO with oral care and ok for single ice chips presented by RN after oral care. Above to family and Conservation officer, historic buildings. SLP to check back tomorrow. NPO sign placed at [facility].  SLP Visit Diagnosis: Dysphagia, oropharyngeal phase (R13.12)    Aspiration Risk  Moderate aspiration risk;Risk for inadequate nutrition/hydration    Diet Recommendation NPO;Alternative means - temporary;Ice chips PRN after oral care   Medication Administration: Via alternative means    Other  Recommendations Oral Care Recommendations: Oral care prior to ice chip/H20;Staff/trained caregiver to provide oral care;Oral care QID Other Recommendations: Have oral suction available   Follow up Recommendations Skilled Nursing facility      Frequency and Duration min 2x/week  1 week       Prognosis Prognosis for Safe Diet  Advancement: Fair Barriers to Reach Goals: Other (Comment) (lethargy)      Swallow Study   General Date of Onset: 08/27/2016 HPI: 83 year old man PMH dementia, COPD, diabetes was sent to the emergency department for reported fever. Further evaluation revealed UTI, sepsis and the patient was admitted to the ICU. Pt recently seen by GI (outpatient, [facility]) for dysphagia.  An esophagram was completed this admission and was essentially unremarkable and appeared to be a limited study. Esophagram completed several years ago showed esophageal dysmotility and pharyngeal pooling in valleculae and pyriforms. Pt c/o excessive saliva production following a dental procedure several years ago. BSE ordered this admission due to RN noting difficulty swallowing water yesterday. Pt is a resident at [facility]. Most recent chest x-ray shows: Severe acute CHF and enlarging bilateral pleural effusions with associated dense passive atelectasis in the lower lobes. Type of Study: Bedside Swallow Evaluation Previous Swallow Assessment: no SLP records, has had BaSw Diet Prior to this Study: NPO Temperature Spikes Noted: No Respiratory Status: Nasal cannula History of Recent Intubation: No Behavior/Cognition: Lethargic/Drowsy Oral Cavity Assessment: Within Functional Limits Oral Care Completed by SLP: Yes Oral Cavity - Dentition: Missing dentition Vision: Impaired for self-feeding Self-Feeding Abilities: Total assist Patient Positioning: Upright in bed Baseline Vocal Quality: Low vocal intensity Volitional Cough: Congested;Weak Volitional Swallow: Unable to elicit    Oral/Motor/Sensory Function Overall Oral Motor/Sensory Function: Generalized oral weakness   Ice Chips Ice chips: Impaired Presentation: Spoon Oral Phase Impairments: Reduced labial seal;Reduced lingual movement/coordination Oral Phase Functional Implications: Prolonged oral transit Pharyngeal Phase Impairments: Suspected delayed Swallow;Throat Clearing - Delayed   Thin Liquid Thin Liquid: Impaired Presentation: Spoon Oral Phase Impairments: Reduced lingual movement/coordination Pharyngeal  Phase Impairments: Suspected delayed Swallow;Wet Vocal Quality;Throat Clearing - Delayed;Cough - Delayed;Change in Vital Signs (O2 sats drop to 82, however unsure if accurate)    Nectar Thick Nectar Thick Liquid: Not tested    Honey Thick Honey Thick Liquid: Not tested   Puree Puree: Not tested   Solid   Thank you,  Genene Churn, CCC-SLP [phone]    Solid: Not tested        Deklen Popelka 09/14/2016,12:35 PM

## 2016-09-14 NOTE — Progress Notes (Signed)
Patient saturations have been trending low. He has dementia He is on 15 lpm High flow nasal cannula. He has rhonchi through out but not crackles. Suspect his volume problem is improving. Still has aspiration pneumonia. Saturation 88-91

## 2016-09-14 NOTE — Progress Notes (Signed)
Subjective: Patient is sleepy. Patient is currently NPO due to aspirtion reisk. Speech evaluation is requested.  Objective: Vital signs in last 24 hours: Temp:  [97.3 F (36.3 C)-98.9 F (37.2 C)] 97.3 F (36.3 C) (09/02 0724) Pulse Rate:  [95-148] 116 (09/02 0724) Resp:  [22-37] 26 (09/02 0724) BP: (93-140)/(55-128) 125/77 (09/02 0600) SpO2:  [79 %-95 %] 90 % (09/02 0724) Weight:  [81.1 kg (178 lb 12.7 oz)] 81.1 kg (178 lb 12.7 oz) (09/02 0500) Weight change: -3.1 kg (-6 lb 13.4 oz) Last BM Date: 09/13/16  Intake/Output from previous day: 09/01 0701 - 09/02 0700 In: 1378.8 [I.V.:1278.8; IV Piggyback:100] Out: 4602 [Urine:4600; Stool:2]  PHYSICAL EXAM General appearance: delirious and no distress Resp: diminished breath sounds bilaterally and rhonchi bilaterally Cardio: S1, S2 normal GI: soft, non-tender; bowel sounds normal; no masses,  no organomegaly Extremities: extremities normal, atraumatic, no cyanosis or edema  Lab Results:  Results for orders placed or performed during the hospital encounter of 08/23/2016 (from the past 48 hour(s))  Basic metabolic panel     Status: Abnormal   Collection Time: 09/13/16  4:46 AM  Result Value Ref Range   Sodium 146 (H) 135 - 145 mmol/L   Potassium 3.8 3.5 - 5.1 mmol/L   Chloride 111 101 - 111 mmol/L   CO2 25 22 - 32 mmol/L   Glucose, Bld 159 (H) 65 - 99 mg/dL   BUN 44 (H) 6 - 20 mg/dL   Creatinine, Ser 1.77 (H) 0.61 - 1.24 mg/dL   Calcium 8.9 8.9 - 10.3 mg/dL   GFR calc non Af Amer 34 (L) >60 mL/min   GFR calc Af Amer 40 (L) >60 mL/min    Comment: (NOTE) The eGFR has been calculated using the CKD EPI equation. This calculation has not been validated in all clinical situations. eGFR's persistently <60 mL/min signify possible Chronic Kidney Disease.    Anion gap 10 5 - 15  CBC with Differential/Platelet     Status: Abnormal   Collection Time: 09/13/16  4:46 AM  Result Value Ref Range   WBC 13.0 (H) 4.0 - 10.5 K/uL   RBC  4.67 4.22 - 5.81 MIL/uL   Hemoglobin 14.7 13.0 - 17.0 g/dL   HCT 45.4 39.0 - 52.0 %   MCV 97.2 78.0 - 100.0 fL   MCH 31.5 26.0 - 34.0 pg   MCHC 32.4 30.0 - 36.0 g/dL   RDW 15.4 11.5 - 15.5 %   Platelets 255 150 - 400 K/uL   Neutrophils Relative % 90 %   Neutro Abs 11.7 (H) 1.7 - 7.7 K/uL   Lymphocytes Relative 4 %   Lymphs Abs 0.6 (L) 0.7 - 4.0 K/uL   Monocytes Relative 6 %   Monocytes Absolute 0.7 0.1 - 1.0 K/uL   Eosinophils Relative 0 %   Eosinophils Absolute 0.0 0.0 - 0.7 K/uL   Basophils Relative 0 %   Basophils Absolute 0.0 0.0 - 0.1 K/uL  Glucose, capillary     Status: Abnormal   Collection Time: 09/13/16  7:29 PM  Result Value Ref Range   Glucose-Capillary 163 (H) 65 - 99 mg/dL   Comment 1 Notify RN   Glucose, capillary     Status: Abnormal   Collection Time: 09/13/16 11:33 PM  Result Value Ref Range   Glucose-Capillary 155 (H) 65 - 99 mg/dL   Comment 1 Notify RN   Glucose, capillary     Status: Abnormal   Collection Time: 09/14/16  3:52 AM  Result  Value Ref Range   Glucose-Capillary 156 (H) 65 - 99 mg/dL   Comment 1 Notify RN   Basic metabolic panel     Status: Abnormal   Collection Time: 09/14/16  4:56 AM  Result Value Ref Range   Sodium 146 (H) 135 - 145 mmol/L   Potassium 3.7 3.5 - 5.1 mmol/L   Chloride 108 101 - 111 mmol/L   CO2 27 22 - 32 mmol/L   Glucose, Bld 162 (H) 65 - 99 mg/dL   BUN 56 (H) 6 - 20 mg/dL   Creatinine, Ser 1.85 (H) 0.61 - 1.24 mg/dL   Calcium 9.3 8.9 - 10.3 mg/dL   GFR calc non Af Amer 33 (L) >60 mL/min   GFR calc Af Amer 38 (L) >60 mL/min    Comment: (NOTE) The eGFR has been calculated using the CKD EPI equation. This calculation has not been validated in all clinical situations. eGFR's persistently <60 mL/min signify possible Chronic Kidney Disease.    Anion gap 11 5 - 15  Glucose, capillary     Status: Abnormal   Collection Time: 09/14/16  7:23 AM  Result Value Ref Range   Glucose-Capillary 153 (H) 65 - 99 mg/dL     ABGS No results for input(s): PHART, PO2ART, TCO2, HCO3 in the last 72 hours.  Invalid input(s): PCO2 CULTURES Recent Results (from the past 240 hour(s))  Blood Culture (routine x 2)     Status: None (Preliminary result)   Collection Time: 08/14/2016 12:28 PM  Result Value Ref Range Status   Specimen Description BLOOD RIGHT ARM DRAWN BY RN  Final   Special Requests   Final    Blood Culture results may not be optimal due to an inadequate volume of blood received in culture bottles   Culture NO GROWTH 3 DAYS  Final   Report Status PENDING  Incomplete  Urine culture     Status: None   Collection Time: 08/29/2016 12:29 PM  Result Value Ref Range Status   Specimen Description URINE, CATHETERIZED  Final   Special Requests Normal  Final   Culture   Final    NO GROWTH Performed at [facility], [place] [address] [place], [place] [address]    Report Status 09/12/2016 FINAL  Final  Blood Culture (routine x 2)     Status: None (Preliminary result)   Collection Time: 08/27/2016 12:51 PM  Result Value Ref Range Status   Specimen Description BLOOD LEFT ARM  Final   Special Requests   Final    BOTTLES DRAWN AEROBIC AND ANAEROBIC Blood Culture adequate volume   Culture NO GROWTH 3 DAYS  Final   Report Status PENDING  Incomplete  MRSA PCR Screening     Status: None   Collection Time: 08/28/2016  5:00 PM  Result Value Ref Range Status   MRSA by PCR NEGATIVE NEGATIVE Final    Comment:        The GeneXpert MRSA Assay (FDA approved for NASAL specimens only), is one component of a comprehensive MRSA colonization surveillance program. It is not intended to diagnose MRSA infection nor to guide or monitor treatment for MRSA infections.    Studies/Results: Dg Chest Port 1 View  Result Date: 09/14/2016 CLINICAL DATA:  83 year old with respiratory failure and worsening chest congestion. Current history of dementia, hypertension, diabetes and COPD. Former smoker. Prior MI. Stage 3 chronic  kidney disease. EXAM: PORTABLE CHEST 1 VIEW COMPARISON:  09/12/2016, 08/14/2016 and earlier. FINDINGS: Interval development of severe diffuse  interstitial and airspace pulmonary edema, the airspace component in a perihilar distribution. Interval increase in size of bilateral pleural effusions, associated with consolidation in the lower lobes. Cardiac silhouette mildly enlarged, unchanged. IMPRESSION: 1. Severe acute CHF, with severe diffuse interstitial and airspace pulmonary edema. 2. Enlarging bilateral pleural effusions with associated dense passive atelectasis in the lower lobes. Electronically Signed   By: Evangeline Dakin M.D.   On: 09/14/2016 08:08    Medications: I have reviewed the patient's current medications.  Assesment:  Principal Problem:   Sepsis ([place]) Active Problems:   Chronic kidney disease, stage 3   Acute encephalopathy   Lower urinary tract infectious disease   COPD (chronic obstructive pulmonary disease) ([facility])   Acute respiratory failure with hypoxia (H[facility])   Diabetes mellitus with nephropathy ([place])    Plan:  Medications reviewed Continue IV antibiotics and IV steroid Will change IV  Fluid to D5 half normal at 75 cc/hr Pulmonary consult appreciated Will do CBC/BMP    LOS: 4 days   Federica Allport 09/14/2016, 9:20 AM

## 2016-09-14 NOTE — Progress Notes (Signed)
Patient Saturation in 70's , patient on high flow nasal cannula. High flow nasal cannula not giving any oxygen to patient because of being cross threaded when being refilled. ( High Flow oxygen is easily cross threaded and is safety hazard , this particular model. ) Patient saturation has increased to 91 on 15 lpm.

## 2016-09-15 ENCOUNTER — Inpatient Hospital Stay (HOSPITAL_COMMUNITY): Payer: Medicare Other

## 2016-09-15 LAB — CULTURE, BLOOD (ROUTINE X 2)
CULTURE: NO GROWTH
Culture: NO GROWTH
Special Requests: ADEQUATE

## 2016-09-15 LAB — COMPREHENSIVE METABOLIC PANEL
ALT: 18 U/L (ref 17–63)
AST: 21 U/L (ref 15–41)
Albumin: 2.8 g/dL — ABNORMAL LOW (ref 3.5–5.0)
Alkaline Phosphatase: 46 U/L (ref 38–126)
Anion gap: 11 (ref 5–15)
BUN: 58 mg/dL — ABNORMAL HIGH (ref 6–20)
CHLORIDE: 99 mmol/L — AB (ref 101–111)
CO2: 35 mmol/L — ABNORMAL HIGH (ref 22–32)
Calcium: 9.5 mg/dL (ref 8.9–10.3)
Creatinine, Ser: 1.77 mg/dL — ABNORMAL HIGH (ref 0.61–1.24)
GFR, EST AFRICAN AMERICAN: 40 mL/min — AB (ref >60)
GFR, EST NON AFRICAN AMERICAN: 34 mL/min — AB (ref >60)
Glucose, Bld: 249 mg/dL — ABNORMAL HIGH (ref 65–99)
POTASSIUM: 3.5 mmol/L (ref 3.5–5.1)
Sodium: 145 mmol/L (ref 135–145)
Total Bilirubin: 0.7 mg/dL (ref 0.3–1.2)
Total Protein: 6.6 g/dL (ref 6.5–8.1)

## 2016-09-15 LAB — GLUCOSE, CAPILLARY
GLUCOSE-CAPILLARY: 244 mg/dL — AB (ref 65–99)
GLUCOSE-CAPILLARY: 244 mg/dL — AB (ref 65–99)

## 2016-09-15 NOTE — Progress Notes (Signed)
Subjective: He is overall I think a little bit better. He is more alert to my exam today. Speech evaluation noted  Objective: Vital signs in last 24 hours: Temp:  [97.4 F (36.3 C)-98.7 F (37.1 C)] 97.4 F (36.3 C) (09/03 0800) Pulse Rate:  [92-140] 104 (09/03 0800) Resp:  [18-32] 32 (09/03 0800) BP: (115-141)/(54-82) 119/54 (09/03 0800) SpO2:  [75 %-97 %] 89 % (09/03 0806) Weight:  [75.6 kg (166 lb 10.7 oz)] 75.6 kg (166 lb 10.7 oz) (09/03 0500) Weight change: -5.5 kg (-12 lb 2 oz) Last BM Date: 09/15/16  Intake/Output from previous day: 09/02 0701 - 09/03 0700 In: 1595 [I.V.:1395; IV Piggyback:200] Out: 5625 [Urine:5775; Stool:1]  PHYSICAL EXAM General appearance: Sleepy but more alert than yesterday. Resp: rhonchi bilaterally Cardio: Heart is more regular today GI: soft, non-tender; bowel sounds normal; no masses,  no organomegaly Extremities: extremities normal, atraumatic, no cyanosis or edema Poor gag reflex  Lab Results:  Results for orders placed or performed during the hospital encounter of 09/02/2016 (from the past 48 hour(s))  Glucose, capillary     Status: Abnormal   Collection Time: 09/13/16  7:29 PM  Result Value Ref Range   Glucose-Capillary 163 (H) 65 - 99 mg/dL   Comment 1 Notify RN   Glucose, capillary     Status: Abnormal   Collection Time: 09/13/16 11:33 PM  Result Value Ref Range   Glucose-Capillary 155 (H) 65 - 99 mg/dL   Comment 1 Notify RN   Glucose, capillary     Status: Abnormal   Collection Time: 09/14/16  3:52 AM  Result Value Ref Range   Glucose-Capillary 156 (H) 65 - 99 mg/dL   Comment 1 Notify RN   Basic metabolic panel     Status: Abnormal   Collection Time: 09/14/16  4:56 AM  Result Value Ref Range   Sodium 146 (H) 135 - 145 mmol/L   Potassium 3.7 3.5 - 5.1 mmol/L   Chloride 108 101 - 111 mmol/L   CO2 27 22 - 32 mmol/L   Glucose, Bld 162 (H) 65 - 99 mg/dL   BUN 56 (H) 6 - 20 mg/dL   Creatinine, Ser 1.85 (H) 0.61 - 1.24 mg/dL   Calcium 9.3 8.9 - 10.3 mg/dL   GFR calc non Af Amer 33 (L) >60 mL/min   GFR calc Af Amer 38 (L) >60 mL/min    Comment: (NOTE) The eGFR has been calculated using the CKD EPI equation. This calculation has not been validated in all clinical situations. eGFR's persistently <60 mL/min signify possible Chronic Kidney Disease.    Anion gap 11 5 - 15  Glucose, capillary     Status: Abnormal   Collection Time: 09/14/16  7:23 AM  Result Value Ref Range   Glucose-Capillary 153 (H) 65 - 99 mg/dL  Glucose, capillary     Status: Abnormal   Collection Time: 09/14/16 11:15 AM  Result Value Ref Range   Glucose-Capillary 173 (H) 65 - 99 mg/dL  Glucose, capillary     Status: Abnormal   Collection Time: 09/14/16  4:30 PM  Result Value Ref Range   Glucose-Capillary 211 (H) 65 - 99 mg/dL  Glucose, capillary     Status: Abnormal   Collection Time: 09/14/16  7:29 PM  Result Value Ref Range   Glucose-Capillary 236 (H) 65 - 99 mg/dL   Comment 1 Notify RN   Glucose, capillary     Status: Abnormal   Collection Time: 09/14/16 11:27 PM  Result Value  Ref Range   Glucose-Capillary 278 (H) 65 - 99 mg/dL   Comment 1 Notify RN   Comprehensive metabolic panel     Status: Abnormal   Collection Time: 09/15/16  4:38 AM  Result Value Ref Range   Sodium 145 135 - 145 mmol/L   Potassium 3.5 3.5 - 5.1 mmol/L   Chloride 99 (L) 101 - 111 mmol/L   CO2 35 (H) 22 - 32 mmol/L   Glucose, Bld 249 (H) 65 - 99 mg/dL   BUN 58 (H) 6 - 20 mg/dL   Creatinine, Ser 1.77 (H) 0.61 - 1.24 mg/dL   Calcium 9.5 8.9 - 10.3 mg/dL   Total Protein 6.6 6.5 - 8.1 g/dL   Albumin 2.8 (L) 3.5 - 5.0 g/dL   AST 21 15 - 41 U/L   ALT 18 17 - 63 U/L   Alkaline Phosphatase 46 38 - 126 U/L   Total Bilirubin 0.7 0.3 - 1.2 mg/dL   GFR calc non Af Amer 34 (L) >60 mL/min   GFR calc Af Amer 40 (L) >60 mL/min    Comment: (NOTE) The eGFR has been calculated using the CKD EPI equation. This calculation has not been validated in all clinical  situations. eGFR's persistently <60 mL/min signify possible Chronic Kidney Disease.    Anion gap 11 5 - 15  Glucose, capillary     Status: Abnormal   Collection Time: 09/15/16  8:03 AM  Result Value Ref Range   Glucose-Capillary 244 (H) 65 - 99 mg/dL    ABGS No results for input(s): PHART, PO2ART, TCO2, HCO3 in the last 72 hours.  Invalid input(s): PCO2 CULTURES Recent Results (from the past 240 hour(s))  Blood Culture (routine x 2)     Status: None   Collection Time: 09/07/2016 12:28 PM  Result Value Ref Range Status   Specimen Description BLOOD RIGHT ARM DRAWN BY RN  Final   Special Requests   Final    Blood Culture results may not be optimal due to an inadequate volume of blood received in culture bottles   Culture NO GROWTH 5 DAYS  Final   Report Status 09/15/2016 FINAL  Final  Urine culture     Status: None   Collection Time: 09/03/2016 12:29 PM  Result Value Ref Range Status   Specimen Description URINE, CATHETERIZED  Final   Special Requests Normal  Final   Culture   Final    NO GROWTH Performed at [facility], [place] [address] [place], [place] [address]    Report Status 09/12/2016 FINAL  Final  Blood Culture (routine x 2)     Status: None   Collection Time: 08/25/2016 12:51 PM  Result Value Ref Range Status   Specimen Description BLOOD LEFT ARM  Final   Special Requests   Final    BOTTLES DRAWN AEROBIC AND ANAEROBIC Blood Culture adequate volume   Culture NO GROWTH 5 DAYS  Final   Report Status 09/15/2016 FINAL  Final  MRSA PCR Screening     Status: None   Collection Time: 08/25/2016  5:00 PM  Result Value Ref Range Status   MRSA by PCR NEGATIVE NEGATIVE Final    Comment:        The GeneXpert MRSA Assay (FDA approved for NASAL specimens only), is one component of a comprehensive MRSA colonization surveillance program. It is not intended to diagnose MRSA infection nor to guide or monitor treatment for MRSA infections.    Studies/Results: Dg Chest Port  1  View  Result Date: 09/15/2016 CLINICAL DATA:  Respiratory failure. Follow-up pulmonary edema and effusions. EXAM: PORTABLE CHEST 1 VIEW COMPARISON:  09/14/2016, 09/12/2016 and earlier. FINDINGS: Interval improvement in the interstitial and airspace pulmonary edema since yesterday, though moderate diffuse interstitial edema persists. Improved bilateral pleural effusions, though small effusions persist. Improved aeration in the right lower lobe, with mild passive atelectasis persisting. Stable dense consolidation in the left lower lobe. No new pulmonary parenchymal abnormalities. Cardiac silhouette enlarged but stable. IMPRESSION: 1. Improving pulmonary edema, though moderate interstitial edema persists. 2. Improving bilateral pleural effusions, though small effusions persist. 3. Improved aeration in the right lower lobe, with mild atelectasis persisting. Stable dense left lower lobe atelectasis and/or pneumonia (atelectasis favored). 4. No new abnormalities. Electronically Signed   By: Evangeline Dakin M.D.   On: 09/15/2016 08:19   Dg Chest Port 1 View  Result Date: 09/14/2016 CLINICAL DATA:  83 year old with respiratory failure and worsening chest congestion. Current history of dementia, hypertension, diabetes and COPD. Former smoker. Prior MI. Stage 3 chronic kidney disease. EXAM: PORTABLE CHEST 1 VIEW COMPARISON:  09/12/2016, 09/06/2016 and earlier. FINDINGS: Interval development of severe diffuse interstitial and airspace pulmonary edema, the airspace component in a perihilar distribution. Interval increase in size of bilateral pleural effusions, associated with consolidation in the lower lobes. Cardiac silhouette mildly enlarged, unchanged. IMPRESSION: 1. Severe acute CHF, with severe diffuse interstitial and airspace pulmonary edema. 2. Enlarging bilateral pleural effusions with associated dense passive atelectasis in the lower lobes. Electronically Signed   By: Evangeline Dakin M.D.   On: 09/14/2016  08:08    Medications:  Prior to Admission:  Prescriptions Prior to Admission  Medication Sig Dispense Refill Last Dose  . ALPRAZolam (XANAX) 1 MG tablet Take one tablet by mouth in the morning and two tablets at bedtime for anxiety/rest (Patient taking differently: Take 1-2 mg by mouth 2 (two) times daily. Take one tablet by mouth in the morning and two tablets at bedtime for anxiety/rest) 90 tablet 5 09/04/2016 at Unknown time  . aspirin 81 MG EC tablet Take 81 mg by mouth daily.     08/30/2016 at Unknown time  . calcium carbonate (ANTACID CALCIUM) 500 MG chewable tablet Chew 2 tablets by mouth 3 (three) times daily with meals.   08/13/2016 at Unknown time  . cloNIDine (CATAPRES) 0.1 MG tablet Take 0.1 mg by mouth 2 (two) times daily.    08/27/2016 at Unknown time  . famotidine (PEPCID) 20 MG tablet Take 20 mg by mouth daily.   09/02/2016 at Unknown time  . ferrous sulfate 325 (65 FE) MG tablet Take 1 tablet (325 mg total) by mouth 2 (two) times daily with a meal.  3 09/09/2016 at Unknown time  . glipiZIDE (GLUCOTROL) 5 MG tablet Take 5 mg by mouth daily.    09/12/2016 at Unknown time  . levothyroxine (SYNTHROID, LEVOTHROID) 112 MCG tablet Take 112 mcg by mouth daily before breakfast.   08/31/2016 at Unknown time  . losartan (COZAAR) 50 MG tablet Take 50 mg by mouth daily.   10 08/22/2016 at Unknown time  . Melatonin 3 MG CAPS Take 6 mg by mouth at bedtime.   09/09/2016 at Unknown time  . memantine (NAMENDA) 10 MG tablet Take 10 mg by mouth 2 (two) times daily.  5 08/25/2016 at Unknown time  . metFORMIN (GLUCOPHAGE) 500 MG tablet Take 500 mg by mouth daily.   09/01/2016 at Unknown time  . omeprazole (PRILOSEC) 40 MG capsule Take 40 mg by  mouth daily.   08/14/2016 at Unknown time  . tamsulosin (FLOMAX) 0.4 MG CAPS capsule Take 1 capsule (0.4 mg total) by mouth at bedtime. 30 capsule 0 09/09/2016 at Unknown time  . albuterol (PROVENTIL HFA;VENTOLIN HFA) 108 (90 Base) MCG/ACT inhaler Inhale 1-2 puffs into the  lungs every 6 (six) hours as needed for wheezing or shortness of breath. 1 Inhaler 0 unknown  . dextromethorphan-guaiFENesin (TUSSIN DM) 10-100 MG/5ML liquid Take 10 mLs by mouth every 4 (four) hours as needed for cough.   unknown  . nitroGLYCERIN (NITROSTAT) 0.4 MG SL tablet Place 0.4 mg under the tongue every 5 (five) minutes as needed for chest pain.    unknown  . senna (SENOKOT) 8.6 MG TABS tablet Take 1 tablet by mouth daily as needed for mild constipation.   unknown   Scheduled: . acetylcysteine  3 mL Nebulization QID  . chlorhexidine  15 mL Mouth Rinse BID  . enoxaparin (LOVENOX) injection  40 mg Subcutaneous Q24H  . feeding supplement (ENSURE ENLIVE)  237 mL Oral BID BM  . furosemide  40 mg Intravenous Q12H  . ipratropium-albuterol  3 mL Nebulization Q4H  . levothyroxine  50 mcg Intravenous Daily  . mouth rinse  15 mL Mouth Rinse q12n4p  . methylPREDNISolone (SOLU-MEDROL) injection  40 mg Intravenous Q12H  . pantoprazole (PROTONIX) IV  40 mg Intravenous Q24H  . sodium chloride flush  3 mL Intravenous Q12H   Continuous: . dextrose 5 % and 0.45% NaCl 75 mL/hr at 09/15/16 0551  . piperacillin-tazobactam (ZOSYN)  IV 3.375 g (09/15/16 0539)   TDV:[id number] **OR** acetaminophen, albuterol, LORazepam, ondansetron **OR** ondansetron (ZOFRAN) IV  Assesment: He was admitted with acute hypoxic respiratory failure with COPD exacerbation. He has acute encephalopathy and at baseline has dementia. He was septic on admission but that's better. He appears to have some volume overload likely from fluid resuscitation. He has had urinary tract infection which is being treated. He has what appears to be a aspiration pneumonia which is being treated. He is nothing by mouth because of his difficulty swallowing which I think is appropriate for now. Chest x-ray which I have personally reviewed looks better today. Principal Problem:   Sepsis ([place]) Active Problems:   Chronic kidney disease, stage 3    Acute encephalopathy   Lower urinary tract infectious disease   COPD (chronic obstructive pulmonary disease) (HCC)   Acute respiratory failure with hypoxia (HCC)   Diabetes mellitus with nephropathy ([place])    Plan: Continue current treatments. Nothing by mouth for now. Continue diuresis    LOS: 5 days   Addalee Kavanagh L 09/15/2016, 9:33 AM

## 2016-09-15 NOTE — Progress Notes (Signed)
Patient moving around in bed,  Saturation when correct is about 95 on high flow of 15 lpm. Still has rhonchi. Still confused but has dementia so base line is guess. Doesn't appear to be short of breath.

## 2016-09-15 NOTE — Progress Notes (Signed)
  Speech Language Pathology Treatment: Dysphagia  Patient Details Name: [name] MRN: [id number] DOB: 01-Apr-1933 Today's Date: 09/15/2016 Time: 3845-3646 SLP Time Calculation (min) (ACUTE ONLY): 25 min  Assessment / Plan / Recommendation Clinical Impression  Ongoing diagnostic dysphagia intervention completed at bedside. Pt demonstrating improved alertness today after mod/max verbal and tactile cues. Volitional cough continues to be weak, congested, and difficult to produce. Wet vocal quality and delayed throat clearing noted following ice chips and sips thin water. Pt with improved performance with puree and NTL via teaspoon presentations. Pt still likely to benefit from objective evaluation pending clinical course, however recommend beginning D1/puree with NTL via teaspoon or cup sips with 1:1 feeder assist only pt is alert and upright; po medications crushed as able in puree. SLP will continue to follow during acute stay. Above to RN.    HPI HPI: 83 year old man PMH dementia, COPD, diabetes was sent to the emergency department for reported fever. Further evaluation revealed UTI, sepsis and the patient was admitted to the ICU. Pt recently seen by GI (outpatient, [name]) for dysphagia. An esophagram was completed this admission and was essentially unremarkable and appeared to be a limited study. Esophagram completed several years ago showed esophageal dysmotility and pharyngeal pooling in valleculae and pyriforms. Pt c/o excessive saliva production following a dental procedure several years ago. BSE ordered this admission due to RN noting difficulty swallowing water yesterday. Pt is a resident at [facility]. Most recent chest x-ray shows: Severe acute CHF and enlarging bilateral pleural effusions with associated dense passive atelectasis in the lower lobes.      SLP Plan  Continue with current plan of care       Recommendations  Diet recommendations: Dysphagia 1  (puree);Nectar-thick liquid Liquids provided via: Cup;Teaspoon Medication Administration: Crushed with puree Supervision: Full supervision/cueing for compensatory strategies;Trained caregiver to feed patient Compensations: Slow rate;Small sips/bites;Clear throat intermittently Postural Changes and/or Swallow Maneuvers: Seated upright 90 degrees;Upright 30-60 min after meal                Oral Care Recommendations: Oral care BID;Staff/trained caregiver to provide oral care Follow up Recommendations: Skilled Nursing facility SLP Visit Diagnosis: Dysphagia, oropharyngeal phase (R13.12) Plan: Continue with current plan of care       Thank you,  [name], [place]                 [place] 09/15/2016, 4:25 PM

## 2016-09-15 NOTE — Progress Notes (Signed)
Subjective: Patient REMAINED AND CONFUSED. hE IS FOLLOWED BY PULMONOLOGIST. hE IS ON bipap AS NEEDED. Objective: Vital signs in last 24 hours: Temp:  [97.4 F (36.3 C)-98.7 F (37.1 C)] 97.4 F (36.3 C) (09/03 0800) Pulse Rate:  [92-140] 103 (09/03 0900) Resp:  [12-32] 12 (09/03 0900) BP: (115-141)/(54-82) 131/70 (09/03 0900) SpO2:  [75 %-97 %] 88 % (09/03 0900) Weight:  [75.6 kg (166 lb 10.7 oz)] 75.6 kg (166 lb 10.7 oz) (09/03 0500) Weight change: -5.5 kg (-12 lb 2 oz) Last BM Date: 09/15/16  Intake/Output from previous day: 09/02 0701 - 09/03 0700 In: 1595 [I.V.:1395; IV Piggyback:200] Out: 7017 [Urine:5775; Stool:1]  PHYSICAL EXAM General appearance: delirious and no distress Resp: diminished breath sounds bilaterally and rhonchi bilaterally Cardio: S1, S2 normal GI: soft, non-tender; bowel sounds normal; no masses,  no organomegaly Extremities: extremities normal, atraumatic, no cyanosis or edema  Lab Results:  Results for orders placed or performed during the hospital encounter of 08/18/2016 (from the past 48 hour(s))  Glucose, capillary     Status: Abnormal   Collection Time: 09/13/16  7:29 PM  Result Value Ref Range   Glucose-Capillary 163 (H) 65 - 99 mg/dL   Comment 1 Notify RN   Glucose, capillary     Status: Abnormal   Collection Time: 09/13/16 11:33 PM  Result Value Ref Range   Glucose-Capillary 155 (H) 65 - 99 mg/dL   Comment 1 Notify RN   Glucose, capillary     Status: Abnormal   Collection Time: 09/14/16  3:52 AM  Result Value Ref Range   Glucose-Capillary 156 (H) 65 - 99 mg/dL   Comment 1 Notify RN   Basic metabolic panel     Status: Abnormal   Collection Time: 09/14/16  4:56 AM  Result Value Ref Range   Sodium 146 (H) 135 - 145 mmol/L   Potassium 3.7 3.5 - 5.1 mmol/L   Chloride 108 101 - 111 mmol/L   CO2 27 22 - 32 mmol/L   Glucose, Bld 162 (H) 65 - 99 mg/dL   BUN 56 (H) 6 - 20 mg/dL   Creatinine, Ser 1.85 (H) 0.61 - 1.24 mg/dL   Calcium 9.3 8.9  - 10.3 mg/dL   GFR calc non Af Amer 33 (L) >60 mL/min   GFR calc Af Amer 38 (L) >60 mL/min    Comment: (NOTE) The eGFR has been calculated using the CKD EPI equation. This calculation has not been validated in all clinical situations. eGFR's persistently <60 mL/min signify possible Chronic Kidney Disease.    Anion gap 11 5 - 15  Glucose, capillary     Status: Abnormal   Collection Time: 09/14/16  7:23 AM  Result Value Ref Range   Glucose-Capillary 153 (H) 65 - 99 mg/dL  Glucose, capillary     Status: Abnormal   Collection Time: 09/14/16 11:15 AM  Result Value Ref Range   Glucose-Capillary 173 (H) 65 - 99 mg/dL  Glucose, capillary     Status: Abnormal   Collection Time: 09/14/16  4:30 PM  Result Value Ref Range   Glucose-Capillary 211 (H) 65 - 99 mg/dL  Glucose, capillary     Status: Abnormal   Collection Time: 09/14/16  7:29 PM  Result Value Ref Range   Glucose-Capillary 236 (H) 65 - 99 mg/dL   Comment 1 Notify RN   Glucose, capillary     Status: Abnormal   Collection Time: 09/14/16 11:27 PM  Result Value Ref Range   Glucose-Capillary 278 (H)  65 - 99 mg/dL   Comment 1 Notify RN   Comprehensive metabolic panel     Status: Abnormal   Collection Time: 09/15/16  4:38 AM  Result Value Ref Range   Sodium 145 135 - 145 mmol/L   Potassium 3.5 3.5 - 5.1 mmol/L   Chloride 99 (L) 101 - 111 mmol/L   CO2 35 (H) 22 - 32 mmol/L   Glucose, Bld 249 (H) 65 - 99 mg/dL   BUN 58 (H) 6 - 20 mg/dL   Creatinine, Ser 1.77 (H) 0.61 - 1.24 mg/dL   Calcium 9.5 8.9 - 10.3 mg/dL   Total Protein 6.6 6.5 - 8.1 g/dL   Albumin 2.8 (L) 3.5 - 5.0 g/dL   AST 21 15 - 41 U/L   ALT 18 17 - 63 U/L   Alkaline Phosphatase 46 38 - 126 U/L   Total Bilirubin 0.7 0.3 - 1.2 mg/dL   GFR calc non Af Amer 34 (L) >60 mL/min   GFR calc Af Amer 40 (L) >60 mL/min    Comment: (NOTE) The eGFR has been calculated using the CKD EPI equation. This calculation has not been validated in all clinical situations. eGFR's  persistently <60 mL/min signify possible Chronic Kidney Disease.    Anion gap 11 5 - 15  Glucose, capillary     Status: Abnormal   Collection Time: 09/15/16  8:03 AM  Result Value Ref Range   Glucose-Capillary 244 (H) 65 - 99 mg/dL    ABGS No results for input(s): PHART, PO2ART, TCO2, HCO3 in the last 72 hours.  Invalid input(s): PCO2 CULTURES Recent Results (from the past 240 hour(s))  Blood Culture (routine x 2)     Status: None   Collection Time: 09/07/2016 12:28 PM  Result Value Ref Range Status   Specimen Description BLOOD RIGHT ARM DRAWN BY RN  Final   Special Requests   Final    Blood Culture results may not be optimal due to an inadequate volume of blood received in culture bottles   Culture NO GROWTH 5 DAYS  Final   Report Status 09/15/2016 FINAL  Final  Urine culture     Status: None   Collection Time: 08/27/2016 12:29 PM  Result Value Ref Range Status   Specimen Description URINE, CATHETERIZED  Final   Special Requests Normal  Final   Culture   Final    NO GROWTH Performed at [facility], [place] [address], [place], [place] [address]    Report Status 09/12/2016 FINAL  Final  Blood Culture (routine x 2)     Status: None   Collection Time: 09/07/2016 12:51 PM  Result Value Ref Range Status   Specimen Description BLOOD LEFT ARM  Final   Special Requests   Final    BOTTLES DRAWN AEROBIC AND ANAEROBIC Blood Culture adequate volume   Culture NO GROWTH 5 DAYS  Final   Report Status 09/15/2016 FINAL  Final  MRSA PCR Screening     Status: None   Collection Time: 08/22/2016  5:00 PM  Result Value Ref Range Status   MRSA by PCR NEGATIVE NEGATIVE Final    Comment:        The GeneXpert MRSA Assay (FDA approved for NASAL specimens only), is one component of a comprehensive MRSA colonization surveillance program. It is not intended to diagnose MRSA infection nor to guide or monitor treatment for MRSA infections.    Studies/Results: Dg Chest Port 1 View  Result  Date: 09/15/2016 CLINICAL DATA:  Respiratory failure. Follow-up pulmonary edema and effusions. EXAM: PORTABLE CHEST 1 VIEW COMPARISON:  09/14/2016, 09/12/2016 and earlier. FINDINGS: Interval improvement in the interstitial and airspace pulmonary edema since yesterday, though moderate diffuse interstitial edema persists. Improved bilateral pleural effusions, though small effusions persist. Improved aeration in the right lower lobe, with mild passive atelectasis persisting. Stable dense consolidation in the left lower lobe. No new pulmonary parenchymal abnormalities. Cardiac silhouette enlarged but stable. IMPRESSION: 1. Improving pulmonary edema, though moderate interstitial edema persists. 2. Improving bilateral pleural effusions, though small effusions persist. 3. Improved aeration in the right lower lobe, with mild atelectasis persisting. Stable dense left lower lobe atelectasis and/or pneumonia (atelectasis favored). 4. No new abnormalities. Electronically Signed   By: Evangeline Dakin M.D.   On: 09/15/2016 08:19   Dg Chest Port 1 View  Result Date: 09/14/2016 CLINICAL DATA:  83 year old with respiratory failure and worsening chest congestion. Current history of dementia, hypertension, diabetes and COPD. Former smoker. Prior MI. Stage 3 chronic kidney disease. EXAM: PORTABLE CHEST 1 VIEW COMPARISON:  09/12/2016, 09/08/2016 and earlier. FINDINGS: Interval development of severe diffuse interstitial and airspace pulmonary edema, the airspace component in a perihilar distribution. Interval increase in size of bilateral pleural effusions, associated with consolidation in the lower lobes. Cardiac silhouette mildly enlarged, unchanged. IMPRESSION: 1. Severe acute CHF, with severe diffuse interstitial and airspace pulmonary edema. 2. Enlarging bilateral pleural effusions with associated dense passive atelectasis in the lower lobes. Electronically Signed   By: Evangeline Dakin M.D.   On: 09/14/2016 08:08     Medications: I have reviewed the patient's current medications.  Assesment:  Principal Problem:   Sepsis ([place]) Active Problems:   Chronic kidney disease, stage 3   Acute encephalopathy   Lower urinary tract infectious disease   COPD (chronic obstructive pulmonary disease) (HCC)   Acute respiratory failure with hypoxia (HCC)   Diabetes mellitus with nephropathy ([place])    Plan:  Medications reviewed Continue IV antibiotics and IV steroid Will change IV  Fluid to D5 half normal at 75 cc/hr Pulmonary consult appreciated Will do CBC/BMP    LOS: 5 days   , 09/15/2016, 9:55 AM

## 2016-09-16 ENCOUNTER — Inpatient Hospital Stay (HOSPITAL_COMMUNITY): Payer: Medicare Other

## 2016-09-16 ENCOUNTER — Encounter (HOSPITAL_COMMUNITY): Payer: Self-pay | Admitting: Primary Care

## 2016-09-16 DIAGNOSIS — Z7189 Other specified counseling: Secondary | ICD-10-CM

## 2016-09-16 DIAGNOSIS — Z515 Encounter for palliative care: Secondary | ICD-10-CM

## 2016-09-16 LAB — BASIC METABOLIC PANEL
ANION GAP: 12 (ref 5–15)
BUN: 56 mg/dL — ABNORMAL HIGH (ref 6–20)
CHLORIDE: 95 mmol/L — AB (ref 101–111)
CO2: 38 mmol/L — AB (ref 22–32)
CREATININE: 1.53 mg/dL — AB (ref 0.61–1.24)
Calcium: 9.4 mg/dL (ref 8.9–10.3)
GFR calc non Af Amer: 41 mL/min — ABNORMAL LOW (ref >60)
GFR, EST AFRICAN AMERICAN: 47 mL/min — AB (ref >60)
GLUCOSE: 262 mg/dL — AB (ref 65–99)
Potassium: 3.5 mmol/L (ref 3.5–5.1)
Sodium: 145 mmol/L (ref 135–145)

## 2016-09-16 MED ORDER — SODIUM CHLORIDE 0.9 % IV SOLN
INTRAVENOUS | Status: DC
  Administered 2016-09-16: 09:00:00 via INTRAVENOUS

## 2016-09-16 MED ORDER — MORPHINE SULFATE (PF) 2 MG/ML IV SOLN
1.0000 mg | INTRAVENOUS | Status: DC | PRN
  Administered 2016-09-16 (×2): 2 mg via INTRAVENOUS
  Administered 2016-09-16: 4 mg via INTRAVENOUS
  Filled 2016-09-16: qty 2
  Filled 2016-09-16 (×2): qty 1

## 2016-09-16 MED ORDER — MORPHINE SULFATE (CONCENTRATE) 10 MG/0.5ML PO SOLN: 2.5000 mg | ORAL | Status: DC | PRN

## 2016-09-16 NOTE — Consult Note (Signed)
Consultation Note Date: 09/16/2016   Patient Name: [name]  DOB: [birth year]  MRN: [id number]  Age / Sex: 83 y.o., male  PCP: Rosita Fire, MD Referring Physician: Sinda Du, MD  Reason for Consultation: Establishing goals of care and Psychosocial/spiritual support  HPI/Patient Profile: 83 y.o. male  with past medical history of Dementia, diabetes, arteriosclerotic cardiovascular disease, cerebrovascular disease, high blood pressure and cholesterol, COPD, old MI admitted on 09/09/2016 with sepsis secondary to UTI.Marland Kitchen   Clinical Assessment and Goals of Care: [name] is lying quietly in bed. He has BiPAP in place, FI 02 100% with saturation 96%. Verdon Cummins arrives, shares that [name] married his mother 29 years ago. As were talking stepdaughter [name] arrives along with natural son Maddex Garlitz, stepdaughter [name], and sister-in-law [name].  We go to my office for a family meeting. Present is healthcare power of attorney [name], [name], LPN, who is 2nd [place],  son Stephone Gum, and stepdaughter [name].  We talk about [name] chronic health history. We talk about his functional status. They share that he was a resident of [facility] for the last 2 years. They share that they feel he looks, "a lot worse" since a few days ago. We talk about his history of dysphasia. [name] states that he had a swallow study around 2013 which showed weakening of the esophageal muscles. We talk about BiPAP and the use of oxygen. We talk about current medical treatment including IV medications and antibiotics. [name] states that pneumonia used to be known as, "the old persons friend". She talks about dying with grace. We talk about comfort and the use of morphine to help with breathing. I share that signs of discomfort include increased respiratory rate [name] is in the  high 20s to low 30s respiratory rate at this time, increased use of accessory muscles, and increased heart rate. Family is agreement to try the use of morphine for comfort. Family agrees that they would like 24 hours on BiPAP to see if [name] is able to turn around.  I share that I'm worried that he may experience a decline even with BiPAP. Family states understanding. We talk about prognosis with permission. I share that it would not be surprising for [name] to have hours or days with or without BiPAP. I plan a conference with [name] tomorrow afternoon.  Healthcare power of attorney HCPOA - legal guardianship given to step daughter, [name] and sister-in-law, [name].   SUMMARY OF RECOMMENDATIONS   24 hours for outcomes. We discussed the use of BiPAP, and my worry that [name] will continue to have respiratory decline. Family is agreeable to the use of morphine for comfort. We plan for a meeting tomorrow afternoon with stepdaughter, [name].  Code Status/Advance Care Planning:  DNR - we discussed the concepts of allow a natural death. We also discussed the concept of let nature take its course. I share my worry that even with the use of  BiPAP, we will not be able to change what's happening for [name]. I share my concern that he has aspirated.   Symptom Management:   addition of morphine PO/IV for comfort.  Palliative Prophylaxis:   Aspiration and Turn Reposition  Additional Recommendations (Limitations, Scope, Preferences):  24 hours for outcomes. Continue with the use of BiPAP.  Psycho-social/Spiritual:   Desire for further Chaplaincy support:no  Additional Recommendations: Caregiving  Support/Resources and Compassionate Wean Education  Prognosis:   < 2 weeks, would not be surprising based on sudden sharp decline requiring the use of BiPAP at 100% F502, saturation 96%. Families desire to not intubate.  Discharge Planning: To be determined,  in-hospital death would not be surprising.      Primary Diagnoses: Present on Admission: . Sepsis ([place]) . Acute encephalopathy . Acute respiratory failure with hypoxia ([place]) . Chronic kidney disease, stage 3 . COPD (chronic obstructive pulmonary disease) ([place]) . Diabetes mellitus with nephropathy ([place]) . Lower urinary tract infectious disease   I have reviewed the medical record, interviewed the patient and family, and examined the patient. The following aspects are pertinent.  Past Medical History:  Diagnosis Date  . Alcohol abuse, in remission   . Anemia   . ASCVD (arteriosclerotic cardiovascular disease)     Inferior myocardial infarction in 08/1998 requiring PCI of the RCA; moderate residual disease in the left anterior descending and first diagonal; ejection fraction of 45%  . Cerebrovascular disease     Right carotid bruit; plaque without stenosis in 2003 and 2005  . COPD (chronic obstructive pulmonary disease) ([place])   . Dementia   . Diabetes mellitus    A1c-7.7 in 2004 with diet-controlled; 6.3 and 11/08 on oral medication  . Elevated PSA    prior negative prostate biopsy  . Hyperlipidemia   . Hypertension   . Hypothyroid   . MI, old   . Peripheral vascular disease ([place])   . Tobacco abuse    Consumption tapered to 2 packs per week  . Urinary retention    07/2014   Social History   Social History  . Marital status: Widowed    Spouse name: N/A  . Number of children: N/A  . Years of education: N/A   Occupational History  . retired Retired   Social History Main Topics  . Smoking status: Former Smoker    Packs/day: 1.00    Years: 60.00    Types: Cigarettes    Quit date: 04/21/2011  . Smokeless tobacco: Never Used  . Alcohol use No     Comment: Former Abuse  . Drug use: No  . Sexual activity: Not Asked   Other Topics Concern  . None   Social History Narrative  . None   Family History  Problem Relation Age of Onset  . Hypertension Mother   . Heart  disease Father   . Hyperlipidemia Father   . Hypertension Father   . Diabetes Father    Scheduled Meds: . acetylcysteine  3 mL Nebulization QID  . chlorhexidine  15 mL Mouth Rinse BID  . enoxaparin (LOVENOX) injection  40 mg Subcutaneous Q24H  . feeding supplement (ENSURE ENLIVE)  237 mL Oral BID BM  . furosemide  40 mg Intravenous Q12H  . ipratropium-albuterol  3 mL Nebulization Q4H  . levothyroxine  50 mcg Intravenous Daily  . mouth rinse  15 mL Mouth Rinse q12n4p  . pantoprazole (PROTONIX) IV  40 mg Intravenous Q24H  . sodium chloride flush  3 mL Intravenous  Q12H   Continuous Infusions: . sodium chloride 10 mL/hr at 09/16/16 0904  . piperacillin-tazobactam (ZOSYN)  IV 3.375 g (09/16/16 1253)   PRN Meds:.acetaminophen **OR** acetaminophen, albuterol, LORazepam, morphine, morphine injection, ondansetron **OR** ondansetron (ZOFRAN) IV Medications Prior to Admission:  Prior to Admission medications   Medication Sig Start Date End Date Taking? Authorizing Provider  ALPRAZolam Duanne Moron) 1 MG tablet Take one tablet by mouth in the morning and two tablets at bedtime for anxiety/rest Patient taking differently: Take 1-2 mg by mouth 2 (two) times daily. Take one tablet by mouth in the morning and two tablets at bedtime for anxiety/rest 08/07/14  Yes Reed, Tiffany L, DO  aspirin 81 MG EC tablet Take 81 mg by mouth daily.     Yes [provider]  calcium carbonate (ANTACID CALCIUM) 500 MG chewable tablet Chew 2 tablets by mouth 3 (three) times daily with meals.   Yes [provider]  cloNIDine (CATAPRES) 0.1 MG tablet Take 0.1 mg by mouth 2 (two) times daily.  09/22/11  Yes [provider]  famotidine (PEPCID) 20 MG tablet Take 20 mg by mouth daily.   Yes [provider]  ferrous sulfate 325 (65 FE) MG tablet Take 1 tablet (325 mg total) by mouth 2 (two) times daily with a meal. 08/04/14  Yes Black, Lezlie Octave, NP  glipiZIDE (GLUCOTROL) 5 MG tablet Take 5 mg by mouth  daily.    Yes [provider]  levothyroxine (SYNTHROID, LEVOTHROID) 112 MCG tablet Take 112 mcg by mouth daily before breakfast.   Yes [provider]  losartan (COZAAR) 50 MG tablet Take 50 mg by mouth daily.  05/09/14  Yes [provider]  Melatonin 3 MG CAPS Take 6 mg by mouth at bedtime.   Yes [provider]  memantine (NAMENDA) 10 MG tablet Take 10 mg by mouth 2 (two) times daily. 07/21/14  Yes [provider]  metFORMIN (GLUCOPHAGE) 500 MG tablet Take 500 mg by mouth daily.   Yes [provider]  omeprazole (PRILOSEC) 40 MG capsule Take 40 mg by mouth daily.   Yes [provider]  tamsulosin (FLOMAX) 0.4 MG CAPS capsule Take 1 capsule (0.4 mg total) by mouth at bedtime. 08/04/14  Yes Black, Lezlie Octave, NP  albuterol (PROVENTIL HFA;VENTOLIN HFA) 108 (90 Base) MCG/ACT inhaler Inhale 1-2 puffs into the lungs every 6 (six) hours as needed for wheezing or shortness of breath. 04/19/16   Nat Christen, MD  dextromethorphan-guaiFENesin (TUSSIN DM) 10-100 MG/5ML liquid Take 10 mLs by mouth every 4 (four) hours as needed for cough.    [provider]  nitroGLYCERIN (NITROSTAT) 0.4 MG SL tablet Place 0.4 mg under the tongue every 5 (five) minutes as needed for chest pain.     [provider]  senna (SENOKOT) 8.6 MG TABS tablet Take 1 tablet by mouth daily as needed for mild constipation.    [provider]   Allergies  Allergen Reactions  . Prednisone     Doesn't like to take high doses   Review of Systems  Unable to perform ROS: Acuity of condition    Physical Exam  Constitutional: No distress.  Cardiovascular:  Irregular, rate 110s at times  Pulmonary/Chest:  BiPAP, rate 20s to 30s  Abdominal: Soft. He exhibits no distension.  Neurological:  Opens eyes to voice and touch, but does not try to interact. Unable to determine orientation due to use of BiPAP  Skin: Skin is warm and dry.  Nursing note  and vitals  reviewed.   Vital Signs: BP 117/72   Pulse (!) 101   Temp 97.6 F (36.4 C) (Axillary)   Resp (!) 24   Ht 5\' 9"  (1.753 m)   Wt 73.2 kg (161 lb 6 oz)   SpO2 95%   BMI 23.83 kg/m  Pain Assessment: PAINAD POSS *See Group Information*: 1-Acceptable,Awake and alert     SpO2: SpO2: 95 % O2 Device:SpO2: 95 % O2 Flow Rate: .O2 Flow Rate (L/min): 15 L/min  IO: Intake/output summary:  Intake/Output Summary (Last 24 hours) at 09/16/16 1518 Last data filed at 09/16/16 1253  Gross per 24 hour  Intake             1325 ml  Output             3150 ml  Net            -1825 ml    LBM: Last BM Date: 09/15/16 Baseline Weight: Weight: 84.8 kg (186 lb 15.2 oz) Most recent weight: Weight: 73.2 kg (161 lb 6 oz)     Palliative Assessment/Data:   Flowsheet Rows     Most Recent Value  Intake Tab  Referral Department  Hospitalist  Palliative Care Type  New Palliative care  Clinical Assessment  Psychosocial & Spiritual Assessment  Palliative Care Outcomes      Time In: 1400 Time Out: 1520 Time Total: 80 minutes Greater than 50%  of this time was spent counseling and coordinating care related to the above assessment and plan.  Signed by: Drue Novel, NP   Please contact Palliative Medicine Team phone at [phone] for questions and concerns.  For individual provider: See Shea Evans

## 2016-09-16 NOTE — Progress Notes (Signed)
Pharmacy Antibiotic Note  [name] is a 83 y.o. male admitted on 09/09/2016 with sepsis.  Pharmacy has been consulted for Zosyn dosing.  Plan: Continue Zosyn 3.375gm IV every 8 hours. Follow-up micro data, labs, vitals. Consider narrow vs. D/C antibiotics.  Height: 5\' 9"  (175.3 cm) Weight: 161 lb 6 oz (73.2 kg) IBW/kg (Calculated) : 70.7  Temp (24hrs), Avg:98.3 F (36.8 C), Min:97.5 F (36.4 C), Max:98.9 F (37.2 C)   Recent Labs Lab 09/11/2016 1203 09/09/2016 1250 09/12/2016 1643 08/20/2016 1919 09/11/16 0532 09/12/16 0429 09/13/16 0446 09/14/16 0456 09/15/16 0438 09/16/16 0952  WBC 12.7*  --   --   --  7.5 11.8* 13.0*  --   --   --   CREATININE 1.75*  --   --   --  1.81* 1.73* 1.77* 1.85* 1.77* 1.53*  LATICACIDVEN  --  3.04* 2.1* 2.1*  --   --   --   --   --   --     Estimated Creatinine Clearance: 37.9 mL/min (A) (by C-G formula based on SCr of 1.53 mg/dL (H)).    Allergies  Allergen Reactions  . Prednisone     Doesn't like to take high doses   Antimicrobials this admission: Zosyn 8/29 >>  Vanc 8/29 >> 8/29  Dose adjustments this admission: n/a   Microbiology results: 8/29 BCx: (-) 8/29 UCx: (-)   Sputum:   8/29 MRSA PCR: (-)  Thank you for allowing pharmacy to be a part of this patient's care.  Pricilla Larsson 09/16/2016 11:18 AM

## 2016-09-16 NOTE — Care Management Note (Signed)
Case Management Note  Patient Details  Name: PHEONIX CLINKSCALE MRN: [id number] Date of Birth: 1933/03/16   If discussed at [place] Length of Stay Meetings, dates discussed:   09/16/2016 Additional Comments:  Nelline Lio, Chauncey Reading, RN 09/16/2016, 12:10 PM

## 2016-09-16 NOTE — Progress Notes (Signed)
Inpatient Diabetes Program Recommendations  AACE/ADA: New Consensus Statement on Inpatient Glycemic Control (2015)  Target Ranges:  Prepandial:   less than 140 mg/dL      Peak postprandial:   less than 180 mg/dL (1-2 hours)      Critically ill patients:  140 - 180 mg/dL   Results for [name], [name] (MRN [id number]) as of 09/16/2016 10:28  Ref. Range 09/15/2016 08:03 09/15/2016 11:43  Glucose-Capillary Latest Ref Range: 65 - 99 mg/dL 244 (H) 244 (H)    Admit with: Sepsis/ UTI  History: DM, CKD  Home DM Meds: Glipizide 5 mg daily       Metformin 500 mg daily  Current Insulin Orders: None      MD- Please consider placing orders for Novolog Sensitive Correction Scale/ SSI (0-9 units) TID AC + HS      --Will follow patient during hospitalization--  Wyn Quaker RN, MSN, CDE Diabetes Coordinator Inpatient Glycemic Control Team Team Pager: [phone] (8a-5p)

## 2016-09-16 NOTE — Progress Notes (Signed)
Pt's O2 sats staying around 88 on 15 L HFNC. MD notified. Orders received for CXR and bipap. Attempted to notify family of pt's status with no answer. Will continue to monitor.

## 2016-09-16 NOTE — Progress Notes (Signed)
SLP Cancellation Note  Patient Details Name: [name] MRN: [id number] DOB: May 21, 1933   Cancelled treatment:       Reason Eval/Treat Not Completed: Medical issues which prohibited therapy; Pt currently on BiPAP and inappropriate for po intake at this time. SLP will hold therapy today.  Thank you,  Genene Churn, [place]    [place] 09/16/2016, 4:15 PM

## 2016-09-16 NOTE — Progress Notes (Signed)
Subjective: He seems a little more alert. His swallowing was better and it is recommended that he be on a dysphagia diet now.   Objective: Vital signs in last 24 hours: Temp:  [97.5 F (36.4 C)-98.9 F (37.2 C)] 98.3 F (36.8 C) (09/04 0800) Pulse Rate:  [82-160] 85 (09/04 0600) Resp:  [12-31] 16 (09/04 0600) BP: (112-147)/(57-112) 123/71 (09/04 0600) SpO2:  [74 %-93 %] 87 % (09/04 0827) Weight:  [73.2 kg (161 lb 6 oz)] 73.2 kg (161 lb 6 oz) (09/04 0500) Weight change: -2.4 kg (-5 lb 4.7 oz) Last BM Date: 09/15/16  Intake/Output from previous day: 09/03 0701 - 09/04 0700 In: 2225 [I.V.:2025; IV Piggyback:200] Out: 3150 [Urine:3150]  PHYSICAL EXAM General appearance: Sleepy but arousable. Generally much more alert Resp: Significantly clearer than yesterday Cardio: regular rate and rhythm, S1, S2 normal, no murmur, click, rub or gallop GI: soft, non-tender; bowel sounds normal; no masses,  no organomegaly Extremities: extremities normal, atraumatic, no cyanosis or edema Skin warm and dry  Lab Results:  Results for orders placed or performed during the hospital encounter of 08/15/2016 (from the past 48 hour(s))  Glucose, capillary     Status: Abnormal   Collection Time: 09/14/16 11:15 AM  Result Value Ref Range   Glucose-Capillary 173 (H) 65 - 99 mg/dL  Glucose, capillary     Status: Abnormal   Collection Time: 09/14/16  4:30 PM  Result Value Ref Range   Glucose-Capillary 211 (H) 65 - 99 mg/dL  Glucose, capillary     Status: Abnormal   Collection Time: 09/14/16  7:29 PM  Result Value Ref Range   Glucose-Capillary 236 (H) 65 - 99 mg/dL   Comment 1 Notify RN   Glucose, capillary     Status: Abnormal   Collection Time: 09/14/16 11:27 PM  Result Value Ref Range   Glucose-Capillary 278 (H) 65 - 99 mg/dL   Comment 1 Notify RN   Comprehensive metabolic panel     Status: Abnormal   Collection Time: 09/15/16  4:38 AM  Result Value Ref Range   Sodium 145 135 - 145 mmol/L    Potassium 3.5 3.5 - 5.1 mmol/L   Chloride 99 (L) 101 - 111 mmol/L   CO2 35 (H) 22 - 32 mmol/L   Glucose, Bld 249 (H) 65 - 99 mg/dL   BUN 58 (H) 6 - 20 mg/dL   Creatinine, Ser 1.77 (H) 0.61 - 1.24 mg/dL   Calcium 9.5 8.9 - 10.3 mg/dL   Total Protein 6.6 6.5 - 8.1 g/dL   Albumin 2.8 (L) 3.5 - 5.0 g/dL   AST 21 15 - 41 U/L   ALT 18 17 - 63 U/L   Alkaline Phosphatase 46 38 - 126 U/L   Total Bilirubin 0.7 0.3 - 1.2 mg/dL   GFR calc non Af Amer 34 (L) >60 mL/min   GFR calc Af Amer 40 (L) >60 mL/min    Comment: (NOTE) The eGFR has been calculated using the CKD EPI equation. This calculation has not been validated in all clinical situations. eGFR's persistently <60 mL/min signify possible Chronic Kidney Disease.    Anion gap 11 5 - 15  Glucose, capillary     Status: Abnormal   Collection Time: 09/15/16  8:03 AM  Result Value Ref Range   Glucose-Capillary 244 (H) 65 - 99 mg/dL  Glucose, capillary     Status: Abnormal   Collection Time: 09/15/16 11:43 AM  Result Value Ref Range   Glucose-Capillary 244 (H) 65 -  99 mg/dL    ABGS No results for input(s): PHART, PO2ART, TCO2, HCO3 in the last 72 hours.  Invalid input(s): PCO2 CULTURES Recent Results (from the past 240 hour(s))  Blood Culture (routine x 2)     Status: None   Collection Time: 08/29/2016 12:28 PM  Result Value Ref Range Status   Specimen Description BLOOD RIGHT ARM DRAWN BY RN  Final   Special Requests   Final    Blood Culture results may not be optimal due to an inadequate volume of blood received in culture bottles   Culture NO GROWTH 5 DAYS  Final   Report Status 09/15/2016 FINAL  Final  Urine culture     Status: None   Collection Time: 09/01/2016 12:29 PM  Result Value Ref Range Status   Specimen Description URINE, CATHETERIZED  Final   Special Requests Normal  Final   Culture   Final    NO GROWTH Performed at [facility], [place] [address] [place], [place] [address]    Report Status 09/12/2016 FINAL  Final   Blood Culture (routine x 2)     Status: None   Collection Time: 09/01/2016 12:51 PM  Result Value Ref Range Status   Specimen Description BLOOD LEFT ARM  Final   Special Requests   Final    BOTTLES DRAWN AEROBIC AND ANAEROBIC Blood Culture adequate volume   Culture NO GROWTH 5 DAYS  Final   Report Status 09/15/2016 FINAL  Final  MRSA PCR Screening     Status: None   Collection Time: 08/26/2016  5:00 PM  Result Value Ref Range Status   MRSA by PCR NEGATIVE NEGATIVE Final    Comment:        The GeneXpert MRSA Assay (FDA approved for NASAL specimens only), is one component of a comprehensive MRSA colonization surveillance program. It is not intended to diagnose MRSA infection nor to guide or monitor treatment for MRSA infections.    Studies/Results: Dg Chest Port 1 View  Result Date: 09/15/2016 CLINICAL DATA:  Respiratory failure. Follow-up pulmonary edema and effusions. EXAM: PORTABLE CHEST 1 VIEW COMPARISON:  09/14/2016, 09/12/2016 and earlier. FINDINGS: Interval improvement in the interstitial and airspace pulmonary edema since yesterday, though moderate diffuse interstitial edema persists. Improved bilateral pleural effusions, though small effusions persist. Improved aeration in the right lower lobe, with mild passive atelectasis persisting. Stable dense consolidation in the left lower lobe. No new pulmonary parenchymal abnormalities. Cardiac silhouette enlarged but stable. IMPRESSION: 1. Improving pulmonary edema, though moderate interstitial edema persists. 2. Improving bilateral pleural effusions, though small effusions persist. 3. Improved aeration in the right lower lobe, with mild atelectasis persisting. Stable dense left lower lobe atelectasis and/or pneumonia (atelectasis favored). 4. No new abnormalities. Electronically Signed   By: Evangeline Dakin M.D.   On: 09/15/2016 08:19    Medications:  Prior to Admission:  Prescriptions Prior to Admission  Medication Sig Dispense  Refill Last Dose  . ALPRAZolam (XANAX) 1 MG tablet Take one tablet by mouth in the morning and two tablets at bedtime for anxiety/rest (Patient taking differently: Take 1-2 mg by mouth 2 (two) times daily. Take one tablet by mouth in the morning and two tablets at bedtime for anxiety/rest) 90 tablet 5 08/30/2016 at Unknown time  . aspirin 81 MG EC tablet Take 81 mg by mouth daily.     08/25/2016 at Unknown time  . calcium carbonate (ANTACID CALCIUM) 500 MG chewable tablet Chew 2 tablets by mouth 3 (three) times daily  with meals.   08/22/2016 at Unknown time  . cloNIDine (CATAPRES) 0.1 MG tablet Take 0.1 mg by mouth 2 (two) times daily.    09/08/2016 at Unknown time  . famotidine (PEPCID) 20 MG tablet Take 20 mg by mouth daily.   08/29/2016 at Unknown time  . ferrous sulfate 325 (65 FE) MG tablet Take 1 tablet (325 mg total) by mouth 2 (two) times daily with a meal.  3 08/24/2016 at Unknown time  . glipiZIDE (GLUCOTROL) 5 MG tablet Take 5 mg by mouth daily.    09/11/2016 at Unknown time  . levothyroxine (SYNTHROID, LEVOTHROID) 112 MCG tablet Take 112 mcg by mouth daily before breakfast.   08/14/2016 at Unknown time  . losartan (COZAAR) 50 MG tablet Take 50 mg by mouth daily.   10 09/01/2016 at Unknown time  . Melatonin 3 MG CAPS Take 6 mg by mouth at bedtime.   09/09/2016 at Unknown time  . memantine (NAMENDA) 10 MG tablet Take 10 mg by mouth 2 (two) times daily.  5 09/09/2016 at Unknown time  . metFORMIN (GLUCOPHAGE) 500 MG tablet Take 500 mg by mouth daily.   08/30/2016 at Unknown time  . omeprazole (PRILOSEC) 40 MG capsule Take 40 mg by mouth daily.   09/05/2016 at Unknown time  . tamsulosin (FLOMAX) 0.4 MG CAPS capsule Take 1 capsule (0.4 mg total) by mouth at bedtime. 30 capsule 0 09/09/2016 at Unknown time  . albuterol (PROVENTIL HFA;VENTOLIN HFA) 108 (90 Base) MCG/ACT inhaler Inhale 1-2 puffs into the lungs every 6 (six) hours as needed for wheezing or shortness of breath. 1 Inhaler 0 unknown  .  dextromethorphan-guaiFENesin (TUSSIN DM) 10-100 MG/5ML liquid Take 10 mLs by mouth every 4 (four) hours as needed for cough.   unknown  . nitroGLYCERIN (NITROSTAT) 0.4 MG SL tablet Place 0.4 mg under the tongue every 5 (five) minutes as needed for chest pain.    unknown  . senna (SENOKOT) 8.6 MG TABS tablet Take 1 tablet by mouth daily as needed for mild constipation.   unknown   Scheduled: . acetylcysteine  3 mL Nebulization QID  . chlorhexidine  15 mL Mouth Rinse BID  . enoxaparin (LOVENOX) injection  40 mg Subcutaneous Q24H  . feeding supplement (ENSURE ENLIVE)  237 mL Oral BID BM  . furosemide  40 mg Intravenous Q12H  . ipratropium-albuterol  3 mL Nebulization Q4H  . levothyroxine  50 mcg Intravenous Daily  . mouth rinse  15 mL Mouth Rinse q12n4p  . methylPREDNISolone (SOLU-MEDROL) injection  40 mg Intravenous Q12H  . pantoprazole (PROTONIX) IV  40 mg Intravenous Q24H  . sodium chloride flush  3 mL Intravenous Q12H   Continuous: . dextrose 5 % and 0.45% NaCl 75 mL/hr at 09/15/16 1853  . piperacillin-tazobactam (ZOSYN)  IV Stopped (09/16/16 4193)   XTK:[id number] **OR** acetaminophen, albuterol, LORazepam, ondansetron **OR** ondansetron (ZOFRAN) IV  Assesment: He was admitted with sepsis. He has what is a urinary tract infection. He has COPD at baseline. He has what seems to be aspiration pneumonia. He has diabetes and his blood sugar is not totally controlled. He had problems with pulmonary edema and that is remarkably improved now. Principal Problem:   Sepsis ([place]) Active Problems:   Chronic kidney disease, stage 3   Acute encephalopathy   Lower urinary tract infectious disease   COPD (chronic obstructive pulmonary disease) ([facility])   Acute respiratory failure with hypoxia ([facility])   Diabetes mellitus with nephropathy ([place])    Plan: Transfer from  ICU    LOS: 6 days   Calvyn Kurtzman L 09/16/2016, 8:41 AM

## 2016-09-17 DIAGNOSIS — IMO0002 Reserved for concepts with insufficient information to code with codable children: Secondary | ICD-10-CM | POA: Diagnosis present

## 2016-09-17 DIAGNOSIS — I509 Heart failure, unspecified: Secondary | ICD-10-CM

## 2016-09-17 DIAGNOSIS — E1165 Type 2 diabetes mellitus with hyperglycemia: Secondary | ICD-10-CM | POA: Diagnosis present

## 2016-09-17 LAB — BASIC METABOLIC PANEL
Anion gap: 12 (ref 5–15)
BUN: 72 mg/dL — AB (ref 6–20)
CHLORIDE: 97 mmol/L — AB (ref 101–111)
CO2: 40 mmol/L — ABNORMAL HIGH (ref 22–32)
CREATININE: 2.14 mg/dL — AB (ref 0.61–1.24)
Calcium: 9.5 mg/dL (ref 8.9–10.3)
GFR calc non Af Amer: 27 mL/min — ABNORMAL LOW (ref >60)
GFR, EST AFRICAN AMERICAN: 32 mL/min — AB (ref >60)
Glucose, Bld: 216 mg/dL — ABNORMAL HIGH (ref 65–99)
POTASSIUM: 3.6 mmol/L (ref 3.5–5.1)
SODIUM: 149 mmol/L — AB (ref 135–145)

## 2016-09-17 MED ORDER — SODIUM CHLORIDE 0.9 % IV SOLN
1.0000 mg/h | INTRAVENOUS | Status: DC
  Administered 2016-09-17: 1 mg/h via INTRAVENOUS
  Filled 2016-09-17: qty 10

## 2016-09-17 MED ORDER — IPRATROPIUM-ALBUTEROL 0.5-2.5 (3) MG/3ML IN SOLN: 3.0000 mL | RESPIRATORY_TRACT | Status: DC | PRN

## 2016-09-17 MED ORDER — ENOXAPARIN SODIUM 30 MG/0.3ML ~~LOC~~ SOLN: 30.0000 mg | SUBCUTANEOUS | Status: DC

## 2016-09-17 MED ORDER — LORAZEPAM 2 MG/ML IJ SOLN: 1.0000 mg | INTRAMUSCULAR | Status: DC | PRN

## 2016-09-17 NOTE — Progress Notes (Signed)
Daily Progress Note   Patient Name: [name]       Date: 09/17/2016 DOB: September 12, 1933  Age: 83 y.o. MRN#: [id number] Attending Physician: Sinda Du, MD Primary Care Physician: Rosita Fire, MD Admit Date: 08/24/2016  Reason for Consultation/Follow-up: Establishing goals of care, Psychosocial/spiritual support and Withdrawal of life-sustaining treatment  Subjective: [name] is resting quietly in bed, surrounded by his family. He has medication for pain and anxiety. Family is electing to unburden [name] from BiPAP. We talk about the likelihood that his time will be short, considering that he is on 90% oxygen with saturation in the mid-90s. Family understands that they are providing comfort and dignity at end-of-life. We discuss the use of medications for pain and symptom management. We discuss unburdening [name] from medications and are changing what's happening. He is surrounded by his stepdaughter/HCP 08, [name], his sister-in-law all, 2nd POA, Johnathan Desanctis, LPN, his son [name], his stepdaughter [name], also extended family. No further questions or concerns at this time.  Length of Stay: 7  Current Medications: Scheduled Meds:  . mouth rinse  15 mL Mouth Rinse q12n4p  . sodium chloride flush  3 mL Intravenous Q12H    Continuous Infusions: . sodium chloride 10 mL/hr at 09/17/16 1500  . morphine 1 mg/hr (09/17/16 1500)    PRN Meds: acetaminophen **OR** acetaminophen, ipratropium-albuterol, LORazepam, ondansetron **OR** ondansetron (ZOFRAN) IV  Physical Exam  Constitutional: No distress.  HENT:  Head: Atraumatic.  Cardiovascular: Normal rate.   Pulmonary/Chest:  Relatively comfortable on 2 L nasal cannula  Abdominal: Soft. He exhibits no  distension.  Musculoskeletal: He exhibits no edema.  Neurological:  Opens eyes, but does not try to make needs known  Skin: Skin is warm and dry.  Nursing note and vitals reviewed.           Vital Signs: BP 113/68   Pulse 96   Temp 98 F (36.7 C) (Axillary)   Resp 13   Ht 5\' 9"  (1.753 m)   Wt 73.8 kg (162 lb 11.2 oz)   SpO2 96%   BMI 24.03 kg/m  SpO2: SpO2: 96 % O2 Device: O2 Device: Bi-PAP O2 Flow Rate: O2 Flow Rate (L/min): 15 L/min  Intake/output summary:  Intake/Output Summary (Last 24 hours) at 09/17/16 1708 Last data filed at  09/17/16 1634  Gross per 24 hour  Intake           446.75 ml  Output              250 ml  Net           196.75 ml   LBM: Last BM Date: 09/15/16 Baseline Weight: Weight: 84.8 kg (186 lb 15.2 oz) Most recent weight: Weight: 73.8 kg (162 lb 11.2 oz)       Palliative Assessment/Data:    Flowsheet Rows     Most Recent Value  Intake Tab  Referral Department  Hospitalist  Unit at Time of Referral  ICU  Palliative Care Primary Diagnosis  Sepsis/Infectious Disease  Date Notified  09/16/16  Palliative Care Type  New Palliative care  Reason for referral  Clarify Goals of Care  Date of Admission  09/08/2016  Date first seen by Palliative Care  09/16/16  # of days Palliative referral response time  0 Day(s)  # of days IP prior to Palliative referral  6  Clinical Assessment  Palliative Performance Scale Score  30%  Pain Max last 24 hours  Not able to report  Pain Min Last 24 hours  Not able to report  Dyspnea Max Last 24 Hours  Not able to report  Dyspnea Min Last 24 hours  Not able to report  Psychosocial & Spiritual Assessment  Palliative Care Outcomes  Patient/Family meeting held?  Yes  Who was at the meeting?  SIL [name], step dau [name], son [name], step dau [name]  Provided advance care planning, Provided psychosocial or spiritual support, Clarified goals of care  Patient/Family wishes:  Interventions discontinued/not started   Mechanical Ventilation      Patient Active Problem List   Diagnosis Date Noted  . Uncontrolled diabetes mellitus ([place]) 09/17/2016  . Acute heart failure ([place]) 09/17/2016  . Palliative care encounter   . Goals of care, counseling/discussion   . DNR (do not resuscitate) discussion   . Sepsis ([place]) 08/30/2016  . CKD (chronic kidney disease), stage III 06/11/2015  . Chronic respiratory failure with hypoxia ([place]) 06/11/2015  . Elevated troponin I level 06/11/2015  . Diabetes mellitus with nephropathy ([place]) 06/11/2015  . Senile dementia with delirium 06/10/2015  . Benzodiazepine withdrawal ([place]) 06/10/2015  . Confusion   . Urinary retention 08/03/2014  . Hypercalcemia 08/02/2014  . Dehydration 08/02/2014  . Lower urinary tract infectious disease 08/02/2014  . Subacute delirium 08/02/2014  . COPD (chronic obstructive pulmonary disease) ([place]) 08/02/2014  . Acute respiratory failure with hypoxia ([place]) 08/02/2014  . Elevated AST (SGOT) 08/02/2014  . Acute encephalopathy 06/11/2013  . Hypoxia 02/12/2013  . Chronic kidney disease, stage 3 03/01/2012  . Peripheral vascular disease ([place]) 06/12/2010  . ASCVD (arteriosclerotic cardiovascular disease)   . Cerebrovascular disease   . Diabetes mellitus type 2 with complications ([place])   . Tobacco abuse   . Elevated PSA   . Alcohol abuse, in remission   . Anemia, normocytic normochromic 05/22/2010  . Hypertension 05/13/2010  . HYPERLIPIDEMIA 01/09/2010  . Hypothyroidism 07/18/2008    Palliative Care Assessment & Plan   Patient Profile: 83 y.o. male  with past medical history of Dementia, diabetes, arteriosclerotic cardiovascular disease, cerebrovascular disease, high blood pressure and cholesterol, COPD, old MI admitted on 09/04/2016 with sepsis secondary to UTI.Marland Kitchen   Assessment: Sepsis secondary to UTI; initially treated, but sudden decline 9/4 requiring use of BiPAP. Family elects comfort measures  only, unburdened from medications that are changing outcomes. likely aspiration pneumonia/respiratory failure; sudden decline 9/4 requiring use of BiPAP with 100% FI 02, saturation of low to mid 90s. Family meeting, family decides compassionate removal of BiPAP 9/5 for comfort and dignity at end-of-life.  Recommendations/Plan:  full comfort measures at this time. Morphine continuous infusion.  Goals of Care and Additional Recommendations:  Limitations on Scope of Treatment: Full Comfort Care  Code Status:    Code Status Orders        Start     Ordered   09/11/16 1510  Do not attempt resuscitation (DNR)  Continuous    Question Answer Comment  In the event of cardiac or respiratory ARREST Do not call a "code blue"   In the event of cardiac or respiratory ARREST Do not perform Intubation, CPR, defibrillation or ACLS   In the event of cardiac or respiratory ARREST Use medication by any route, position, wound care, and other measures to relive pain and suffering. May use oxygen, suction and manual treatment of airway obstruction as needed for comfort.      09/11/16 1510    Code Status History    Date Active Date Inactive Code Status Order ID Comments User Context   08/18/2016  4:34 PM 09/11/2016  3:10 PM Full Code [id number]  Samuella Cota, MD Inpatient   06/10/2015  6:39 AM 06/12/2015  7:52 PM DNR [id number]  Orvan Falconer, MD Inpatient   08/02/2014  3:23 AM 08/04/2014  7:16 PM Full Code [id number]  Jani Gravel, MD Inpatient   06/11/2013  5:25 AM 06/13/2013  7:57 PM Full Code [id number]  Orvan Falconer, MD Inpatient       Prognosis:   Hours - Days  Discharge Planning:  Anticipated Hospital Death  Care plan was discussed with nursing staff, case manager, social worker, and Dr. Luan Pulling on next rounds.  Thank you for allowing the Palliative Medicine Team to assist in the care of this patient.   Time In: 1440  Time Out: 1520  Total Time 40 minutes  Prolonged Time Billed  no       Greater  than 50%  of this time was spent counseling and coordinating care related to the above assessment and plan.  Drue Novel, NP  Please contact Palliative Medicine Team phone at [phone] for questions and concerns.

## 2016-09-17 NOTE — Progress Notes (Signed)
SLP Cancellation Note  Patient Details Name: [name] MRN: [id number] DOB: 1933/12/26   Cancelled treatment:       Reason Eval/Treat Not Completed: Medical issues which prohibited therapy ; Pt continues with decline in medical status and still on BiPAP with likely transition to comfort. SLP will sign off at this point. Reconsult as indicated.  Thank you,  Genene Churn, [place]   [place] 09/17/2016, 9:00 AM

## 2016-09-17 NOTE — Progress Notes (Signed)
Present with family for emotional and spiritual support. Mr. Rubinstein family, friends and Doristine Bosworth were present when I left.

## 2016-09-17 NOTE — Progress Notes (Addendum)
Subjective: He is significantly worse. When I saw him on early rounds yesterday he was doing reasonably well more alert and able to talk to me a little bit. He can answer yes or no. However around noon he had a sudden turn is now on BiPAP on 90% oxygen with fair oxygenation. I think he almost certainly aspirated again. Family has met with palliative care nurse practitioner and plans are to discontinue BiPAP later today when the rest of the family is available. They understand his very poor prognosis  Objective: Vital signs in last 24 hours: Temp:  [97.5 F (36.4 C)-98.6 F (37 C)] 98 F (36.7 C) (09/05 0747) Pulse Rate:  [84-126] 93 (09/05 0747) Resp:  [11-33] 13 (09/05 0747) BP: (86-137)/(58-84) 96/65 (09/05 0700) SpO2:  [85 %-99 %] 98 % (09/05 0747) FiO2 (%):  [90 %] 90 % (09/05 0339) Weight:  [73.8 kg (162 lb 11.2 oz)] 73.8 kg (162 lb 11.2 oz) (09/05 0500) Weight change: 0.6 kg (1 lb 5.2 oz) Last BM Date: 09/15/16  Intake/Output from previous day: 09/04 0701 - 09/05 0700 In: 379.3 [I.V.:179.3; IV Piggyback:200] Out: 1050 [Urine:1050]  PHYSICAL EXAM General appearance: Sedated on BiPAP Resp: rhonchi bilaterally Cardio: regular rate and rhythm, S1, S2 normal, no murmur, click, rub or gallop GI: soft, non-tender; bowel sounds normal; no masses,  no organomegaly Extremities: extremities normal, atraumatic, no cyanosis or edema Skin turgor fair  Lab Results:  Results for orders placed or performed during the hospital encounter of 08/19/2016 (from the past 48 hour(s))  Glucose, capillary     Status: Abnormal   Collection Time: 09/15/16  8:03 AM  Result Value Ref Range   Glucose-Capillary 244 (H) 65 - 99 mg/dL  Glucose, capillary     Status: Abnormal   Collection Time: 09/15/16 11:43 AM  Result Value Ref Range   Glucose-Capillary 244 (H) 65 - 99 mg/dL  Basic metabolic panel     Status: Abnormal   Collection Time: 09/16/16  9:52 AM  Result Value Ref Range   Sodium 145 135 - 145  mmol/L   Potassium 3.5 3.5 - 5.1 mmol/L   Chloride 95 (L) 101 - 111 mmol/L   CO2 38 (H) 22 - 32 mmol/L   Glucose, Bld 262 (H) 65 - 99 mg/dL   BUN 56 (H) 6 - 20 mg/dL   Creatinine, Ser 1.53 (H) 0.61 - 1.24 mg/dL   Calcium 9.4 8.9 - 10.3 mg/dL   GFR calc non Af Amer 41 (L) >60 mL/min   GFR calc Af Amer 47 (L) >60 mL/min    Comment: (NOTE) The eGFR has been calculated using the CKD EPI equation. This calculation has not been validated in all clinical situations. eGFR's persistently <60 mL/min signify possible Chronic Kidney Disease.    Anion gap 12 5 - 15  Basic metabolic panel     Status: Abnormal   Collection Time: 09/17/16  5:44 AM  Result Value Ref Range   Sodium 149 (H) 135 - 145 mmol/L   Potassium 3.6 3.5 - 5.1 mmol/L   Chloride 97 (L) 101 - 111 mmol/L   CO2 40 (H) 22 - 32 mmol/L   Glucose, Bld 216 (H) 65 - 99 mg/dL   BUN 72 (H) 6 - 20 mg/dL   Creatinine, Ser 2.14 (H) 0.61 - 1.24 mg/dL   Calcium 9.5 8.9 - 10.3 mg/dL   GFR calc non Af Amer 27 (L) >60 mL/min   GFR calc Af Amer 32 (L) >60 mL/min  Comment: (NOTE) The eGFR has been calculated using the CKD EPI equation. This calculation has not been validated in all clinical situations. eGFR's persistently <60 mL/min signify possible Chronic Kidney Disease.    Anion gap 12 5 - 15    ABGS No results for input(s): PHART, PO2ART, TCO2, HCO3 in the last 72 hours.  Invalid input(s): PCO2 CULTURES Recent Results (from the past 240 hour(s))  Blood Culture (routine x 2)     Status: None   Collection Time: 08/24/2016 12:28 PM  Result Value Ref Range Status   Specimen Description BLOOD RIGHT ARM DRAWN BY RN  Final   Special Requests   Final    Blood Culture results may not be optimal due to an inadequate volume of blood received in culture bottles   Culture NO GROWTH 5 DAYS  Final   Report Status 09/15/2016 FINAL  Final  Urine culture     Status: None   Collection Time: 09/07/2016 12:29 PM  Result Value Ref Range Status    Specimen Description URINE, CATHETERIZED  Final   Special Requests Normal  Final   Culture   Final    NO GROWTH Performed at [facility], [address], [place], [place] [address]    Report Status 09/12/2016 FINAL  Final  Blood Culture (routine x 2)     Status: None   Collection Time: 08/15/2016 12:51 PM  Result Value Ref Range Status   Specimen Description BLOOD LEFT ARM  Final   Special Requests   Final    BOTTLES DRAWN AEROBIC AND ANAEROBIC Blood Culture adequate volume   Culture NO GROWTH 5 DAYS  Final   Report Status 09/15/2016 FINAL  Final  MRSA PCR Screening     Status: None   Collection Time: 09/04/2016  5:00 PM  Result Value Ref Range Status   MRSA by PCR NEGATIVE NEGATIVE Final    Comment:        The GeneXpert MRSA Assay (FDA approved for NASAL specimens only), is one component of a comprehensive MRSA colonization surveillance program. It is not intended to diagnose MRSA infection nor to guide or monitor treatment for MRSA infections.    Studies/Results: Dg Chest Port 1 View  Result Date: 09/16/2016 CLINICAL DATA:  Respiratory distress EXAM: PORTABLE CHEST 1 VIEW COMPARISON:  09/15/2016 FINDINGS: Normal mediastinum and cardiac silhouette. Some improvement in perihilar airspace disease seen on comparison radiograph. Persistent RIGHT lower lobe airspace disease pain LEFT perihilar airspace disease. Dense LEFT basilar atelectasis. IMPRESSION: Improvement in bilateral airspace disease suggests improving pulmonary edema. Electronically Signed   By: Genevive Bi M.D.   On: 09/16/2016 13:49    Medications:  Prior to Admission:  Prescriptions Prior to Admission  Medication Sig Dispense Refill Last Dose  . ALPRAZolam (XANAX) 1 MG tablet Take one tablet by mouth in the morning and two tablets at bedtime for anxiety/rest (Patient taking differently: Take 1-2 mg by mouth 2 (two) times daily. Take one tablet by mouth in the morning and two tablets at bedtime for  anxiety/rest) 90 tablet 5 08/18/2016 at Unknown time  . aspirin 81 MG EC tablet Take 81 mg by mouth daily.     08/23/2016 at Unknown time  . calcium carbonate (ANTACID CALCIUM) 500 MG chewable tablet Chew 2 tablets by mouth 3 (three) times daily with meals.   08/19/2016 at Unknown time  . cloNIDine (CATAPRES) 0.1 MG tablet Take 0.1 mg by mouth 2 (two) times daily.    09/07/2016 at Unknown  time  . famotidine (PEPCID) 20 MG tablet Take 20 mg by mouth daily.   08/19/2016 at Unknown time  . ferrous sulfate 325 (65 FE) MG tablet Take 1 tablet (325 mg total) by mouth 2 (two) times daily with a meal.  3 08/29/2016 at Unknown time  . glipiZIDE (GLUCOTROL) 5 MG tablet Take 5 mg by mouth daily.    08/16/2016 at Unknown time  . levothyroxine (SYNTHROID, LEVOTHROID) 112 MCG tablet Take 112 mcg by mouth daily before breakfast.   09/05/2016 at Unknown time  . losartan (COZAAR) 50 MG tablet Take 50 mg by mouth daily.   10 08/22/2016 at Unknown time  . Melatonin 3 MG CAPS Take 6 mg by mouth at bedtime.   09/09/2016 at Unknown time  . memantine (NAMENDA) 10 MG tablet Take 10 mg by mouth 2 (two) times daily.  5 09/09/2016 at Unknown time  . metFORMIN (GLUCOPHAGE) 500 MG tablet Take 500 mg by mouth daily.   08/17/2016 at Unknown time  . omeprazole (PRILOSEC) 40 MG capsule Take 40 mg by mouth daily.   08/30/2016 at Unknown time  . tamsulosin (FLOMAX) 0.4 MG CAPS capsule Take 1 capsule (0.4 mg total) by mouth at bedtime. 30 capsule 0 09/09/2016 at Unknown time  . albuterol (PROVENTIL HFA;VENTOLIN HFA) 108 (90 Base) MCG/ACT inhaler Inhale 1-2 puffs into the lungs every 6 (six) hours as needed for wheezing or shortness of breath. 1 Inhaler 0 unknown  . dextromethorphan-guaiFENesin (TUSSIN DM) 10-100 MG/5ML liquid Take 10 mLs by mouth every 4 (four) hours as needed for cough.   unknown  . nitroGLYCERIN (NITROSTAT) 0.4 MG SL tablet Place 0.4 mg under the tongue every 5 (five) minutes as needed for chest pain.    unknown  . senna  (SENOKOT) 8.6 MG TABS tablet Take 1 tablet by mouth daily as needed for mild constipation.   unknown   Scheduled: . acetylcysteine  3 mL Nebulization QID  . chlorhexidine  15 mL Mouth Rinse BID  . enoxaparin (LOVENOX) injection  40 mg Subcutaneous Q24H  . feeding supplement (ENSURE ENLIVE)  237 mL Oral BID BM  . furosemide  40 mg Intravenous Q12H  . ipratropium-albuterol  3 mL Nebulization Q4H  . levothyroxine  50 mcg Intravenous Daily  . mouth rinse  15 mL Mouth Rinse q12n4p  . pantoprazole (PROTONIX) IV  40 mg Intravenous Q24H  . sodium chloride flush  3 mL Intravenous Q12H   Continuous: . sodium chloride 10 mL/hr at 09/16/16 1500  . piperacillin-tazobactam (ZOSYN)  IV Stopped (09/17/16 0939)   ZOX:[id number] **OR** acetaminophen, albuterol, LORazepam, morphine injection, morphine CONCENTRATE, ondansetron **OR** ondansetron (ZOFRAN) IV  Assesment: He was admitted with sepsis presumably from urinary tract infection but also has aspiration pneumonia. I think he aspirated again yesterday. He had acute hypoxic respiratory failure and he got much worse yesterday and is now on BiPAP. His family has met with palliative care nurse practitioner and understand his multiple medical problems and poor prognosis and plan is to discontinue BiPAP. This will occur one more family is present.He has uncontrolled diabetes likely related to his previously having been on IV dextrose plus steroids plus stress but I don't plan to treat that considering his situation. Diabetic coordinator note seen and appreciated yesterday but under the circumstances will not pursue treatment.He had acute heart failure but I can't tell if it's systolic or diastolic as his last echocardiogram was in 2010 and that was actually an exercise echocardiogram done for diagnostic purposes. At  that time he had normal systolic function. I am not going to pursue echocardiogram now Principal Problem:   Sepsis ([place]) Active Problems:    Chronic kidney disease, stage 3   Acute encephalopathy   Lower urinary tract infectious disease   COPD (chronic obstructive pulmonary disease) ([facility])   Acute respiratory failure with hypoxia ([facility])   Diabetes mellitus with nephropathy ([place])   Palliative care encounter   Goals of care, counseling/discussion   DNR (do not resuscitate) discussion    Plan: Discontinue BiPAP later this morning. He is already on morphine I will have morphine continuous infusion available.He has diabetes and his blood sugars have been running around 200 but I don't plan to treat that at this point    LOS: 7 days   Chaunce Winkels L 09/17/2016, 7:59 AM

## 2016-09-18 DIAGNOSIS — J9690 Respiratory failure, unspecified, unspecified whether with hypoxia or hypercapnia: Secondary | ICD-10-CM

## 2016-10-13 NOTE — Care Management Note (Signed)
Case Management Note  Patient Details  Name: OTTIS VACHA MRN: [id number] Date of Birth: [birth year]   If discussed at [place] Length of Stay Meetings, dates discussed:   10/18/2016 Additional Comments:  Danniella Robben, Chauncey [place], RN 2016-10-18, 12:07 PM

## 2016-10-13 NOTE — Progress Notes (Signed)
Daily Progress Note   Patient Name: [name]       Date: Oct 02, 2016 DOB: 1933-05-24  Age: 83 y.o. MRN#: [id number] Attending Physician: Sinda Du, MD Primary Care Physician: Rosita Fire, MD Admit Date: 09/02/2016  Reason for Consultation/Follow-up: Establishing goals of care, Psychosocial/spiritual support and Terminal Care  Subjective: [name] is lying quietly in bed. He is comfortable with continuous morphine infusion. He does not respond to voice her touch. He is unable to fully close his eyes at this time. Present at bedside is Dr. [name]. No questions or concerns at this time. I returned later in the morning for support. Sister-in-law [name] is present. No additional questions or concerns at this time.  Conference with nursing staff related to patient care and comfort.  Length of Stay: 8  Current Medications: Scheduled Meds:  . mouth rinse  15 mL Mouth Rinse q12n4p  . sodium chloride flush  3 mL Intravenous Q12H    Continuous Infusions: . sodium chloride 10 mL/hr at 09/17/16 1500  . morphine 5 mg/hr (10/02/2016 1323)    PRN Meds: acetaminophen **OR** acetaminophen, ipratropium-albuterol, LORazepam, ondansetron **OR** ondansetron (ZOFRAN) IV  Physical Exam  Constitutional: No distress.  Continuous infusions of morphine, comfort measures, appears comfortable  HENT:  Head: Atraumatic.  Cardiovascular: Normal rate.   Pulmonary/Chest: Effort normal. No respiratory distress.  Abdominal: Soft. He exhibits no distension.  Musculoskeletal: He exhibits no edema.  Neurological:  Sedated with continuous morphine infusion for comfort  Skin: Skin is warm and dry.  Nursing note and vitals reviewed.           Vital Signs: BP 95/62   Pulse 91   Temp 97.9  F (36.6 C) (Axillary)   Resp 12   Ht 5\' 9"  (1.753 m)   Wt 73.8 kg (162 lb 11.2 oz)   SpO2 (!) 86%   BMI 24.03 kg/m  SpO2: SpO2: (!) 86 % O2 Device: O2 Device: Nasal Cannula O2 Flow Rate: O2 Flow Rate (L/min): 2 L/min  Intake/output summary:  Intake/Output Summary (Last 24 hours) at Oct 02, 2016 1407 Last data filed at 10-02-16 0911  Gross per 24 hour  Intake           211.42 ml  Output              200  ml  Net            11.42 ml   LBM: Last BM Date: 09/15/16 Baseline Weight: Weight: 84.8 kg (186 lb 15.2 oz) Most recent weight: Weight: 73.8 kg (162 lb 11.2 oz)       Palliative Assessment/Data:    Flowsheet Rows     Most Recent Value  Intake Tab  Referral Department  Hospitalist  Unit at Time of Referral  ICU  Palliative Care Primary Diagnosis  Sepsis/Infectious Disease  Date Notified  09/16/16  Palliative Care Type  New Palliative care  Reason for referral  Clarify Goals of Care  Date of Admission  09/12/2016  Date first seen by Palliative Care  09/16/16  # of days Palliative referral response time  0 Day(s)  # of days IP prior to Palliative referral  6  Clinical Assessment  Palliative Performance Scale Score  30%  Pain Max last 24 hours  Not able to report  Pain Min Last 24 hours  Not able to report  Dyspnea Max Last 24 Hours  Not able to report  Dyspnea Min Last 24 hours  Not able to report  Psychosocial & Spiritual Assessment  Palliative Care Outcomes  Patient/Family meeting held?  Yes  Who was at the meeting?  SIL [name], step dau [name], son [name], step dau [name]  Provided advance care planning, Provided psychosocial or spiritual support, Clarified goals of care  Patient/Family wishes: Interventions discontinued/not started   Mechanical Ventilation      Patient Active Problem List   Diagnosis Date Noted  . Uncontrolled diabetes mellitus ([place]) 09/17/2016  . Acute heart failure ([place]) 09/17/2016  . Palliative  care encounter   . Goals of care, counseling/discussion   . DNR (do not resuscitate) discussion   . Sepsis ([place]) 08/30/2016  . CKD (chronic kidney disease), stage III 06/11/2015  . Chronic respiratory failure with hypoxia ([place]) 06/11/2015  . Elevated troponin I level 06/11/2015  . Diabetes mellitus with nephropathy ([place]) 06/11/2015  . Senile dementia with delirium 06/10/2015  . Benzodiazepine withdrawal ([place]) 06/10/2015  . Confusion   . Urinary retention 08/03/2014  . Hypercalcemia 08/02/2014  . Dehydration 08/02/2014  . Lower urinary tract infectious disease 08/02/2014  . Subacute delirium 08/02/2014  . COPD (chronic obstructive pulmonary disease) ([place]) 08/02/2014  . Acute respiratory failure with hypoxia ([place]) 08/02/2014  . Elevated AST (SGOT) 08/02/2014  . Acute encephalopathy 06/11/2013  . Hypoxia 02/12/2013  . Chronic kidney disease, stage 3 03/01/2012  . Peripheral vascular disease ([place]) 06/12/2010  . ASCVD (arteriosclerotic cardiovascular disease)   . Cerebrovascular disease   . Diabetes mellitus type 2 with complications ([place])   . Tobacco abuse   . Elevated PSA   . Alcohol abuse, in remission   . Anemia, normocytic normochromic 05/22/2010  . Hypertension 05/13/2010  . HYPERLIPIDEMIA 01/09/2010  . Hypothyroidism 07/18/2008    Palliative Care Assessment & Plan   Patient Profile: 83 y.o.malewith past medical history of Dementia, diabetes, arteriosclerotic cardiovascular disease, cerebrovascular disease, high blood pressure and cholesterol, COPD, old MIadmitted on 8/29/2018with sepsis secondary to UTI.Marland Kitchen   Assessment: Sepsis secondary to UTI; initially treated, but sudden decline 9/4 requiring use of BiPAP. Family elects comfort measures only, unburdened from medications that are changing outcomes. likely aspiration pneumonia/respiratory failure; sudden decline 9/4 requiring use of BiPAP with 100% FI 02, saturation of low to mid 90s. Family meeting, family decides  compassionate removal of BiPAP 9/5 for comfort  and dignity at end-of-life. Continuous morphine infusion. Imminent death.  Recommendations/Plan:  full comfort measures at this time. Morphine continuous infusion. Anticipate in hospital death today.  Goals of Care and Additional Recommendations:  Limitations on Scope of Treatment: Full Comfort Care  Code Status:    Code Status Orders        Start     Ordered   09/11/16 1510  Do not attempt resuscitation (DNR)  Continuous    Question Answer Comment  In the event of cardiac or respiratory ARREST Do not call a "code blue"   In the event of cardiac or respiratory ARREST Do not perform Intubation, CPR, defibrillation or ACLS   In the event of cardiac or respiratory ARREST Use medication by any route, position, wound care, and other measures to relive pain and suffering. May use oxygen, suction and manual treatment of airway obstruction as needed for comfort.      09/11/16 1510    Code Status History    Date Active Date Inactive Code Status Order ID Comments User Context   09/06/2016  4:34 PM 09/11/2016  3:10 PM Full Code [id number]  Samuella Cota, MD Inpatient   06/10/2015  6:39 AM 06/12/2015  7:52 PM DNR [id number]  Orvan Falconer, MD Inpatient   08/02/2014  3:23 AM 08/04/2014  7:16 PM Full Code [id number]  Jani Gravel, MD Inpatient   06/11/2013  5:25 AM 06/13/2013  7:57 PM Full Code [id number]  Orvan Falconer, MD Inpatient       Prognosis:   Hours - Days  Discharge Planning:  Anticipated Hospital Death  Care plan was discussed with nursing staff, case manager, social worker, and Dr. Luan Pulling on next rounds.  Thank you for allowing the Palliative Medicine Team to assist in the care of this patient.   Time In: 1115  Time Out: 1140  Total Time 25 minutes  Prolonged Time Billed  no       Greater than 50%  of this time was spent counseling and coordinating care related to the above assessment and plan.  Drue Novel, NP  Please contact  Palliative Medicine Team phone at [phone] for questions and concerns.

## 2016-10-13 NOTE — Progress Notes (Signed)
Subjective: He is on full comfort measures. He has morphine continuous infusion going. Monitors are off. Family at bedside says he's had a comfortable night.  Objective: Vital signs in last 24 hours: Temp:  [97.9 F (36.6 C)-98 F (36.7 C)] 97.9 F (36.6 C) (09/06 0400) Pulse Rate:  [71-98] 91 (09/06 0400) Resp:  [12-20] 12 (09/06 0400) BP: (95-121)/(60-68) 95/62 (09/06 0051) SpO2:  [82 %-100 %] 86 % (09/06 0400) FiO2 (%):  [90 %] 90 % (09/05 0813) Weight change:  Last BM Date: 09/15/16  Intake/Output from previous day: 09/05 0701 - 09/06 0700 In: 261.4 [I.V.:161.4; IV Piggyback:100] Out: 200 [Urine:200]  PHYSICAL EXAM General appearance: Sleeping soundly. Nasal oxygen in place Resp: Bilateral rhonchi. He is breathing slowly Cardio: Mildly tachycardic GI: soft, non-tender; bowel sounds normal; no masses,  no organomegaly Extremities: extremities normal, atraumatic, no cyanosis or edema  Lab Results:  Results for orders placed or performed during the hospital encounter of 08/24/2016 (from the past 48 hour(s))  Basic metabolic panel     Status: Abnormal   Collection Time: 09/16/16  9:52 AM  Result Value Ref Range   Sodium 145 135 - 145 mmol/L   Potassium 3.5 3.5 - 5.1 mmol/L   Chloride 95 (L) 101 - 111 mmol/L   CO2 38 (H) 22 - 32 mmol/L   Glucose, Bld 262 (H) 65 - 99 mg/dL   BUN 56 (H) 6 - 20 mg/dL   Creatinine, Ser 1.53 (H) 0.61 - 1.24 mg/dL   Calcium 9.4 8.9 - 10.3 mg/dL   GFR calc non Af Amer 41 (L) >60 mL/min   GFR calc Af Amer 47 (L) >60 mL/min    Comment: (NOTE) The eGFR has been calculated using the CKD EPI equation. This calculation has not been validated in all clinical situations. eGFR's persistently <60 mL/min signify possible Chronic Kidney Disease.    Anion gap 12 5 - 15  Basic metabolic panel     Status: Abnormal   Collection Time: 09/17/16  5:44 AM  Result Value Ref Range   Sodium 149 (H) 135 - 145 mmol/L   Potassium 3.6 3.5 - 5.1 mmol/L   Chloride  97 (L) 101 - 111 mmol/L   CO2 40 (H) 22 - 32 mmol/L   Glucose, Bld 216 (H) 65 - 99 mg/dL   BUN 72 (H) 6 - 20 mg/dL   Creatinine, Ser 2.14 (H) 0.61 - 1.24 mg/dL   Calcium 9.5 8.9 - 10.3 mg/dL   GFR calc non Af Amer 27 (L) >60 mL/min   GFR calc Af Amer 32 (L) >60 mL/min    Comment: (NOTE) The eGFR has been calculated using the CKD EPI equation. This calculation has not been validated in all clinical situations. eGFR's persistently <60 mL/min signify possible Chronic Kidney Disease.    Anion gap 12 5 - 15    ABGS No results for input(s): PHART, PO2ART, TCO2, HCO3 in the last 72 hours.  Invalid input(s): PCO2 CULTURES Recent Results (from the past 240 hour(s))  Blood Culture (routine x 2)     Status: None   Collection Time: 08/17/2016 12:28 PM  Result Value Ref Range Status   Specimen Description BLOOD RIGHT ARM DRAWN BY RN  Final   Special Requests   Final    Blood Culture results may not be optimal due to an inadequate volume of blood received in culture bottles   Culture NO GROWTH 5 DAYS  Final   Report Status 09/15/2016 FINAL  Final  Urine culture     Status: None   Collection Time: 08/15/2016 12:29 PM  Result Value Ref Range Status   Specimen Description URINE, CATHETERIZED  Final   Special Requests Normal  Final   Culture   Final    NO GROWTH Performed at [facility], [address] [place], [place] [address]    Report Status 09/12/2016 FINAL  Final  Blood Culture (routine x 2)     Status: None   Collection Time: 08/16/2016 12:51 PM  Result Value Ref Range Status   Specimen Description BLOOD LEFT ARM  Final   Special Requests   Final    BOTTLES DRAWN AEROBIC AND ANAEROBIC Blood Culture adequate volume   Culture NO GROWTH 5 DAYS  Final   Report Status 09/15/2016 FINAL  Final  MRSA PCR Screening     Status: None   Collection Time: 08/19/2016  5:00 PM  Result Value Ref Range Status   MRSA by PCR NEGATIVE NEGATIVE Final    Comment:        The GeneXpert MRSA Assay  (FDA approved for NASAL specimens only), is one component of a comprehensive MRSA colonization surveillance program. It is not intended to diagnose MRSA infection nor to guide or monitor treatment for MRSA infections.    Studies/Results: Dg Chest Port 1 View  Result Date: 09/16/2016 CLINICAL DATA:  Respiratory distress EXAM: PORTABLE CHEST 1 VIEW COMPARISON:  09/15/2016 FINDINGS: Normal mediastinum and cardiac silhouette. Some improvement in perihilar airspace disease seen on comparison radiograph. Persistent RIGHT lower lobe airspace disease pain LEFT perihilar airspace disease. Dense LEFT basilar atelectasis. IMPRESSION: Improvement in bilateral airspace disease suggests improving pulmonary edema. Electronically Signed   By: Suzy Bouchard M.D.   On: 09/16/2016 13:49    Medications:  Prior to Admission:  Prescriptions Prior to Admission  Medication Sig Dispense Refill Last Dose  . ALPRAZolam (XANAX) 1 MG tablet Take one tablet by mouth in the morning and two tablets at bedtime for anxiety/rest (Patient taking differently: Take 1-2 mg by mouth 2 (two) times daily. Take one tablet by mouth in the morning and two tablets at bedtime for anxiety/rest) 90 tablet 5 08/21/2016 at Unknown time  . aspirin 81 MG EC tablet Take 81 mg by mouth daily.     08/16/2016 at Unknown time  . calcium carbonate (ANTACID CALCIUM) 500 MG chewable tablet Chew 2 tablets by mouth 3 (three) times daily with meals.   08/20/2016 at Unknown time  . cloNIDine (CATAPRES) 0.1 MG tablet Take 0.1 mg by mouth 2 (two) times daily.    08/23/2016 at Unknown time  . famotidine (PEPCID) 20 MG tablet Take 20 mg by mouth daily.   09/03/2016 at Unknown time  . ferrous sulfate 325 (65 FE) MG tablet Take 1 tablet (325 mg total) by mouth 2 (two) times daily with a meal.  3 08/20/2016 at Unknown time  . glipiZIDE (GLUCOTROL) 5 MG tablet Take 5 mg by mouth daily.    08/20/2016 at Unknown time  . levothyroxine (SYNTHROID, LEVOTHROID) 112 MCG  tablet Take 112 mcg by mouth daily before breakfast.   08/13/2016 at Unknown time  . losartan (COZAAR) 50 MG tablet Take 50 mg by mouth daily.   10 09/01/2016 at Unknown time  . Melatonin 3 MG CAPS Take 6 mg by mouth at bedtime.   09/09/2016 at Unknown time  . memantine (NAMENDA) 10 MG tablet Take 10 mg by mouth 2 (two) times daily.  5 08/25/2016 at Unknown  time  . metFORMIN (GLUCOPHAGE) 500 MG tablet Take 500 mg by mouth daily.   09/02/2016 at Unknown time  . omeprazole (PRILOSEC) 40 MG capsule Take 40 mg by mouth daily.   09/07/2016 at Unknown time  . tamsulosin (FLOMAX) 0.4 MG CAPS capsule Take 1 capsule (0.4 mg total) by mouth at bedtime. 30 capsule 0 09/09/2016 at Unknown time  . albuterol (PROVENTIL HFA;VENTOLIN HFA) 108 (90 Base) MCG/ACT inhaler Inhale 1-2 puffs into the lungs every 6 (six) hours as needed for wheezing or shortness of breath. 1 Inhaler 0 unknown  . dextromethorphan-guaiFENesin (TUSSIN DM) 10-100 MG/5ML liquid Take 10 mLs by mouth every 4 (four) hours as needed for cough.   unknown  . nitroGLYCERIN (NITROSTAT) 0.4 MG SL tablet Place 0.4 mg under the tongue every 5 (five) minutes as needed for chest pain.    unknown  . senna (SENOKOT) 8.6 MG TABS tablet Take 1 tablet by mouth daily as needed for mild constipation.   unknown   Scheduled: . mouth rinse  15 mL Mouth Rinse q12n4p  . sodium chloride flush  3 mL Intravenous Q12H   Continuous: . sodium chloride 10 mL/hr at 09/17/16 1500  . morphine 4 mg/hr (10-18-2016 0200)   WUJ:[id number] **OR** acetaminophen, ipratropium-albuterol, LORazepam, ondansetron **OR** ondansetron (ZOFRAN) IV  Assesment: He was admitted with sepsis and acute encephalopathy. He has also aspirated. He developed acute heart failure. He has acute hypoxic respiratory failure. He's has encephalopathy in addition to his dementia. Comfort measures are underway. Principal Problem:   Sepsis ([place]) Active Problems:   Chronic kidney disease, stage 3   Acute  encephalopathy   Lower urinary tract infectious disease   COPD (chronic obstructive pulmonary disease) ([facility])   Acute respiratory failure with hypoxia ([facility])   Diabetes mellitus with nephropathy ([place])   Palliative care encounter   Goals of care, counseling/discussion   DNR (do not resuscitate) discussion   Uncontrolled diabetes mellitus ([place])   Acute heart failure ([place])    Plan: Anticipate death in the next 24 hours    LOS: 8 days   Dylon Correa L Oct 18, 2016, 8:05 AM

## 2016-10-13 NOTE — Discharge Summary (Signed)
Physician Discharge Summary  Patient ID: [name] MRN: [id number] DOB/AGE: 09-19-1933 83 y.o. Primary Care Physician:Fanta, Tesfaye, MD Admit date: 08/19/2016 Discharge date: 09/19/2016    Discharge Diagnoses:   Principal Problem:   Sepsis [facility]) Active Problems:   Chronic kidney disease, stage 3   Acute encephalopathy   Lower urinary tract infectious disease   COPD (chronic obstructive pulmonary disease) (HCC)   Respiratory failure (HCC)   Diabetes mellitus with nephropathy ([place])   Palliative care encounter   Goals of care, counseling/discussion   DNR (do not resuscitate) discussion   Uncontrolled diabetes mellitus ([place])   Acute heart failure ([place]) Pulmonary edema Aspiration pneumonia  Allergies as of 2016-10-16      Reactions   Prednisone    Doesn't like to take high doses      Medication List    ASK your doctor about these medications   albuterol 108 (90 Base) MCG/ACT inhaler Commonly known as:  PROVENTIL HFA;VENTOLIN HFA Inhale 1-2 puffs into the lungs every 6 (six) hours as needed for wheezing or shortness of breath.   ALPRAZolam 1 MG tablet Commonly known as:  XANAX Take one tablet by mouth in the morning and two tablets at bedtime for anxiety/rest   ANTACID CALCIUM 500 MG chewable tablet Generic drug:  calcium carbonate Chew 2 tablets by mouth 3 (three) times daily with meals.   aspirin 81 MG EC tablet Take 81 mg by mouth daily.   cloNIDine 0.1 MG tablet Commonly known as:  CATAPRES Take 0.1 mg by mouth 2 (two) times daily.   famotidine 20 MG tablet Commonly known as:  PEPCID Take 20 mg by mouth daily.   ferrous sulfate 325 (65 FE) MG tablet Take 1 tablet (325 mg total) by mouth 2 (two) times daily with a meal.   glipiZIDE 5 MG tablet Commonly known as:  GLUCOTROL Take 5 mg by mouth daily.   levothyroxine 112 MCG tablet Commonly known as:  SYNTHROID, LEVOTHROID Take 112 mcg by mouth daily before breakfast.   losartan 50 MG tablet Commonly  known as:  COZAAR Take 50 mg by mouth daily.   Melatonin 3 MG Caps Take 6 mg by mouth at bedtime.   memantine 10 MG tablet Commonly known as:  NAMENDA Take 10 mg by mouth 2 (two) times daily.   metFORMIN 500 MG tablet Commonly known as:  GLUCOPHAGE Take 500 mg by mouth daily.   nitroGLYCERIN 0.4 MG SL tablet Commonly known as:  NITROSTAT Place 0.4 mg under the tongue every 5 (five) minutes as needed for chest pain.   omeprazole 40 MG capsule Commonly known as:  PRILOSEC Take 40 mg by mouth daily.   senna 8.6 MG Tabs tablet Commonly known as:  SENOKOT Take 1 tablet by mouth daily as needed for mild constipation.   tamsulosin 0.4 MG Caps capsule Commonly known as:  FLOMAX Take 1 capsule (0.4 mg total) by mouth at bedtime.   TUSSIN DM 10-100 MG/5ML liquid Generic drug:  dextromethorphan-guaiFENesin Take 10 mLs by mouth every 4 (four) hours as needed for cough.       Discharged Condition:Deceased    Consults: Pulmonary  Significant Diagnostic Studies: Dg Chest 1 View  Result Date: 09/12/2016 CLINICAL DATA:  Acute respiratory failure with hypoxia . COPD. Lower urinary tract infections. Chronic kidney disease stage 3 EXAM: CHEST 1 VIEW COMPARISON:  09/11/2016 FINDINGS: Decreased lung volumes noted. Cardiomegaly again demonstrated. Increased airspace disease seen in both lower lobes. Bilateral pleural effusions cannot be excluded.  IMPRESSION: Decreased lung volumes and increased bilateral lower lobe airspace disease. Pleural effusions cannot be excluded. Electronically Signed   By: Earle Gell M.D.   On: 09/12/2016 07:42   Dg Chest 1 View  Result Date: 09/04/2016 CLINICAL DATA:  High fever and confusion.  Cough and weakness. EXAM: CHEST 1 VIEW COMPARISON:  06/24/2016. FINDINGS: Trachea is midline, given patient rotation. Heart size stable. Thoracic aorta is calcified. Coronary stent is noted. Probable mild scarring in the right midlung zone. Lungs are otherwise clear. No  pleural fluid. IMPRESSION: 1. No acute findings. 2.  Aortic atherosclerosis (ICD10-170.0). Electronically Signed   By: Lorin Picket M.D.   On: 08/19/2016 12:54   Dg Esophagus  Result Date: 09/11/2016 CLINICAL DATA:  Dysphagia. EXAM: ESOPHOGRAM/BARIUM SWALLOW TECHNIQUE: Single contrast examination was performed using  thin barium. FLUOROSCOPY TIME:  Fluoroscopy Time:  0 minutes 24 seconds and Radiation Exposure Index (if provided by the fluoroscopic device): 9.7 mGy Number of Acquired Spot Images: 6 COMPARISON:  Chest x-ray 08/14/2016. FINDINGS: Patient was brought down from the ICU for barium swallow. Chest reveals right lower lobe infiltrate. Thin barium was administered to the patient. Patient could only swallow small amount. The esophagus is widely patent. No obstructing abnormality. No reflux noted. IMPRESSION: 1. Limited exam. The esophagus is widely patent. No obstructing abnormality identified. 2.  Right lower lobe infiltrate Electronically Signed   By: Marcello Moores  Register   On: 09/11/2016 15:00   US Venous Img Lower Unilateral Right  Result Date: 09/11/2016 CLINICAL DATA:  83 year old male with right lower extremity edema. EXAM: RIGHT LOWER EXTREMITY VENOUS DOPPLER ULTRASOUND TECHNIQUE: Gray-scale sonography with graded compression, as well as color Doppler and duplex ultrasound were performed to evaluate the lower extremity deep venous systems from the level of the common femoral vein and including the common femoral, femoral, profunda femoral, popliteal and calf veins including the posterior tibial, peroneal and gastrocnemius veins when visible. The superficial great saphenous vein was also interrogated. Spectral Doppler was utilized to evaluate flow at rest and with distal augmentation maneuvers in the common femoral, femoral and popliteal veins. COMPARISON:  09/08/2012 FINDINGS: Please note, this is a slightly limited study due to patient's altered mental status. Contralateral Common Femoral  Vein: Respiratory phasicity is normal and symmetric with the symptomatic side. No evidence of thrombus. Normal compressibility. Common Femoral Vein: No evidence of thrombus. Normal compressibility, respiratory phasicity and response to augmentation. Saphenofemoral Junction: No evidence of thrombus. Normal compressibility and flow on color Doppler imaging. Profunda Femoral Vein: No evidence of thrombus. Normal compressibility and flow on color Doppler imaging. Femoral Vein: No evidence of thrombus. Normal compressibility, respiratory phasicity and response to augmentation. Popliteal Vein: No evidence of thrombus. Normal compressibility, respiratory phasicity and response to augmentation. Calf Veins: No evidence of thrombus. Normal compressibility and flow on color Doppler imaging. Superficial Great Saphenous Vein: No evidence of thrombus. Normal compressibility and flow on color Doppler imaging. Venous Reflux:  None. Other Findings:  None. IMPRESSION: No evidence of DVT within the right lower extremity. Electronically Signed   By: Kristopher Oppenheim M.D.   On: 09/11/2016 13:35   Dg Chest Port 1 View  Result Date: 09/16/2016 CLINICAL DATA:  Respiratory distress EXAM: PORTABLE CHEST 1 VIEW COMPARISON:  09/15/2016 FINDINGS: Normal mediastinum and cardiac silhouette. Some improvement in perihilar airspace disease seen on comparison radiograph. Persistent RIGHT lower lobe airspace disease pain LEFT perihilar airspace disease. Dense LEFT basilar atelectasis. IMPRESSION: Improvement in bilateral airspace disease suggests improving pulmonary edema. Electronically Signed  By: Suzy Bouchard M.D.   On: 09/16/2016 13:49   Dg Chest Port 1 View  Result Date: 09/15/2016 CLINICAL DATA:  Respiratory failure. Follow-up pulmonary edema and effusions. EXAM: PORTABLE CHEST 1 VIEW COMPARISON:  09/14/2016, 09/12/2016 and earlier. FINDINGS: Interval improvement in the interstitial and airspace pulmonary edema since yesterday, though  moderate diffuse interstitial edema persists. Improved bilateral pleural effusions, though small effusions persist. Improved aeration in the right lower lobe, with mild passive atelectasis persisting. Stable dense consolidation in the left lower lobe. No new pulmonary parenchymal abnormalities. Cardiac silhouette enlarged but stable. IMPRESSION: 1. Improving pulmonary edema, though moderate interstitial edema persists. 2. Improving bilateral pleural effusions, though small effusions persist. 3. Improved aeration in the right lower lobe, with mild atelectasis persisting. Stable dense left lower lobe atelectasis and/or pneumonia (atelectasis favored). 4. No new abnormalities. Electronically Signed   By: Evangeline Dakin M.D.   On: 09/15/2016 08:19   Dg Chest Port 1 View  Result Date: 09/14/2016 CLINICAL DATA:  83 year old with respiratory failure and worsening chest congestion. Current history of dementia, hypertension, diabetes and COPD. Former smoker. Prior MI. Stage 3 chronic kidney disease. EXAM: PORTABLE CHEST 1 VIEW COMPARISON:  09/12/2016, 09/09/2016 and earlier. FINDINGS: Interval development of severe diffuse interstitial and airspace pulmonary edema, the airspace component in a perihilar distribution. Interval increase in size of bilateral pleural effusions, associated with consolidation in the lower lobes. Cardiac silhouette mildly enlarged, unchanged. IMPRESSION: 1. Severe acute CHF, with severe diffuse interstitial and airspace pulmonary edema. 2. Enlarging bilateral pleural effusions with associated dense passive atelectasis in the lower lobes. Electronically Signed   By: Evangeline Dakin M.D.   On: 09/14/2016 08:08   Dg Chest Port 1 View  Result Date: 09/05/2016 CLINICAL DATA:  Hypoxia.  Abdominal distention. EXAM: PORTABLE CHEST 1 VIEW COMPARISON:  Radiographs earlier today and 06/24/2016. FINDINGS: 1633 hour. The patient's mandible overlies the upper chest. There is mild patient rotation to the  right. The heart size and mediastinal contours are stable. There is aortic atherosclerosis. There is increased interstitial prominence in both lungs, likely reflecting edema. No confluent airspace opacity, pneumothorax or significant pleural effusion. IMPRESSION: Interval increased interstitial prominence in both lungs, most likely due to edema. Fever and confusion for reported on earlier study, and these findings could be due to early aspiration. Electronically Signed   By: Richardean Sale M.D.   On: 09/02/2016 17:09   Dg Abd Portable 1v  Result Date: 08/30/2016 CLINICAL DATA:  Hypoxia and abdominal distention. EXAM: PORTABLE ABDOMEN - 1 VIEW COMPARISON:  Abdominal CT 05/26/2014. FINDINGS: 1638 hours. Two supine views are submitted. There is gas throughout the small and large bowel. No significant bowel distension, bowel wall thickening or supine evidence of free intraperitoneal air demonstrated. There is aortoiliac atherosclerosis. No acute osseous findings are seen. IMPRESSION: No radiographic evidence of acute abdominal process. Aortic atherosclerosis. Electronically Signed   By: Richardean Sale M.D.   On: 09/08/2016 17:11    Lab Results: Basic Metabolic Panel:  Recent Labs  09/16/16 0952 09/17/16 0544  NA 145 149*  K 3.5 3.6  CL 95* 97*  CO2 38* 40*  GLUCOSE 262* 216*  BUN 56* 72*  CREATININE 1.53* 2.14*  CALCIUM 9.4 9.5   Liver Function Tests: No results for input(s): AST, ALT, ALKPHOS, BILITOT, PROT, ALBUMIN in the last 72 hours.   CBC: No results for input(s): WBC, NEUTROABS, HGB, HCT, MCV, PLT in the last 72 hours.  Recent Results (from the past 240 hour(s))  Blood Culture (routine x 2)     Status: None   Collection Time: 08/30/2016 12:28 PM  Result Value Ref Range Status   Specimen Description BLOOD RIGHT ARM DRAWN BY RN  Final   Special Requests   Final    Blood Culture results may not be optimal due to an inadequate volume of blood received in culture bottles   Culture  NO GROWTH 5 DAYS  Final   Report Status 09/15/2016 FINAL  Final  Urine culture     Status: None   Collection Time: 08/23/2016 12:29 PM  Result Value Ref Range Status   Specimen Description URINE, CATHETERIZED  Final   Special Requests Normal  Final   Culture   Final    NO GROWTH Performed at [facility], [place] [address] [place],  [address]    Report Status 09/12/2016 FINAL  Final  Blood Culture (routine x 2)     Status: None   Collection Time: 08/28/2016 12:51 PM  Result Value Ref Range Status   Specimen Description BLOOD LEFT ARM  Final   Special Requests   Final    BOTTLES DRAWN AEROBIC AND ANAEROBIC Blood Culture adequate volume   Culture NO GROWTH 5 DAYS  Final   Report Status 09/15/2016 FINAL  Final  MRSA PCR Screening     Status: None   Collection Time: 08/31/2016  5:00 PM  Result Value Ref Range Status   MRSA by PCR NEGATIVE NEGATIVE Final    Comment:        The GeneXpert MRSA Assay (FDA approved for NASAL specimens only), is one component of a comprehensive MRSA colonization surveillance program. It is not intended to diagnose MRSA infection nor to guide or monitor treatment for MRSA infections.      Hospital Course: This was an 83 year old who came to the emergency department with altered mental status shortness of breath and fever. He was found to have a urinary tract infection and was septic. He was treated for sepsis with fluid resuscitation and antibiotics. Later it was found that he had also had aspiration pneumonia. He had waxing and waning mental status. He had a lot of difficulty swallowing. He had a up-and-down hospital course had improved somewhat and then clearly aspirated again with severe respiratory failure at that point. He had pulmonary edema probably from fluid resuscitation but he may also have some element of heart failure although he has not had a recent echocardiogram. He was treated with Lasix. When he aspirated again he was started on  BiPAP. Palliative care consultation was obtained and his family revealed that he had DO NOT RESUSCITATE status and that he did not want to continue suffering. It was elected to give him a short course of BiPAP to allow the family to see him and to say goodbye and then transition to comfort care which was done. He died about 24 hours later.  Discharge Exam: Blood pressure 95/62, pulse 98, temperature 97.9 F (36.6 C), temperature source Axillary, resp. rate (!) 25, height 5\' 9"  (1.753 m), weight 73.8 kg (162 lb 11.2 oz), SpO2 (!) 77 %. Not applicable  Disposition: Released to funeral home      Signed: Ramonia Mcclaran L   09/19/2016, 8:56 AM

## 2016-10-13 NOTE — Clinical Social Work Note (Signed)
Patient is on comfort care.       [name], Johnathan Pugh, LCSW

## 2016-10-13 NOTE — Progress Notes (Signed)
Patient expired at 1450 today. Family at bedside. [name] notified, patient not a candidate referral number [id number]. Bed placement notified.

## 2016-10-13 NOTE — Progress Notes (Signed)
1107mL remaining in Morphine gtt bag wasted in the sink witnessed by Loni Muse, RN.

## 2016-10-13 DEATH — deceased

## 2023-02-14 DEATH — deceased
